# Patient Record
Sex: Female | Born: 2000 | Race: White | Hispanic: No | Marital: Single | Smoking: Former smoker
Health system: Southern US, Community
[De-identification: ages and names within clinical notes are randomized; demographics above are authoritative.]

## PROBLEM LIST (undated history)

## (undated) ENCOUNTER — Inpatient Hospital Stay (HOSPITAL_COMMUNITY): Payer: Self-pay

## (undated) DIAGNOSIS — B9562 Methicillin resistant Staphylococcus aureus infection as the cause of diseases classified elsewhere: Secondary | ICD-10-CM

## (undated) DIAGNOSIS — I10 Essential (primary) hypertension: Secondary | ICD-10-CM

## (undated) DIAGNOSIS — L0390 Cellulitis, unspecified: Secondary | ICD-10-CM

## (undated) DIAGNOSIS — F32A Depression, unspecified: Secondary | ICD-10-CM

## (undated) DIAGNOSIS — K219 Gastro-esophageal reflux disease without esophagitis: Secondary | ICD-10-CM

## (undated) DIAGNOSIS — T8859XA Other complications of anesthesia, initial encounter: Secondary | ICD-10-CM

## (undated) DIAGNOSIS — E209 Hypoparathyroidism, unspecified: Secondary | ICD-10-CM

## (undated) DIAGNOSIS — T4145XA Adverse effect of unspecified anesthetic, initial encounter: Secondary | ICD-10-CM

## (undated) DIAGNOSIS — E876 Hypokalemia: Secondary | ICD-10-CM

## (undated) DIAGNOSIS — E119 Type 2 diabetes mellitus without complications: Secondary | ICD-10-CM

## (undated) DIAGNOSIS — F329 Major depressive disorder, single episode, unspecified: Secondary | ICD-10-CM

## (undated) DIAGNOSIS — Z8489 Family history of other specified conditions: Secondary | ICD-10-CM

## (undated) DIAGNOSIS — G629 Polyneuropathy, unspecified: Secondary | ICD-10-CM

## (undated) DIAGNOSIS — I499 Cardiac arrhythmia, unspecified: Secondary | ICD-10-CM

## (undated) DIAGNOSIS — F419 Anxiety disorder, unspecified: Secondary | ICD-10-CM

## (undated) HISTORY — PX: OTHER SURGICAL HISTORY: SHX169

## (undated) HISTORY — PX: TYMPANOSTOMY TUBE PLACEMENT: SHX32

## (undated) HISTORY — PX: WISDOM TOOTH EXTRACTION: SHX21

## (undated) HISTORY — PX: CHOLECYSTECTOMY: SHX55

## (undated) HISTORY — DX: Hypokalemia: E87.6

## (undated) HISTORY — PX: ADENOIDECTOMY: SUR15

## (undated) HISTORY — PX: ESOPHAGOGASTRODUODENOSCOPY: SHX1529

## (undated) HISTORY — DX: Hypomagnesemia: E83.42

## (undated) HISTORY — DX: Cellulitis, unspecified: L03.90

## (undated) HISTORY — DX: Methicillin resistant Staphylococcus aureus infection as the cause of diseases classified elsewhere: B95.62

## (undated) HISTORY — DX: Polyneuropathy, unspecified: G62.9

## (undated) HISTORY — DX: Major depressive disorder, single episode, unspecified: F32.9

## (undated) HISTORY — PX: TOE SURGERY: SHX1073

## (undated) HISTORY — PX: COLONOSCOPY: SHX174

---
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
## 2020-01-09 ENCOUNTER — Other Ambulatory Visit: Payer: Self-pay | Admitting: Pediatrics

## 2020-01-09 MED ORDER — SERTRALINE HCL 50 MG PO TABS: 50.0000 mg | ORAL_TABLET | Freq: Every day | ORAL | 2 refills | Status: DC

## 2020-01-10 ENCOUNTER — Other Ambulatory Visit: Payer: Self-pay | Admitting: Pediatrics

## 2020-01-10 DIAGNOSIS — G479 Sleep disorder, unspecified: Secondary | ICD-10-CM

## 2020-01-15 ENCOUNTER — Ambulatory Visit: Payer: Medicaid Other | Admitting: Pediatrics

## 2020-01-16 ENCOUNTER — Ambulatory Visit (INDEPENDENT_AMBULATORY_CARE_PROVIDER_SITE_OTHER): Payer: Medicaid Other

## 2020-01-16 ENCOUNTER — Other Ambulatory Visit (HOSPITAL_COMMUNITY)
Admission: RE | Admit: 2020-01-16 | Discharge: 2020-01-16 | Disposition: A | Discharge status: Home / Self Care | Payer: Medicaid Other | Source: Ambulatory Visit | Attending: Pediatrics | Admitting: Pediatrics

## 2020-01-16 ENCOUNTER — Other Ambulatory Visit: Payer: Self-pay

## 2020-01-16 DIAGNOSIS — Z113 Encounter for screening for infections with a predominantly sexual mode of transmission: Secondary | ICD-10-CM | POA: Insufficient documentation

## 2020-01-16 DIAGNOSIS — Z3049 Encounter for surveillance of other contraceptives: Secondary | ICD-10-CM | POA: Diagnosis not present

## 2020-01-16 DIAGNOSIS — Z3042 Encounter for surveillance of injectable contraceptive: Secondary | ICD-10-CM

## 2020-01-16 MED ORDER — MEDROXYPROGESTERONE ACETATE 150 MG/ML IM SUSP
150.0000 mg | Freq: Once | INTRAMUSCULAR | Status: AC
  Administered 2020-01-16: 150 mg via INTRAMUSCULAR

## 2020-01-16 NOTE — Progress Notes (Signed)
Pt presents for depo injection. Pt within depo window, no urine hcg needed. Injection given, tolerated well. F/u depo injection visit scheduled. Also collected urine for routine GC/Chl testing.

## 2020-01-17 LAB — URINE CYTOLOGY ANCILLARY ONLY
Chlamydia: NEGATIVE
Comment: NEGATIVE
Comment: NORMAL
Neisseria Gonorrhea: NEGATIVE

## 2020-01-18 ENCOUNTER — Encounter (INDEPENDENT_AMBULATORY_CARE_PROVIDER_SITE_OTHER): Payer: Self-pay

## 2020-01-25 ENCOUNTER — Ambulatory Visit: Payer: Medicaid Other | Admitting: Neurology

## 2020-01-29 ENCOUNTER — Encounter (INDEPENDENT_AMBULATORY_CARE_PROVIDER_SITE_OTHER): Payer: Self-pay

## 2020-01-29 ENCOUNTER — Other Ambulatory Visit: Payer: Self-pay | Admitting: Family

## 2020-01-29 MED ORDER — SERTRALINE HCL 50 MG PO TABS: 50.0000 mg | ORAL_TABLET | Freq: Every day | ORAL | 0 refills | Status: DC

## 2020-02-06 ENCOUNTER — Ambulatory Visit: Payer: Medicaid Other | Admitting: Pediatrics

## 2020-02-15 ENCOUNTER — Other Ambulatory Visit: Payer: Self-pay

## 2020-02-15 ENCOUNTER — Encounter (INDEPENDENT_AMBULATORY_CARE_PROVIDER_SITE_OTHER): Payer: Self-pay | Admitting: Pediatric Endocrinology

## 2020-02-15 ENCOUNTER — Other Ambulatory Visit: Payer: Self-pay | Admitting: Pediatrics

## 2020-02-15 ENCOUNTER — Ambulatory Visit (INDEPENDENT_AMBULATORY_CARE_PROVIDER_SITE_OTHER): Payer: Medicaid Other | Admitting: Pediatric Endocrinology

## 2020-02-15 VITALS — BP 124/76 | Wt 160.6 lb

## 2020-02-15 DIAGNOSIS — E890 Postprocedural hypothyroidism: Secondary | ICD-10-CM | POA: Diagnosis not present

## 2020-02-15 DIAGNOSIS — E892 Postprocedural hypoparathyroidism: Secondary | ICD-10-CM | POA: Diagnosis not present

## 2020-02-15 DIAGNOSIS — E1065 Type 1 diabetes mellitus with hyperglycemia: Secondary | ICD-10-CM | POA: Diagnosis not present

## 2020-02-15 LAB — POCT GLYCOSYLATED HEMOGLOBIN (HGB A1C): HbA1c, POC (controlled diabetic range): 7.8 % — AB (ref 0.0–7.0)

## 2020-02-15 LAB — POCT GLUCOSE (DEVICE FOR HOME USE): POC Glucose: 118 mg/dl — AB (ref 70–99)

## 2020-02-15 MED ORDER — DULOXETINE HCL 20 MG PO CPEP: 20.0000 mg | ORAL_CAPSULE | Freq: Every day | ORAL | 2 refills | Status: DC

## 2020-02-15 NOTE — Progress Notes (Signed)
Subjective:  Subjective  Patient Name: Cassandra [name] Date of Birth: 09-Mar-2000  MRN: [id number]  Cassandra [name]  presents to the office today for follow up evaluation and management of her type 1 diabetes and hypoglycemia, hypothyroidism, hypoparathyroidism  HISTORY OF PRESENT ILLNESS:   Cassandra [name] is a 20 y.o. Caucasian female   Cassandra [name] came to her appointment alone.   1. Cassandra [name] was seen in the hospital at [facility] pediatrics on October 12-15. She was admitted with hypoglycemia. She had previously been followed for her diabetes care at [facility] was diagnosed with type 1 diabetes at age 60. She was having leg cramps and she was having polyuria/polydipsia. Her BG at the PCP office was 564 mg/dL. She was sent to [facility]. She was admitted there for 3 days for initial diabetes education. She transitioned care to [facility] in 2018. She started on a T-Slim insulin pump 12/09/17.   2. Cassandra [name] was last seen in pediatric endocrine clinic on 11/14/19  Post surgical hypoparathyroidism   1) Calcium 2 tabs/2 tabs/2 tabs/ 3 tabs daily to keep total calcium at 4.5 grams per day (OsCal 500/200). Need to take with food (9 tablets per day) - she has them in a pill sorter   2) Calcitriol 4 tabs with breakfast and 3 with dinner   3) HCTZ 50 mg twice a day  4) She has been getting 1000 iu of vit D   5) She is taking Synthroid 175 mcg once a day.   Appetite has improved. She has been working with a new GI doctor and is not having as much diarrhea. They are calling it IBD.   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 She says that they have continued to monitor her regularly.   Thyroid/ post surgical hypothyroidism   Currently on 175 mcg LT4 daily.    She feels that her energy level is better than it used to be. She has recently started a job after 1 year at home and is regaining her endurance.   Type 1 diabetes, with hyperglycemia/hypoglycemia,  uncontrolled   She has a new Tandem pump and is in Control IQ. She really likes the new features and that her pump actually works. She is having some recent hypoglycemia at work now that she has resumed going to work. She is working a Restaurant manager, fast food position and is not doing any lifting- but she does run around and that drops her sugar.   She is getting 35+ units a day of basal compared with 30.1 units in her programmed settings.   Lows appear to be associated with carb insulin doses.    3. Pertinent Review of Systems:  Constitutional: The patient feels "good/sleepy". The patient seems healthy and active.  Eyes: Vision seems to be good. There are no recognized eye problems. Wears glasses- got them fixed- they are at home. Saw Ophthalmology April 2021 Neck: The patient has no complaints of anterior neck swelling, soreness, tenderness, pressure, discomfort, or difficulty swallowing.   Heart: Heart rate increases with exercise or other physical activity. The patient has no complaints of palpitations, irregular heart beats, chest pain, or chest pressure.   Lungs: no asthma or wheezing.  Gastrointestinal: IBD followed by adult GI Legs: Muscle mass and strength seem normal. There are no complaints of numbness, tingling, burning, or pain. No edema is noted.  Feet: There are no obvious foot problems. There are no complaints of numbness, tingling, burning, or pain. No edema is noted. Neurologic: There  are no recognized problems with muscle movement and strength, sensation, or coordination. Neuropathy in both feet. Sensation loss in right foot.  Intermittent pain in hands and feet. Followed by adult Neurology. Scheduled in February.  GYN/GU:  Has had menorrhagia on Nexplanon. Now on Depo Provera Diabetes alert: Tattoo right wrist.   Annual labs: [name] Last done Jan 2021 - Due today    CGM Download:_     Tandem:          PAST MEDICAL, FAMILY, AND SOCIAL HISTORY  Past Medical History:  Diagnosis  Date  . Anxiety   . Complication of anesthesia    woke up during Colonoscopy and EGD  . Depression   . Diabetes mellitus without complication ([place])   . Dysrhythmia    Tachy  . Family history of adverse reaction to anesthesia    Mom woke up during surgery.  [name] GERD (gastroesophageal reflux disease)   . Hypertension   . Neuropathy    Feet, legs, hands    Family History  Problem Relation Age of Onset  . Hypertension Maternal Grandmother   . Anxiety disorder Maternal Grandmother   . ADD / ADHD Maternal Grandmother   . Hyperlipidemia Maternal Grandfather   . Hyperlipidemia Mother   . Anxiety disorder Mother   . Hyperlipidemia Brother   . Migraines Neg Hx   . Seizures Neg Hx   . Depression Neg Hx   . Autism Neg Hx   . Bipolar disorder Neg Hx   . Schizophrenia Neg Hx      Current Outpatient Medications:  .  amitriptyline (ELAVIL) 75 MG tablet, TAKE 1 TABLET BY MOUTH AT BEDTIME., Disp: 90 tablet, Rfl: 2 .  Blood Glucose Monitoring Suppl (ACCU-CHEK GUIDE) w/Device KIT, 1 kit by Does not apply route daily as needed., Disp: 2 kit, Rfl: 5 .  buPROPion (WELLBUTRIN XL) 300 MG 24 hr tablet, Take 1 tablet (300 mg total) by mouth every morning., Disp: 30 tablet, Rfl: 2 .  calcitRIOL (ROCALTROL) 0.25 MCG capsule, TAKE 5 TABLETS IN THE AM AND 4 TABLETS IN THE PM, Disp: 270 capsule, Rfl: 5 .  calcium-vitamin D (OSCAL WITH D) 500-200 MG-UNIT tablet, Take 4 tablets by mouth 3 (three) times daily., Disp: 360 tablet, Rfl: 0 .  Continuous Blood Gluc Receiver (DEXCOM G6 RECEIVER) DEVI, 1 Device by Does not apply route daily as needed., Disp: 1 each, Rfl: 0 .  Continuous Blood Gluc Sensor (DEXCOM G6 SENSOR) MISC, 3 kits by Does not apply route daily as needed (Change sensor every 10 days)., Disp: 3 each, Rfl: 5 .  Continuous Blood Gluc Transmit (DEXCOM G6 TRANSMITTER) MISC, 1 kit by Does not apply route daily as needed., Disp: 1 each, Rfl: 1 .  fluticasone (FLONASE) 50 MCG/ACT nasal spray, Place 1  spray into both nostrils 2 (two) times daily as needed for allergies or rhinitis., Disp: , Rfl:  .  glucose blood (ACCU-CHEK GUIDE) test strip, Check glucose 6x daily, Disp: 600 each, Rfl: 1 .  glucose blood (ACCU-CHEK GUIDE) test strip, Use to check sugars 6X daily, Disp: 600 each, Rfl: 1 .  hydrochlorothiazide (HYDRODIURIL) 50 MG tablet, Take 1 tablet (50 mg total) by mouth 2 (two) times daily., Disp: 60 tablet, Rfl: 3 .  insulin lispro (HUMALOG) 100 UNIT/ML injection, UP TO 300 UNITS IN INSULIN PUMP EVERY 48 HOURS, PER DKA AND HYPERGLYCEMIA PROTOCOLS, Disp: 40 mL, Rfl: 5 .  Insulin Syringe-Needle U-100 (INSULIN SYRINGE .3CC/29GX1/2") 29G X 1/2" 0.3 ML MISC, 1 each  by Does not apply route 6 (six) times daily., Disp: 200 each, Rfl: 6 .  levocetirizine (XYZAL) 5 MG tablet, Take 5 mg by mouth every evening., Disp: , Rfl:  .  levothyroxine (SYNTHROID) 175 MCG tablet, Take 1 tablet (175 mcg total) by mouth daily at 6 (six) AM, Disp: 30 tablet, Rfl: 5 .  loratadine (CLARITIN) 10 MG tablet, Take 10 mg by mouth daily., Disp: , Rfl:  .  pantoprazole (PROTONIX) 40 MG tablet, Take 40 mg by mouth at bedtime., Disp: , Rfl:  .  sertraline (ZOLOFT) 50 MG tablet, Take 1 tablet (50 mg total) by mouth daily., Disp: 90 tablet, Rfl: 0 .  DULoxetine (CYMBALTA) 20 MG capsule, Take 1 capsule (20 mg total) by mouth daily., Disp: 30 capsule, Rfl: 2 .  Glucagon (BAQSIMI TWO PACK) 3 MG/DOSE POWD, Place 3 mg into the nose once as needed for up to 1 dose (for severe hypoglycemia when patient is unconcious). (Patient not taking: Reported on 02/15/2020), Disp: 2 each, Rfl: 3 .  metoprolol tartrate (LOPRESSOR) 25 MG tablet, Take 25 mg by mouth 2 (two) times daily.  (Patient not taking: Reported on 02/15/2020), Disp: , Rfl:  .  Potassium Gluconate 2.5 MEQ TABS, Take by mouth. (Patient not taking: Reported on 02/15/2020), Disp: , Rfl:   Current Facility-Administered Medications:  .  medroxyPROGESTERone (DEPO-PROVERA) injection 150  mg, 150 mg, Intramuscular, Once, Cassandra Ames, NP  Allergies as of 02/15/2020 - Review Complete 02/15/2020  Allergen Reaction Noted  . Amoxicillin-pot clavulanate Diarrhea and Nausea And Vomiting 11/14/2016  . Fish allergy Diarrhea 11/14/2016  . Lactose intolerance (gi) Diarrhea 11/14/2016  . Shellfish-derived products Diarrhea and Nausea And Vomiting 11/14/2016  . Tape Itching 11/14/2016  . Lactase Nausea And Vomiting 03/07/2013  . Latex Rash 11/14/2016     reports that she quit smoking about 12 months ago. She has a 0.65 pack-year smoking history. She has never used smokeless tobacco. She reports that she does not drink alcohol and does not use drugs. Pediatric History  Patient Parents  . Roman,Rebecca (Mother)   Other Topics Concern  . Not on file  Social History Narrative   Pt lives with mother. Mother is a Quarry manager. When not at home, someone is home with patient at all times. They have two dogs and three cats. Patient is graduate from [employer]; doing to cosmetology school. Pt enjoys softball, sleep, and be with family.     1. School and Family: Graduated HS.. Currently with mom.   2. Activities: Will not age out of Medicaid at 60  Working at [employer] 3. Primary Care Provider: Aquilla Hacker, MD  ROS: There are no other significant problems involving Cassandra [name]'s other body systems.    Objective:  Objective  Vital Signs:  BP 124/76   Wt 160 lb 9.6 oz (72.8 kg)   BMI 31.12 kg/m   Blood pressure percentiles are not available for patients who are 18 years or older.  Ht Readings from Last 3 Encounters:  12/18/19 5' 0.24" (1.53 m) (6 %, Z= -1.59)*  04/13/19 5' 0.63" (1.54 m) (8 %, Z= -1.43)*  03/09/19 5' 0.43" (1.535 m) (7 %, Z= -1.50)*   * Growth percentiles are based on CDC (Girls, 2-20 Years) data.   Wt Readings from Last 3 Encounters:  02/15/20 160 lb 9.6 oz (72.8 kg) (87 %, Z= 1.15)*  12/18/19 155 lb 9.6 oz (70.6 kg) (85 %, Z= 1.02)*  11/14/19  153 lb 12.8 oz (69.8 kg) (  84 %, Z= 0.97)*   * Growth percentiles are based on CDC (Girls, 2-20 Years) data.   HC Readings from Last 3 Encounters:  No data found for [facility]   Body surface area is 1.76 meters squared. No height on file for this encounter. 87 %ile (Z= 1.15) based on CDC (Girls, 2-20 Years) weight-for-age data using vitals from 02/15/2020.  PHYSICAL EXAM:    Constitutional: The patient appears healthy and well nourished. The patient's height and weight are overweight for age.  Weight is +7 pounds from last visit Head: The head is normocephalic.  Face: The face appears normal. There are no obvious dysmorphic features. Eyes: The eyes appear to be normally formed and spaced. Gaze is conjugate. There is no obvious arcus or proptosis. Moisture appears normal.  Ears: The ears are normally placed and appear externally normal. Mouth: The oropharynx and tongue appear normal. Dentition appears to be normal for age. Oral moisture is normal. Neck: The neck appears to be visibly normal. Well healed surgical scar.  Lungs: Normal work of breathing Heart: good pulses and peripheral perfusion.  Abdomen: The abdomen appears to be normal in size for the patient's age. Bowel sounds are normal. There is no obvious hepatomegaly, splenomegaly, or other mass effect.  Arms: Muscle size and bulk are normal for age. Hands: There is no obvious tremor. Phalangeal and metacarpophalangeal joints are normal. Palmar muscles are normal for age. Palmar skin is normal. Palmar moisture is also normal. Legs: Muscles appear normal for age. No edema is present.  Feet: Feet are normally formed. Dorsalis pedal pulses are normal. Neurologic: Strength is normal for age in both the upper and lower extremities. Muscle tone is normal. No twitch or spasms.  GYN/GU: normal female  LAB DATA:    Lab Results  Component Value Date   HGBA1C 7.8 (A) 02/15/2020   HGBA1C 9.0 (A) 11/14/2019   HGBA1C 10.2 (A) 06/19/2019   HGBA1C  8.9 (A) 03/13/2019   HGBA1C 8.8 (A) 11/28/2018   HGBA1C 8.2 (A) 08/24/2018   HGBA1C 8.7 (A) 05/09/2018   HGBA1C 9.4 (A) 02/01/2018   HGBA1C 8.0 (H) 01/10/2018   HGBA1C 7.9 (H) 11/01/2017   HGBA1C 7.8 (H) 10/14/2017   HGBA1C 7.5 (A) 09/15/2017   HGBA1C 8.6 05/18/2017   HGBA1C 9.1 02/24/2017   HGBA1C 8.2 11/19/2016   Lab Results  Component Value Date   CALCIUM 8.7 (L) 12/25/2019   CALCIUM 8.3 (L) 12/08/2019   CALCIUM 8.7 (L) 11/29/2019   CALCIUM 7.4 (L) 11/20/2019   CALCIUM 7.7 (L) 10/31/2019   CALCIUM 7.9 (L) 10/13/2019   CALCIUM 8.3 (L) 09/07/2019   CALCIUM 8.1 (L) 08/16/2019   CALCIUM 7.3 (L) 07/21/2019   CALCIUM 7.4 (L) 06/28/2019   CALCIUM 7.2 (L) 06/26/2019   CALCIUM 7.8 (L) 06/26/2019   CALCIUM 7.3 (L) 06/19/2019   CALCIUM 7.7 (L) 06/07/2019   CALCIUM 7.0 (L) 05/29/2019   CALCIUM 7.2 (L) 05/16/2019   CALCIUM 7.2 (L) 05/16/2019   CALCIUM 7.4 (L) 05/04/2019   CALCIUM 6.9 (L) 04/24/2019   CALCIUM 6.6 (L) 04/17/2019    Results for orders placed or performed in visit on 02/15/20  POCT Glucose (Device for Home Use)  Result Value Ref Range   Glucose Fasting, POC     POC Glucose 118 (A) 70 - 99 mg/dl  POCT glycosylated hemoglobin (Hb A1C)  Result Value Ref Range   Hemoglobin A1C     HbA1c POC (<> result, manual entry)     HbA1c, POC (prediabetic  range)     HbA1c, POC (controlled diabetic range) 7.8 (A) 0.0 - 7.0 %       Assessment and Plan:  Assessment  ASSESSMENT: Cassandra [name] is a 20 y.o. Caucasian female with type 1 diabetes, post surgical hypothyroidism, and post surgical hypoparathyroidism complicated by hypocalcemia   Post surgical hypothyroidism  - On LT4 175 mcg daily - Clinically euthyroid - Repeat thyroid levels today  Post surgical hypocalcemia/hypoparathyroidism - Calcium has steadily improved - She is not symptomatic  - Continues on Rocaltrol and Vit D3 replacement -Rocaltrol  3 tabs AM and 2 tabs PM (0.25 mcg)  -D3 1000 IU/day - HCTZ 50 mg  BID - Repeat levels today  Diabetes, uncontrolled, with hyper and hypoglycemia- did not address diabetes today - on T-Slim pump and Dexcom - T slim pump was replaced in October - She is now on Control IQ and doing well - No recent severe hypoglycemia - She wants to wait until after her visit at [place] to adjust pump settings. She is hoping to get her pump at least back on Basal IQ.   Pump settings:  Time Basal Rate Correction factor Carb ratio Target BG   12a 1.35 30 mg/dL 10 180 mg/dL  4a 1.2 30 mg/dL 10 180 mg/dL  6a 1.25 30 mg/dL 10 120 mg/dL  8a 1.2 30 mg/dL 8 -> 10 120 mg/dL  3pm 1.25 41m/dL 8 -> 10 1271mdL  9p 1.3 30 mg/dL 10 180 mg/dL   Total Basal   30.1      Duration of insulin   3 hours     Maximum Bolus 12 Units     Carb (for calculation) On      Low Insulin Alert On- 30 Units      Auto Off Off     Quick Bolus Off     Basal IQ On       Memory loss/neuropathy  - she has seen neurology for her neuropathy and is now meant to be on Neurontin- but she could not swallow it.  - she is unsure about her memory loss. Referred for neurocognitive testing. Has not scheduled since turning 18. Says that she still is struggling with this.  - [facility] Has scheduled visit with Dr. JaTomi Likensbut it had to be rescheduled so it is not until next month.   Hypertension:  -No longer taking Lisinopril -BP normal today - On HCTZ to decrease calcium reabsorption in the kidneys   Follow-up: Return in about 3 months (around 05/15/2020).      JeLelon HuhMD  >40 minutes spent today reviewing the medical chart, counseling the patient/family, and documenting today's encounter. When a patient is on insulin, intensive monitoring of blood glucose levels is necessary to avoid hyperglycemia and hypoglycemia. Severe hyperglycemia/hypoglycemia can lead to hospital admissions and be life threatening.    Patient referred by  VaAquilla HackerMD for type 1 diabetes.   Copy of this note sent to VaAquilla HackerMD

## 2020-02-15 NOTE — Patient Instructions (Signed)
No changes to meds pending labs.   Change your carb ratio during the day from 1:8 to 1:10

## 2020-02-20 LAB — MICROALBUMIN / CREATININE URINE RATIO
Creatinine, Urine: 45 mg/dL (ref 20–275)
Microalb Creat Ratio: 4 mcg/mg creat (ref <30)
Microalb, Ur: 0.2 mg/dL

## 2020-02-20 LAB — COMPREHENSIVE METABOLIC PANEL
AG Ratio: 1.5 (calc) (ref 1.0–2.5)
ALT: 14 U/L (ref 5–32)
AST: 17 U/L (ref 12–32)
Albumin: 4.7 g/dL (ref 3.6–5.1)
Alkaline phosphatase (APISO): 92 U/L (ref 36–128)
BUN: 8 mg/dL (ref 7–20)
CO2: 25 mmol/L (ref 20–32)
Calcium: 9.1 mg/dL (ref 8.9–10.4)
Chloride: 102 mmol/L (ref 98–110)
Creat: 0.83 mg/dL (ref 0.50–1.00)
Globulin: 3.2 g/dL (calc) (ref 2.0–3.8)
Glucose, Bld: 86 mg/dL (ref 65–139)
Potassium: 3.9 mmol/L (ref 3.8–5.1)
Sodium: 140 mmol/L (ref 135–146)
Total Bilirubin: 0.5 mg/dL (ref 0.2–1.1)
Total Protein: 7.9 g/dL (ref 6.3–8.2)

## 2020-02-20 LAB — PTH, INTACT AND CALCIUM
Calcium: 9.1 mg/dL (ref 8.9–10.4)
PTH: 9 pg/mL — ABNORMAL LOW (ref 14–64)

## 2020-02-20 LAB — T4, FREE: Free T4: 1.4 ng/dL (ref 0.8–1.4)

## 2020-02-20 LAB — TSH: TSH: 4.89 mIU/L — ABNORMAL HIGH

## 2020-02-20 LAB — MAGNESIUM: Magnesium: 2 mg/dL (ref 1.5–2.5)

## 2020-02-20 LAB — VITAMIN D 1,25 DIHYDROXY
Vitamin D 1, 25 (OH)2 Total: 18 pg/mL (ref 18–72)
Vitamin D2 1, 25 (OH)2: <8 pg/mL
Vitamin D3 1, 25 (OH)2: 18 pg/mL

## 2020-02-20 LAB — PHOSPHORUS: Phosphorus: 6.3 mg/dL — ABNORMAL HIGH (ref 2.7–5.0)

## 2020-02-20 LAB — VITAMIN D 25 HYDROXY (VIT D DEFICIENCY, FRACTURES): Vit D, 25-Hydroxy: 28 ng/mL — ABNORMAL LOW (ref 30–100)

## 2020-03-06 ENCOUNTER — Other Ambulatory Visit: Payer: Self-pay | Admitting: Family

## 2020-03-06 DIAGNOSIS — F4322 Adjustment disorder with anxiety: Secondary | ICD-10-CM

## 2020-03-11 ENCOUNTER — Encounter (INDEPENDENT_AMBULATORY_CARE_PROVIDER_SITE_OTHER): Payer: Self-pay

## 2020-03-11 NOTE — Telephone Encounter (Signed)
CALLED AND NO ANSWER 

## 2020-03-14 ENCOUNTER — Other Ambulatory Visit: Payer: Self-pay | Admitting: Pediatrics

## 2020-03-18 ENCOUNTER — Encounter (INDEPENDENT_AMBULATORY_CARE_PROVIDER_SITE_OTHER): Payer: Self-pay

## 2020-03-20 NOTE — Progress Notes (Deleted)
NEUROLOGY CONSULTATION NOTE  Cassandra [name] MRN: [id number] DOB: 09-21-2000  Referring provider: Lelon Huh, MD Primary care provider: Aquilla Hacker, MD  Reason for consult:  Migraine, neuropathy   Subjective:  Cassandra [name] is a 20 year old ***-handed female with type 1 diabetes with neuropathy, acquired hypthyroidism and depression who presents for migraines and neuropathy.  History supplemented by referring provider's notes.  MIGRAINES: Onset:  *** Location:  *** Quality:  *** Intensity:  ***.  *** denies new headache, thunderclap headache or severe headache that wakes *** from sleep. Aura:  *** Prodrome:  *** Postdrome:  *** Associated symptoms:  ***.  *** denies associated unilateral numbness or weakness. Duration:  *** Frequency:  *** Frequency of abortive medication: *** Triggers:  *** Relieving factors:  *** Activity:  ***   NEUROPATHY: She has uncontrolled type 1 diabetes.  ***  Current NSAIDS/analgesics:  *** Current triptans:  none Current ergotamine:  none Current anti-emetic:  none Current muscle relaxants:  none Current Antihypertensive medications:  HCTZ,, Lopressor Current Antidepressant medications:  Amitriptyline 45m at bedtime, Cymbalta 216mdaily, sertraline 5080mWellbutrin Current Anticonvulsant medications:  none Current anti-CGRP:  none Current Vitamins/Herbal/Supplements:  K Current Antihistamines/Decongestants:  Xyzal, Claritin Other therapy:  *** Other medications:  Synthroid  Past NSAIDS/analgesics:  Excedrin Migraine, naproxen Past abortive triptans:  Sumatriptan 47m74mst abortive ergotamine:  none Past muscle relaxants:  tizanidine Past anti-emetic:  promethazine Past antihypertensive medications:  lisinopril Past antidepressant medications:  *** Past anticonvulsant medications:  gabapentin Past anti-CGRP:  none Past vitamins/Herbal/Supplements:  Magnesium oxide Past antihistamines/decongestants:  *** Other  past therapies:  ***  Caffeine:  *** Alcohol:  *** Smoker:  *** Diet:  *** Exercise:  *** Depression:  ***; Anxiety:  *** Other pain:  *** Sleep hygiene:  *** Family history of headache:  ***     PAST MEDICAL HISTORY: Past Medical History:  Diagnosis Date  . Anxiety   . Complication of anesthesia    woke up during Colonoscopy and EGD  . Depression   . Diabetes mellitus without complication (HCC)[place]. Dysrhythmia    Tachy  . Family history of adverse reaction to anesthesia    Mom woke up during surgery.  . GEMarland KitchenD (gastroesophageal reflux disease)   . Hypertension   . Neuropathy    Feet, legs, hands    PAST SURGICAL HISTORY: Past Surgical History:  Procedure Laterality Date  . ADENOIDECTOMY    . COLONOSCOPY    . ESOPHAGOGASTRODUODENOSCOPY    . THYROIDECTOMY N/A 01/25/2019   Procedure: COMPLETE THYROIDECTOMY;  Surgeon: TeohLeta Baptist;  [facility] [place] [place];  Laterality: N/A;  . TOE SURGERY Bilateral    for ingrown toenails  . TYMPANOSTOMY TUBE PLACEMENT    . WISDOM TOOTH EXTRACTION      MEDICATIONS: Current Outpatient Medications on File Prior to Visit  Medication Sig Dispense Refill  . amitriptyline (ELAVIL) 75 MG tablet TAKE 1 TABLET BY MOUTH AT BEDTIME. 90 tablet 2  . Blood Glucose Monitoring Suppl (ACCU-CHEK GUIDE) w/Device KIT 1 kit by Does not apply route daily as needed. 2 kit 5  . buPROPion (WELLBUTRIN XL) 150 MG 24 hr tablet TAKE 1 TABLET BY MOUTH EVERY DAY 90 tablet 0  . buPROPion (WELLBUTRIN XL) 300 MG 24 hr tablet Take 1 tablet (300 mg total) by mouth every morning. 30 tablet 2  . calcitRIOL (ROCALTROL) 0.25 MCG capsule TAKE 5 TABLETS IN THE AM AND 4 TABLETS IN THE PM 270  capsule 5  . calcium-vitamin D (OSCAL WITH D) 500-200 MG-UNIT tablet Take 4 tablets by mouth 3 (three) times daily. 360 tablet 0  . Continuous Blood Gluc Receiver (DEXCOM G6 RECEIVER) DEVI 1 Device by Does not apply route daily as needed. 1 each 0  . Continuous Blood Gluc Sensor  (DEXCOM G6 SENSOR) MISC 3 kits by Does not apply route daily as needed (Change sensor every 10 days). 3 each 5  . Continuous Blood Gluc Transmit (DEXCOM G6 TRANSMITTER) MISC 1 kit by Does not apply route daily as needed. 1 each 1  . DULoxetine (CYMBALTA) 20 MG capsule TAKE 1 CAPSULE BY MOUTH EVERY DAY 90 capsule 1  . fluticasone (FLONASE) 50 MCG/ACT nasal spray Place 1 spray into both nostrils 2 (two) times daily as needed for allergies or rhinitis.    . Glucagon (BAQSIMI TWO PACK) 3 MG/DOSE POWD Place 3 mg into the nose once as needed for up to 1 dose (for severe hypoglycemia when patient is unconcious). (Patient not taking: Reported on 02/15/2020) 2 each 3  . glucose blood (ACCU-CHEK GUIDE) test strip Check glucose 6x daily 600 each 1  . glucose blood (ACCU-CHEK GUIDE) test strip Use to check sugars 6X daily 600 each 1  . hydrochlorothiazide (HYDRODIURIL) 50 MG tablet Take 1 tablet (50 mg total) by mouth 2 (two) times daily. 60 tablet 3  . insulin lispro (HUMALOG) 100 UNIT/ML injection UP TO 300 UNITS IN INSULIN PUMP EVERY 48 HOURS, PER DKA AND HYPERGLYCEMIA PROTOCOLS 40 mL 5  . Insulin Syringe-Needle U-100 (INSULIN SYRINGE .3CC/29GX1/2") 29G X 1/2" 0.3 ML MISC 1 each by Does not apply route 6 (six) times daily. 200 each 6  . levocetirizine (XYZAL) 5 MG tablet Take 5 mg by mouth every evening.    [name] levothyroxine (SYNTHROID) 175 MCG tablet Take 1 tablet (175 mcg total) by mouth daily at 6 (six) AM 30 tablet 5  . loratadine (CLARITIN) 10 MG tablet Take 10 mg by mouth daily.    . metoprolol tartrate (LOPRESSOR) 25 MG tablet Take 25 mg by mouth 2 (two) times daily.  (Patient not taking: Reported on 02/15/2020)    . pantoprazole (PROTONIX) 40 MG tablet Take 40 mg by mouth at bedtime.    . Potassium Gluconate 2.5 MEQ TABS Take by mouth. (Patient not taking: Reported on 02/15/2020)    . sertraline (ZOLOFT) 50 MG tablet Take 1 tablet (50 mg total) by mouth daily. 90 tablet 0   Current Facility-Administered  Medications on File Prior to Visit  Medication Dose Route Frequency Provider Last Rate Last Admin  . medroxyPROGESTERone (DEPO-PROVERA) injection 150 mg  150 mg Intramuscular Once Parthenia Ames, NP        ALLERGIES: Allergies  Allergen Reactions  . Amoxicillin-Pot Clavulanate Diarrhea and Nausea And Vomiting    Did it involve swelling of the face/tongue/throat, SOB, or low BP? No Did it involve sudden or severe rash/hives, skin peeling, or any reaction on the inside of your mouth or nose? No Did you need to seek medical attention at a hospital or doctor's office? No When did it last happen?Few years ago If all above answers are "NO", may proceed with cephalosporin use.  . Fish Allergy Diarrhea  . Lactose Intolerance (Gi) Diarrhea  . Shellfish-Derived Products Diarrhea and Nausea And Vomiting  . Tape Itching    Medical tape causes itching  . Lactase Nausea And Vomiting  . Latex Rash    FAMILY HISTORY: Family History  Problem Relation Age  of Onset  . Hypertension Maternal Grandmother   . Anxiety disorder Maternal Grandmother   . ADD / ADHD Maternal Grandmother   . Hyperlipidemia Maternal Grandfather   . Hyperlipidemia Mother   . Anxiety disorder Mother   . Hyperlipidemia Brother   . Migraines Neg Hx   . Seizures Neg Hx   . Depression Neg Hx   . Autism Neg Hx   . Bipolar disorder Neg Hx   . Schizophrenia Neg Hx    ***.  SOCIAL HISTORY: Social History   Socioeconomic History  . Marital status: Single    Spouse name: Not on file  . Number of children: Not on file  . Years of education: Not on file  . Highest education level: Not on file  Occupational History  . Not on file  Tobacco Use  . Smoking status: Former Smoker    Packs/day: 0.13    Years: 5.00    Pack years: 0.65    Quit date: 01/23/2019    Years since quitting: 1.1  . Smokeless tobacco: Never Used  Vaping Use  . Vaping Use: Never used  Substance and Sexual Activity  . Alcohol use: No  . Drug  use: No  . Sexual activity: Not Currently    Birth control/protection: Implant    Comment: Nexplanon  Other Topics Concern  . Not on file  Social History Narrative   Pt lives with mother. Mother is a Quar[employer] manager. When not at home, someone is home with patient at all times. They have two dogs and three cats. Patient is graduate from [employer]; doing to cosmetology school. Pt enjoys softball, sleep, and be with family.    Social Determinants of Health   Financial Resource Strain: Not on file  Food Insecurity: Not on file  Transportation Needs: Not on file  Physical Activity: Not on file  Stress: Not on file  Social Connections: Not on file  Intimate Partner Violence: Not on file    Objective:  *** General: No acute distress.  Patient appears well-groomed.   Head:  Normocephalic/atraumatic Eyes:  fundi examined but not visualized Neck: supple, no paraspinal tenderness, full range of motion Back: No paraspinal tenderness Heart: regular rate and rhythm Lungs: Clear to auscultation bilaterally. Vascular: No carotid bruits. Neurological Exam: Mental status: alert and oriented to person, place, and time, recent and remote memory intact, fund of knowledge intact, attention and concentration intact, speech fluent and not dysarthric, language intact. Cranial nerves: CN I: not tested CN II: pupils equal, round and reactive to light, visual fields intact CN III, IV, VI:  full range of motion, no nystagmus, no ptosis CN V: facial sensation intact. CN VII: upper and lower face symmetric CN VIII: hearing intact CN IX, X: gag intact, uvula midline CN XI: sternocleidomastoid and trapezius muscles intact CN XII: tongue midline Bulk & Tone: normal, no fasciculations. Motor:  muscle strength 5/5 throughout Sensation:  Pinprick, temperature and vibratory sensation intact. Deep Tendon Reflexes:  2+ throughout,  toes downgoing.   Finger to nose testing:  Without dysmetria.   Heel  to shin:  Without dysmetria.   Gait:  Normal station and stride.  Romberg negative.  Assessment/Plan:   ***    Thank you for allowing me to take part in the care of this patient.  Metta Clines, DO  CC: ***

## 2020-03-21 ENCOUNTER — Ambulatory Visit: Payer: Medicaid Other | Admitting: Neurology

## 2020-04-02 ENCOUNTER — Ambulatory Visit: Payer: Medicaid Other

## 2020-04-08 ENCOUNTER — Encounter (INDEPENDENT_AMBULATORY_CARE_PROVIDER_SITE_OTHER): Payer: Self-pay

## 2020-04-08 DIAGNOSIS — E892 Postprocedural hypoparathyroidism: Secondary | ICD-10-CM

## 2020-04-08 DIAGNOSIS — E890 Postprocedural hypothyroidism: Secondary | ICD-10-CM

## 2020-04-10 ENCOUNTER — Encounter (INDEPENDENT_AMBULATORY_CARE_PROVIDER_SITE_OTHER): Payer: Self-pay

## 2020-04-11 ENCOUNTER — Other Ambulatory Visit (INDEPENDENT_AMBULATORY_CARE_PROVIDER_SITE_OTHER): Payer: Self-pay | Admitting: Pediatric Endocrinology

## 2020-04-11 DIAGNOSIS — E890 Postprocedural hypothyroidism: Secondary | ICD-10-CM

## 2020-04-11 MED ORDER — LEVOTHYROXINE SODIUM 150 MCG PO TABS
150.0000 ug | ORAL_TABLET | Freq: Every day | ORAL | 11 refills | Status: DC
Start: 2020-04-11 — End: 2021-02-10

## 2020-04-12 ENCOUNTER — Encounter (INDEPENDENT_AMBULATORY_CARE_PROVIDER_SITE_OTHER): Payer: Self-pay

## 2020-04-13 LAB — VITAMIN D 25 HYDROXY (VIT D DEFICIENCY, FRACTURES): Vit D, 25-Hydroxy: 28 ng/mL — ABNORMAL LOW (ref 30–100)

## 2020-04-13 LAB — COMPREHENSIVE METABOLIC PANEL
AG Ratio: 1.6 (calc) (ref 1.0–2.5)
ALT: 12 U/L (ref 6–29)
AST: 15 U/L (ref 10–30)
Albumin: 4.4 g/dL (ref 3.6–5.1)
Alkaline phosphatase (APISO): 76 U/L (ref 31–125)
BUN: 8 mg/dL (ref 7–25)
CO2: 21 mmol/L (ref 20–32)
Calcium: 7 mg/dL — ABNORMAL LOW (ref 8.6–10.2)
Chloride: 107 mmol/L (ref 98–110)
Creat: 0.74 mg/dL (ref 0.50–1.10)
Globulin: 2.7 g/dL (calc) (ref 1.9–3.7)
Glucose, Bld: 138 mg/dL (ref 65–139)
Potassium: 4 mmol/L (ref 3.5–5.3)
Sodium: 140 mmol/L (ref 135–146)
Total Bilirubin: 0.5 mg/dL (ref 0.2–1.2)
Total Protein: 7.1 g/dL (ref 6.1–8.1)

## 2020-04-13 LAB — VITAMIN D 1,25 DIHYDROXY
Vitamin D 1, 25 (OH)2 Total: 36 pg/mL (ref 18–72)
Vitamin D2 1, 25 (OH)2: <8 pg/mL
Vitamin D3 1, 25 (OH)2: 36 pg/mL

## 2020-04-13 LAB — PHOSPHORUS: Phosphorus: 5 mg/dL (ref 2.7–5.0)

## 2020-04-13 LAB — MAGNESIUM: Magnesium: 1.8 mg/dL (ref 1.5–2.5)

## 2020-04-13 LAB — T4, FREE: Free T4: 1.9 ng/dL — ABNORMAL HIGH (ref 0.8–1.4)

## 2020-04-13 LAB — TSH: TSH: 0.3 mIU/L — ABNORMAL LOW

## 2020-04-15 ENCOUNTER — Other Ambulatory Visit (INDEPENDENT_AMBULATORY_CARE_PROVIDER_SITE_OTHER): Payer: Self-pay | Admitting: Pediatric Endocrinology

## 2020-04-15 ENCOUNTER — Encounter (INDEPENDENT_AMBULATORY_CARE_PROVIDER_SITE_OTHER): Payer: Self-pay

## 2020-04-15 DIAGNOSIS — E892 Postprocedural hypoparathyroidism: Secondary | ICD-10-CM

## 2020-04-16 ENCOUNTER — Encounter (INDEPENDENT_AMBULATORY_CARE_PROVIDER_SITE_OTHER): Payer: Self-pay

## 2020-04-16 LAB — CALCIUM: Calcium: 8.6 mg/dL (ref 8.6–10.2)

## 2020-04-16 LAB — MAGNESIUM: Magnesium: 1.9 mg/dL (ref 1.5–2.5)

## 2020-04-16 LAB — PHOSPHORUS: Phosphorus: 6 mg/dL — ABNORMAL HIGH (ref 2.7–5.0)

## 2020-04-17 ENCOUNTER — Other Ambulatory Visit (INDEPENDENT_AMBULATORY_CARE_PROVIDER_SITE_OTHER): Payer: Self-pay

## 2020-04-17 ENCOUNTER — Encounter (INDEPENDENT_AMBULATORY_CARE_PROVIDER_SITE_OTHER): Payer: Self-pay

## 2020-04-17 DIAGNOSIS — E892 Postprocedural hypoparathyroidism: Secondary | ICD-10-CM

## 2020-04-17 DIAGNOSIS — E049 Nontoxic goiter, unspecified: Secondary | ICD-10-CM

## 2020-04-17 LAB — MAGNESIUM: Magnesium: 1.8 mg/dL (ref 1.5–2.5)

## 2020-04-17 LAB — CALCIUM: Calcium: 8.2 mg/dL — ABNORMAL LOW (ref 8.6–10.2)

## 2020-04-17 LAB — PHOSPHORUS: Phosphorus: 5.8 mg/dL — ABNORMAL HIGH (ref 2.7–5.0)

## 2020-04-18 ENCOUNTER — Encounter (INDEPENDENT_AMBULATORY_CARE_PROVIDER_SITE_OTHER): Payer: Self-pay

## 2020-04-19 ENCOUNTER — Other Ambulatory Visit: Payer: Self-pay

## 2020-04-19 ENCOUNTER — Observation Stay (HOSPITAL_COMMUNITY)
Admission: AD | Admit: 2020-04-19 | Discharge: 2020-04-21 | Disposition: A | Discharge status: Home / Self Care | Payer: Medicaid Other | Source: Other Acute Inpatient Hospital | Attending: Internal Medicine | Admitting: Internal Medicine

## 2020-04-19 ENCOUNTER — Encounter (HOSPITAL_COMMUNITY): Payer: Self-pay | Admitting: Pediatrics

## 2020-04-19 DIAGNOSIS — E892 Postprocedural hypoparathyroidism: Secondary | ICD-10-CM | POA: Diagnosis present

## 2020-04-19 DIAGNOSIS — E039 Hypothyroidism, unspecified: Secondary | ICD-10-CM | POA: Diagnosis not present

## 2020-04-19 DIAGNOSIS — Z6831 Body mass index (BMI) 31.0-31.9, adult: Secondary | ICD-10-CM

## 2020-04-19 DIAGNOSIS — F419 Anxiety disorder, unspecified: Secondary | ICD-10-CM | POA: Diagnosis present

## 2020-04-19 DIAGNOSIS — Z9641 Presence of insulin pump (external) (internal): Secondary | ICD-10-CM | POA: Diagnosis present

## 2020-04-19 DIAGNOSIS — E669 Obesity, unspecified: Secondary | ICD-10-CM | POA: Diagnosis present

## 2020-04-19 DIAGNOSIS — Z91040 Latex allergy status: Secondary | ICD-10-CM | POA: Diagnosis not present

## 2020-04-19 DIAGNOSIS — Z880 Allergy status to penicillin: Secondary | ICD-10-CM

## 2020-04-19 DIAGNOSIS — E109 Type 1 diabetes mellitus without complications: Secondary | ICD-10-CM | POA: Diagnosis not present

## 2020-04-19 DIAGNOSIS — Z87891 Personal history of nicotine dependence: Secondary | ICD-10-CM

## 2020-04-19 DIAGNOSIS — Z800 Family history of malignant neoplasm of digestive organs: Secondary | ICD-10-CM

## 2020-04-19 DIAGNOSIS — Z79899 Other long term (current) drug therapy: Secondary | ICD-10-CM | POA: Insufficient documentation

## 2020-04-19 DIAGNOSIS — Z20822 Contact with and (suspected) exposure to covid-19: Secondary | ICD-10-CM | POA: Insufficient documentation

## 2020-04-19 DIAGNOSIS — R000 Tachycardia, unspecified: Secondary | ICD-10-CM | POA: Diagnosis present

## 2020-04-19 DIAGNOSIS — F32A Depression, unspecified: Secondary | ICD-10-CM | POA: Diagnosis present

## 2020-04-19 DIAGNOSIS — Z801 Family history of malignant neoplasm of trachea, bronchus and lung: Secondary | ICD-10-CM

## 2020-04-19 DIAGNOSIS — I2510 Atherosclerotic heart disease of native coronary artery without angina pectoris: Secondary | ICD-10-CM | POA: Diagnosis present

## 2020-04-19 DIAGNOSIS — E890 Postprocedural hypothyroidism: Secondary | ICD-10-CM | POA: Diagnosis present

## 2020-04-19 DIAGNOSIS — Z833 Family history of diabetes mellitus: Secondary | ICD-10-CM

## 2020-04-19 DIAGNOSIS — Z8249 Family history of ischemic heart disease and other diseases of the circulatory system: Secondary | ICD-10-CM

## 2020-04-19 DIAGNOSIS — Z825 Family history of asthma and other chronic lower respiratory diseases: Secondary | ICD-10-CM

## 2020-04-19 DIAGNOSIS — Z794 Long term (current) use of insulin: Secondary | ICD-10-CM

## 2020-04-19 DIAGNOSIS — E876 Hypokalemia: Secondary | ICD-10-CM | POA: Diagnosis present

## 2020-04-19 DIAGNOSIS — Z9114 Patient's other noncompliance with medication regimen: Secondary | ICD-10-CM

## 2020-04-19 DIAGNOSIS — R413 Other amnesia: Secondary | ICD-10-CM | POA: Diagnosis present

## 2020-04-19 DIAGNOSIS — Z888 Allergy status to other drugs, medicaments and biological substances status: Secondary | ICD-10-CM

## 2020-04-19 DIAGNOSIS — I10 Essential (primary) hypertension: Secondary | ICD-10-CM | POA: Diagnosis present

## 2020-04-19 HISTORY — DX: Hypoparathyroidism, unspecified: E20.9

## 2020-04-19 LAB — GLUCOSE, CAPILLARY: Glucose-Capillary: 167 mg/dL — ABNORMAL HIGH (ref 70–99)

## 2020-04-19 MED ORDER — GLUCAGON 3 MG/DOSE NA POWD: 3.0000 mg | Freq: Once | NASAL | Status: DC | PRN

## 2020-04-19 MED ORDER — DULOXETINE HCL 20 MG PO CPEP
20.0000 mg | ORAL_CAPSULE | Freq: Every day | ORAL | Status: DC
Start: 2020-04-20 — End: 2020-04-21
  Filled 2020-04-19 (×3): qty 1

## 2020-04-19 MED ORDER — LIDOCAINE-SODIUM BICARBONATE 1-8.4 % IJ SOSY: 0.2500 mL | PREFILLED_SYRINGE | INTRAMUSCULAR | Status: DC | PRN

## 2020-04-19 MED ORDER — AMITRIPTYLINE HCL 25 MG PO TABS
75.0000 mg | ORAL_TABLET | Freq: Every day | ORAL | Status: DC
  Administered 2020-04-19 – 2020-04-20 (×2): 75 mg via ORAL
  Filled 2020-04-19: qty 3
  Filled 2020-04-19 (×2): qty 1

## 2020-04-19 MED ORDER — LORATADINE 10 MG PO TABS
10.0000 mg | ORAL_TABLET | Freq: Every day | ORAL | Status: DC
  Administered 2020-04-20: 10 mg via ORAL
  Filled 2020-04-19: qty 1

## 2020-04-19 MED ORDER — PANTOPRAZOLE SODIUM 20 MG PO TBEC
40.0000 mg | DELAYED_RELEASE_TABLET | Freq: Every day | ORAL | Status: DC
  Filled 2020-04-19: qty 2

## 2020-04-19 MED ORDER — LEVOTHYROXINE SODIUM 150 MCG PO TABS
150.0000 ug | ORAL_TABLET | Freq: Every day | ORAL | Status: DC
  Filled 2020-04-19: qty 1

## 2020-04-19 MED ORDER — METOPROLOL TARTRATE 25 MG PO TABS
25.0000 mg | ORAL_TABLET | Freq: Two times a day (BID) | ORAL | Status: DC
  Administered 2020-04-19 – 2020-04-20 (×3): 25 mg via ORAL
  Filled 2020-04-19 (×6): qty 1

## 2020-04-19 MED ORDER — INSULIN PUMP
SUBCUTANEOUS | Status: DC
  Filled 2020-04-19: qty 1

## 2020-04-19 MED ORDER — BUPROPION HCL ER (XL) 300 MG PO TB24
300.0000 mg | ORAL_TABLET | Freq: Every day | ORAL | Status: DC
  Administered 2020-04-19 – 2020-04-20 (×2): 300 mg via ORAL
  Filled 2020-04-19 (×3): qty 1

## 2020-04-19 MED ORDER — CETIRIZINE HCL 10 MG PO TABS
10.0000 mg | ORAL_TABLET | Freq: Every day | ORAL | Status: DC
  Filled 2020-04-19 (×3): qty 1

## 2020-04-19 MED ORDER — HYDROCHLOROTHIAZIDE 50 MG PO TABS
50.0000 mg | ORAL_TABLET | Freq: Two times a day (BID) | ORAL | Status: DC
  Administered 2020-04-19 – 2020-04-20 (×3): 50 mg via ORAL
  Filled 2020-04-19 (×6): qty 1

## 2020-04-19 MED ORDER — LIDOCAINE 4 % EX CREA: 1.0000 "application " | TOPICAL_CREAM | CUTANEOUS | Status: DC | PRN

## 2020-04-19 MED ORDER — GLUCAGON HCL RDNA (DIAGNOSTIC) 1 MG IJ SOLR: 1.0000 mg | Freq: Once | INTRAMUSCULAR | Status: DC

## 2020-04-19 MED ORDER — PENTAFLUOROPROP-TETRAFLUOROETH EX AERO: INHALATION_SPRAY | CUTANEOUS | Status: DC | PRN

## 2020-04-19 MED ORDER — FLUTICASONE PROPIONATE 50 MCG/ACT NA SUSP
1.0000 | Freq: Two times a day (BID) | NASAL | Status: DC | PRN
  Filled 2020-04-19: qty 16

## 2020-04-19 MED ORDER — LEVOCETIRIZINE DIHYDROCHLORIDE 5 MG PO TABS: 5.0000 mg | ORAL_TABLET | Freq: Every evening | ORAL | Status: DC

## 2020-04-19 NOTE — H&P (Addendum)
Pediatric Teaching Program H&P [address]  [place], [place] [address] Phone: [phone] Fax: [phone]   Patient Details  Name: Latajah Thuman MRN: [id number] DOB: [birth year] Age: 20 y.o.          Gender: female  Chief Complaint  hypocalcemia  History of the Present Illness  Tniya Adrieana Fennelly is a 20 y.o. female with a PMH of Hashimoto's, IBD, T1DM, hypoglycemia, hypothyroidism, inapporptiate tachycardia and hypoparathyroidism who presents with symptomatic hypocalcemia. Ajanay had a total thyroidectomy for enlarged thyroid gland in the context of Hashimoto's thyroiditis in December of 2020. Since then, she has required frequent changes to her calcium supplementation due to persistent hypocalcemia. Prior to admission, she was taking nine 500mg  calcium-vitamin D pills a day. Her most recent lab evaluation on 03/16 revealed a calcium of 8.2 (decreased from 8.6 two days prior). She developed symptoms including body spasms and cramping, eye twitching, loss of appetite and diarrhea for approximately 1 month. She also endorses orthostasis for 2 weeks. She denies abdominal pain, fever, chest pain or shortness of breath. She has not had any changes in meds or diet during this time. She has been having wide ranged glucoses from 50-400 with most being ~200, but she does not think this triggered her current symptoms. Pt was admitted directly to the floor for treatment.   Review of Systems  All others negative except as stated in HPI (understanding for more complex patients, 10 systems should be reviewed)  Past Birth, Medical & Surgical History  Frequent hospitalizations for DKA, hypcalcemia   Developmental History  None   Diet History  None   Family History  MGM lung cancer MGF: bone cancer, hypothyroid   Social History  Mom. 2 dogs 5 cats, 3 hamsters  Not in school   Primary Care Provider  Dr. 4/16 at [facility]  [facility] Dr. Durwin Nora Endocrinologist  Home Medications  Medication     Dose Calcium vit  500 mg 9 pills a day  calcitriol .Vanessa Fisher daily   wellbutrin  300 mg daily   amitryptyline 75 mg daily  Cymbalta 20 mg daily  flonase 14mcg/act  hydochlorthiazide 50 mg BID  Humalog  Xyzal 5 mg daily   Synthroid 150 mcg daily  claratin 10 mg daily  Lopressor 25mg  BID   Prontonix 40 mg  prn  Potassium gluconate 2.5 meq tabs prn  Allergies   Allergies  Allergen Reactions  . Amoxicillin-Pot Clavulanate Diarrhea and Nausea And Vomiting    Did it involve swelling of the face/tongue/throat, SOB, or low BP? No Did it involve sudden or severe rash/hives, skin peeling, or any reaction on the inside of your mouth or nose? No Did you need to seek medical attention at a hospital or doctor's office? No When did it last happen?Few years ago If all above answers are "NO", may proceed with cephalosporin use.  . Fish Allergy Diarrhea  . Lactose Intolerance (Gi) Diarrhea  . Shellfish-Derived Products Diarrhea and Nausea And Vomiting  . Tape Itching    Medical tape causes itching  . Lactase Nausea And Vomiting  . Latex Rash    Immunizations  UTD+ flu  Exam  There were no vitals taken for this visit.  Weight:     Facility age limit for growth percentiles is 20 years.  General: Well appearing, well hydrated young female in no acute distress  HEENT: Head atraumatic. MMM. Sclera anicteric  CV: Nl S1 and S2. RRR, No murmurs, rubs or gallops.  2+ pedal pulses  Pulm: Clear to ascultation bilaterally. Nl work of breathing Abd: Soft, non tender, non distended. No masses  Skin: Skin warm and well perfused  MSK: Nl tone. Nl strength exam   Selected Labs & Studies  CMP pending  Ionized calcium pending  Mg pending  Phosphorous pending  Assessment  Active Problems:   * No active hospital problems. *  Narcisa Georgi Tuel is a 20 y.o. female with complex pmh including Hashimoto's and  hypoparathyroidism presenting for muscle spasms, cramps, eye twitching, and diarrhea concerning for hypocalcemia. Pt has been hospitalized several times in the past for this condition despite PO calcium supplementation. Will get labs to evaluate calcium level and consider giving IV calcium if calcium level is low. Will continue to communicate with Pediatric Endocrinology for further management.   Plan   Hypocalcemia:  - Continue home meds  - Peds Endocrinology consulted, appreciate recs - EKG, CRM - CMP/Ca/Mag/Phos              - Consider IV calcium replacement  Type 1 Diabetes:  - Dexacom. Pt managing her own insulin  - POCT CBG Q6 - hypoglycemic protocol   FENGI: - Regular Diet  - Protonix 40 mg prn    Access: - None   Interpreter present: no  Jerral Bonito, Medical Student 04/19/2020, 8:18 PM

## 2020-04-19 NOTE — H&P (Shared)
Pediatric Teaching Program H&P [address]  [place], [place] [address] Phone: [phone] Fax: [phone]   Patient Details  Name: Cassandra [name] MRN: [id number] DOB: 10-25-2000 Age: 20 y.o.          Gender: female  Chief Complaint  Hypocalcemia   History of the Present Illness  Cassandra [name] is a 20 y.o. female, with a PMH of anxiety, IBD, T1DM, hypoglycemia, hypothyroidism, and hypoparathyroidism, who presents with symptomatic hypocalcemia.   Cassandra [name] had a thyroidectomy completed in December of 2020. Since then, she has required frequent changes to her calcium supplementation due to persistent hypocalcemia. Prior to admission, she was taking approximately 4.5 grams of calcium supplementation per day. Her most recent lab evaluation on 03/16 revealed a calcium of 8.2 (decreased from 8.6 two days prior). She developed symptoms including *** approximately *** days ago.    Review of Systems  {CHL IP PEDS ROS:21316::"General: ***","Neuro: ***","HEENT: ***","CV: ***","Respiratory: ***","GU: ***","Endo: ***","MSK: ***","Skin: ***","Psych/behavior: ***","Other: ***"}  Past Birth, Medical & Surgical History   Past Medical History:  Diagnosis Date  . Anxiety   . Complication of anesthesia    woke up during Colonoscopy and EGD  . Depression   . Diabetes mellitus without complication (HCC)   . Dysrhythmia    Tachy  . Family history of adverse reaction to anesthesia    Mom woke up during surgery.  [name] GERD (gastroesophageal reflux disease)   . Hypertension   . Neuropathy    Feet, legs, hands   Past Surgical History:  Procedure Laterality Date  . ADENOIDECTOMY    . COLONOSCOPY    . ESOPHAGOGASTRODUODENOSCOPY    . THYROIDECTOMY N/A 01/25/2019   Procedure: COMPLETE THYROIDECTOMY;  Surgeon: Newman Pies, MD;  Location: [facility];  Service: ENT;  Laterality: N/A;  . TOE SURGERY Bilateral    for ingrown toenails  . TYMPANOSTOMY TUBE PLACEMENT     . WISDOM TOOTH EXTRACTION     Developmental History  ***  Diet History  ***  Family History  ***  Social History  ***  Primary Care Provider  ***  Home Medications  Medication     Dose           Allergies   Allergies  Allergen Reactions  . Amoxicillin-Pot Clavulanate Diarrhea and Nausea And Vomiting    Did it involve swelling of the face/tongue/throat, SOB, or low BP? No Did it involve sudden or severe rash/hives, skin peeling, or any reaction on the inside of your mouth or nose? No Did you need to seek medical attention at a hospital or doctor's office? No When did it last happen?Few years ago If all above answers are "NO", may proceed with cephalosporin use.  . Fish Allergy Diarrhea  . Lactose Intolerance (Gi) Diarrhea  . Shellfish-Derived Products Diarrhea and Nausea And Vomiting  . Tape Itching    Medical tape causes itching  . Lactase Nausea And Vomiting  . Latex Rash    Immunizations  ***  Exam  There were no vitals taken for this visit.  Weight:     Facility age limit for growth percentiles is 20 years.  General: *** HEENT: *** Neck: *** Lymph nodes: *** Chest: *** Heart: *** Abdomen: *** Genitalia: *** Extremities: *** Musculoskeletal: *** Neurological: *** Skin: ***  Selected Labs & Studies  ***  Assessment  Active Problems:   * No active hospital problems. *   Cassandra [name] is a 20 y.o. female admitted for ***   Plan  Hypocalcemia:  - Peds Endocrinology consulted, appreciate recs - EKG, CRM - CMP/Ca/Mag/Phos   - Consider IV calcium replacement  FENGI:***  Access:***   {Interpreter present:21282}  Christophe Louis, MD 04/19/2020, 1:33 PM

## 2020-04-19 NOTE — H&P (Incomplete Revision)
Pediatric Teaching Program H&P [address]  [place], [place] [address] Phone: [phone] Fax: [phone]   Patient Details  Name: Cassandra [name] MRN: [id number] DOB: [birth year] Age: 20 y.o.          Gender: female  Chief Complaint  hypocalcemia  History of the Present Illness  Cassandra [name] is a 20 y.o. female with a PMH of Hashimoto's, IBD, T1DM, hypoglycemia, hypothyroidism, inapporptiate tachycardia and hypoparathyroidism who presents with symptomatic hypocalcemia. Cassandra [name] had a total thyroidectomy for enlarged thyroid gland in the context of Hashimoto's thyroiditis in December of 2020. Since then, she has required frequent changes to her calcium supplementation due to persistent hypocalcemia. Prior to admission, she was taking nine 500mg  calcium-vitamin D pills a day. Her most recent lab evaluation on 03/16 revealed a calcium of 8.2 (decreased from 8.6 two days prior). She developed symptoms including body spasms and cramping, eye twitching, loss of appetite and diarrhea for approximately 1 month. She also endorses orthostasis for 2 weeks. She denies abdominal pain, fever, chest pain or shortness of breath. She has not had any changes in meds or diet during this time. She has been having wide ranged glucoses from 50-400 with most being ~200, but she does not think this triggered her current symptoms. Pt was admitted directly to the floor for treatment.   Review of Systems  All others negative except as stated in HPI (understanding for more complex patients, 10 systems should be reviewed)  Past Birth, Medical & Surgical History  Frequent hospitalizations for DKA, hypcalcemia   Developmental History  None   Diet History  None   Family History  MGM lung cancer MGF: bone cancer, hypothyroid   Social History  Mom. 2 dogs 5 cats, 3 hamsters  Not in school   Primary Care Provider  Dr. 4/16 at [facility]  [facility] Dr. Durwin Nora Endocrinologist  Home Medications  Medication     Dose Calcium vit  500 mg 9 pills a day  calcitriol .Vanessa Fisher daily   wellbutrin  300 mg daily   amitryptyline 75 mg daily  Cymbalta 20 mg daily  flonase 14mcg/act  hydochlorthiazide 50 mg BID  Humalog  Xyzal 5 mg daily   Synthroid 150 mcg daily  claratin 10 mg daily  Lopressor 25mg  BID   Prontonix 40 mg  prn  Potassium gluconate 2.5 meq tabs prn  Allergies   Allergies  Allergen Reactions  . Amoxicillin-Pot Clavulanate Diarrhea and Nausea And Vomiting    Did it involve swelling of the face/tongue/throat, SOB, or low BP? No Did it involve sudden or severe rash/hives, skin peeling, or any reaction on the inside of your mouth or nose? No Did you need to seek medical attention at a hospital or doctor's office? No When did it last happen?Few years ago If all above answers are "NO", may proceed with cephalosporin use.  . Fish Allergy Diarrhea  . Lactose Intolerance (Gi) Diarrhea  . Shellfish-Derived Products Diarrhea and Nausea And Vomiting  . Tape Itching    Medical tape causes itching  . Lactase Nausea And Vomiting  . Latex Rash    Immunizations  UTD+ flu  Exam  There were no vitals taken for this visit.  Weight:     Facility age limit for growth percentiles is 20 years.  General: Well appearing, well hydrated young female in no acute distress  HEENT: Head atraumatic. MMM. Sclera anicteric  CV: Nl S1 and S2. RRR, No murmurs, rubs or gallops.  2+ pedal pulses  Pulm: Clear to ascultation bilaterally. Nl work of breathing Abd: Soft, non tender, non distended. No masses  Skin: Skin warm and well perfused  MSK: Nl tone. Nl strength exam   Selected Labs & Studies  CMP pending  Ionized calcium pending  Mg pending  Phosphorous pending  Assessment  Active Problems:   * No active hospital problems. *  Cassandra [name] is a 21 y.o. female with complex pmh including Hashimoto's and  hypoparathyroidism presenting for muscle spasms, cramps, eye twitching, and diarrhea concerning for hypocalcemia. Pt has been hospitalized several times in the past for this condition despite PO calcium supplementation. Will get labs to evaluate calcium level and consider giving IV calcium if calcium level is low. Will continue to communicate with Pediatric Endocrinology for further management.   Plan   Hypocalcemia:  - Continue home meds  - Peds Endocrinology consulted, appreciate recs - EKG, CRM - CMP/Ca/Mag/Phos              - Consider IV calcium replacement  Type 1 Diabetes:  - Dexacom. Pt managing her own insulin  - POCT CBG Q6 - hypoglycemic protocol   FENGI: - Regular Diet  - Protonix 40 mg prn    Access: - None   Chronic Meds:  Interpreter present: no  Jerral Bonito, Medical Student 04/19/2020, 8:18 PM

## 2020-04-20 ENCOUNTER — Encounter (INDEPENDENT_AMBULATORY_CARE_PROVIDER_SITE_OTHER): Payer: Self-pay

## 2020-04-20 DIAGNOSIS — E892 Postprocedural hypoparathyroidism: Secondary | ICD-10-CM | POA: Diagnosis not present

## 2020-04-20 DIAGNOSIS — F32A Depression, unspecified: Secondary | ICD-10-CM

## 2020-04-20 DIAGNOSIS — F419 Anxiety disorder, unspecified: Secondary | ICD-10-CM

## 2020-04-20 DIAGNOSIS — E10649 Type 1 diabetes mellitus with hypoglycemia without coma: Secondary | ICD-10-CM | POA: Diagnosis not present

## 2020-04-20 DIAGNOSIS — E890 Postprocedural hypothyroidism: Secondary | ICD-10-CM

## 2020-04-20 DIAGNOSIS — E1065 Type 1 diabetes mellitus with hyperglycemia: Secondary | ICD-10-CM | POA: Diagnosis not present

## 2020-04-20 LAB — COMPREHENSIVE METABOLIC PANEL
ALT: 17 U/L (ref 0–44)
AST: 20 U/L (ref 15–41)
Albumin: 4.1 g/dL (ref 3.5–5.0)
Alkaline Phosphatase: 68 U/L (ref 38–126)
Anion gap: 8 (ref 5–15)
BUN: 9 mg/dL (ref 6–20)
CO2: 24 mmol/L (ref 22–32)
Calcium: 8.2 mg/dL — ABNORMAL LOW (ref 8.9–10.3)
Chloride: 104 mmol/L (ref 98–111)
Creatinine, Ser: 0.77 mg/dL (ref 0.44–1.00)
GFR, Estimated: >60 mL/min (ref >60)
Glucose, Bld: 144 mg/dL — ABNORMAL HIGH (ref 70–99)
Potassium: 3.3 mmol/L — ABNORMAL LOW (ref 3.5–5.1)
Sodium: 136 mmol/L (ref 135–145)
Total Bilirubin: 0.7 mg/dL (ref 0.3–1.2)
Total Protein: 7.3 g/dL (ref 6.5–8.1)

## 2020-04-20 LAB — BASIC METABOLIC PANEL
Anion gap: 11 (ref 5–15)
Anion gap: 7 (ref 5–15)
BUN: 8 mg/dL (ref 6–20)
BUN: 13 mg/dL (ref 6–20)
CO2: 24 mmol/L (ref 22–32)
CO2: 26 mmol/L (ref 22–32)
Calcium: 8.7 mg/dL — ABNORMAL LOW (ref 8.9–10.3)
Calcium: 8.6 mg/dL — ABNORMAL LOW (ref 8.9–10.3)
Chloride: 102 mmol/L (ref 98–111)
Chloride: 100 mmol/L (ref 98–111)
Creatinine, Ser: 0.79 mg/dL (ref 0.44–1.00)
Creatinine, Ser: 0.88 mg/dL (ref 0.44–1.00)
GFR, Estimated: >60 mL/min (ref >60)
GFR, Estimated: >60 mL/min (ref >60)
Glucose, Bld: 136 mg/dL — ABNORMAL HIGH (ref 70–99)
Glucose, Bld: 226 mg/dL — ABNORMAL HIGH (ref 70–99)
Potassium: 3.7 mmol/L (ref 3.5–5.1)
Potassium: 3.6 mmol/L (ref 3.5–5.1)
Sodium: 137 mmol/L (ref 135–145)
Sodium: 133 mmol/L — ABNORMAL LOW (ref 135–145)

## 2020-04-20 LAB — POCT I-STAT EG7
Acid-Base Excess: 0 mmol/L (ref 0.0–2.0)
Bicarbonate: 24.8 mmol/L (ref 20.0–28.0)
Calcium, Ion: 1.06 mmol/L — ABNORMAL LOW (ref 1.15–1.40)
HCT: 38 % (ref 36.0–46.0)
Hemoglobin: 12.9 g/dL (ref 12.0–15.0)
O2 Saturation: 92 %
Potassium: 3.3 mmol/L — ABNORMAL LOW (ref 3.5–5.1)
Sodium: 143 mmol/L (ref 135–145)
TCO2: 26 mmol/L (ref 22–32)
pCO2, Ven: 39.9 mmHg — ABNORMAL LOW (ref 44.0–60.0)
pH, Ven: 7.401 (ref 7.250–7.430)
pO2, Ven: 63 mmHg — ABNORMAL HIGH (ref 32.0–45.0)

## 2020-04-20 LAB — MAGNESIUM: Magnesium: 1.8 mg/dL (ref 1.7–2.4)

## 2020-04-20 LAB — GLUCOSE, CAPILLARY
Glucose-Capillary: 154 mg/dL — ABNORMAL HIGH (ref 70–99)
Glucose-Capillary: 168 mg/dL — ABNORMAL HIGH (ref 70–99)
Glucose-Capillary: 177 mg/dL — ABNORMAL HIGH (ref 70–99)
Glucose-Capillary: 177 mg/dL — ABNORMAL HIGH (ref 70–99)
Glucose-Capillary: 285 mg/dL — ABNORMAL HIGH (ref 70–99)
Glucose-Capillary: 346 mg/dL — ABNORMAL HIGH (ref 70–99)

## 2020-04-20 LAB — TSH: TSH: 5.634 u[IU]/mL — ABNORMAL HIGH (ref 0.350–4.500)

## 2020-04-20 LAB — T4, FREE: Free T4: 0.93 ng/dL (ref 0.61–1.12)

## 2020-04-20 LAB — HIV ANTIBODY (ROUTINE TESTING W REFLEX): HIV Screen 4th Generation wRfx: NONREACTIVE

## 2020-04-20 LAB — PHOSPHORUS: Phosphorus: 4.6 mg/dL (ref 2.5–4.6)

## 2020-04-20 LAB — SARS CORONAVIRUS 2 (TAT 6-24 HRS): SARS Coronavirus 2: NEGATIVE

## 2020-04-20 MED ORDER — CALCIUM GLUCONATE-NACL 2-0.675 GM/100ML-% IV SOLN
2.0000 g | Freq: Once | INTRAVENOUS | Status: DC
  Filled 2020-04-20: qty 100

## 2020-04-20 MED ORDER — CALCIUM CARBONATE-VITAMIN D 500-200 MG-UNIT PO TABS
4.0000 | ORAL_TABLET | Freq: Three times a day (TID) | ORAL | Status: DC
  Administered 2020-04-20 (×4): 4 via ORAL
  Filled 2020-04-20 (×9): qty 4

## 2020-04-20 MED ORDER — CALCITRIOL 0.5 MCG PO CAPS
1.0000 ug | ORAL_CAPSULE | Freq: Every evening | ORAL | Status: DC
  Administered 2020-04-20: 1 ug via ORAL
  Filled 2020-04-20 (×2): qty 2

## 2020-04-20 MED ORDER — VITAMIN D3 25 MCG PO TABS
1000.0000 [IU] | ORAL_TABLET | Freq: Every day | ORAL | Status: DC
  Administered 2020-04-20: 1000 [IU] via ORAL
  Filled 2020-04-20 (×3): qty 1

## 2020-04-20 MED ORDER — CALCITRIOL 0.5 MCG PO CAPS
1.2500 ug | ORAL_CAPSULE | Freq: Every day | ORAL | Status: DC
  Administered 2020-04-20: 1.25 ug via ORAL
  Filled 2020-04-20 (×3): qty 1

## 2020-04-20 MED ORDER — LEVOTHYROXINE SODIUM 150 MCG PO TABS
150.0000 ug | ORAL_TABLET | Freq: Every day | ORAL | Status: DC
  Administered 2020-04-20 – 2020-04-21 (×2): 150 ug via ORAL
  Filled 2020-04-20 (×3): qty 1

## 2020-04-20 NOTE — Progress Notes (Addendum)
[facility] Progress Note  Patient name: Cassandra [name] Medical record number: [id number] Date of birth: 02/01/2001 Age: 20 y.o. Gender: female    LOS: 1 day   Primary Care Provider: Ardelle Balls, MD  Overnight Events: NAEON. EKG was performed and showed no prolonged QT. Patient states that she feels fine this morning and is not having any muscle spasms or facial twitching.   Objective: Vital signs in last 24 hours: Temp:  [97.88 F (36.6 C)-98.6 F (37 C)] 97.88 F (36.6 C) (03/19 1110) Pulse Rate:  [69-118] 89 (03/19 1110) Resp:  [16-20] 16 (03/19 1110) BP: (104-143)/(73-93) 104/76 (03/19 0722) SpO2:  [95 %-100 %] 100 % (03/19 1110) Weight:  [72.7 kg] 72.7 kg (03/18 2039)  Wt Readings from Last 3 Encounters:  04/19/20 72.7 kg  02/15/20 72.8 kg (87 %, Z= 1.15)*  12/18/19 70.6 kg (85 %, Z= 1.02)*   * Growth percentiles are based on CDC (Girls, 2-20 Years) data.      Intake/Output Summary (Last 24 hours) at 04/20/2020 1528 Last data filed at 04/20/2020 1344 Gross per 24 hour  Intake 591 ml  Output 1400 ml  Net -809 ml    PE: GEN: Well-appearing female, awake, alert HEENT: Conjunctiva clear, no facial spasms noted CV: Regular rate and rhythm; no murmurs, rubs, or gallops RESP: Clear to auscultation bilaterally ABD: Soft, non-distended SKIN: Warm and dry, cap refill < 2 seconds NEURO: Normal mood, answers questions appropriately  Labs/Studies: TSH: 5.634 Free T4: 0.93 Ca2+: 8.7 Glucose: 346  Assessment/Plan: Breiona is a 20 y.o. female with a hx of hypoparathyroidism who was admitted for symptomatic hypocalcemia. Patient is currently asymptomatic and states that she's doing well. Calcium increased from 8.2 to 8.7 this morning after being on her prescribed home dose of calcium supplementation. Her home levothyroxine was initially held while thyroid studies were collected and pending to determine if she has hypothyroidism currently.  Her TSH was elevated at 5.634, so her home levothyroxine was started. Patient was noted by Pediatric endocrinology to have some memory problems, which causes issues in her remembering to take her medications properly and as scheduled. Will continue patient on her home medications at the prescribed dose and frequency, trend calcium levels, and monitor for symptoms of hypocalcemia. Pediatric endocrinology is following.  Post surgical hypocalcemia/hypoparathyroidismPost surgical hypocalcemia/hypoparathyroidism - Continue home calcium supplements, calcitriol, and HCTZ - Continue calcium-vitamin D3 supplementation - Start Vitamin D3 supplementation  - BMP on 3/20 in AM - Peds Endocrinology following, appreciate recs  Hypothyroidism  - Start home levothyroxine  Type 1 Diabetes:  - Dexacom  -  Patient managing her own insulin with home insulin pump - POCT CBG Q6H - Hypoglycemic protocol   Inappropriate tachycardia:  - Continue home metoprolol tartrate  Anxiety: - Continue home amitriptyline, Cymbalta, and Wellbutrin  Environmental allergies: - Continue home Xyzal, Claritin, and Flonase  FENGI: - Regular diet    Adria Devon 04/20/2020 3:28 PM   I saw and evaluated the patient, performing the key elements of the service. I developed the management plan that is described in the resident's note, and I agree with the content.    Henrietta Hoover, MD                  04/20/2020, 9:57 PM

## 2020-04-20 NOTE — Consult Note (Addendum)
Name: Cassandra [name], Dowding MRN: [id number] DOB: July 24, 2000 Age: 20 y.o.   Chief Complaint/ Reason for Consult: achieve control of hypocalcemia and post-operative hypoparathyroidism in the setting of coexisting T1DM, post-surgical hypothyroidism, and history of anxiety and depression and both noncompliance with diabetes treatment and intentional overdosing of insulin.   Attending: Soufleris, Lelon Frohlich, MD  Problem List:  Patient Active Problem List   Diagnosis Date Noted  . 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 Hypocalcemia 02/07/2019  . S/P thyroidectomy 01/27/2019  . 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/25/2019  . Hypomagnesemia   . Hypokalemia   . History of depression   . History of hypothyroidism   . MRSA cellulitis   . History of gastroesophageal reflux (GERD)   . 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 Migraine syndrome 03/17/2017  . Symptomatic mammary hypertrophy 03/17/2017  . Sleeping difficulty 02/17/2017  . Moderate headache 12/14/2016  . Anxiety state 12/14/2016  . Neuropathy 12/10/2016  . Type 1 diabetes, uncontrolled, with neuropathy ([place]) 12/10/2016  . Hypertension 11/15/2016  . Hypoglycemia 11/14/2016  . 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 Dyslipidemia 04/08/2014  . 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 Caucasian young woman who was interviewed and examined in her room.   A. Mckinzey was admitted to the Children's Unit yesterday evening by Dr. Baldo Ash for evaluation and management of persistent hypocalcemia and poorly controlled T1DM, in the setting of post-surgical hypoparathyroidism and post operative hypothyroidism.    1. Cassandra [name] was admitted to [facility] on 04/19/2011 at age 74 with new-onset T1DM. She was followed there for her T1DM until 11/13/2016, when she was transferred from [facility]  to the [place] [facility] at [facility] for evaluation and treatment of hypoglycemia. The transfer had been requested by Cassandra [name]'s mother, who had become disenchanted with Cassandra [name]'s pediatric endocrinologist, Dr. Barry Dienes, MD. Dr. Tobe Sos was the consulting endocrinologist at [facility] and accepted Cassandra [name] in transfer. Dr. Dirk Dress gave Dr. Tobe Sos a history that included multiple intentional overdoses of insulin injections while on an MDI regimen and overdoses of insulin boluses while on pump therapy. Dr. Dirk Dress had discontinued insulin pump therapy for Cassandra [name] and re-started her on an MDI plan. Cassandra [name] was re-admitted to [facility] on 11/11/2016 for another episode of hypoglycemia. When she had another severe episode of hypoglycemia while in the hospital, the nurses inspected Temperance's room and found a Lantus pen inside her make up kit that she had taken into the bathroom with her. Although her insulins had been intentionally discontinued after breakfast on 11/12/16, she had insulin levels of 40 on 11/12/16 and 60 on 11/13/16, with a negative C-peptide. When Dr. Dirk Dress suggested a psychiatry consultation, the mother demanded a transfer. Upon admission to [facility], the mother stated that [place] had not been taking any insulin for several days before the admission on 11/11/2016. Mother also stated she was certain that [place] had not taken any excess insulin during that admission because she was watching Emmalise "like a hawk".     2). The following Past Medical History was obtained at that 11/13/2016 admission.     a). Medical: Hypertension, some eye problem, goiter and possibly hypothyroidism, peptic ulcer disease requiring admission for EGD and treatment about one year ago, GERD,  chronic back problems, migraines  b). Surgical: PE tubes x 2, adenoidectomy                           c) Allergies: Augmentin, pollen allergies                          d). Medications: Lantus,Humalog, lisinopril, Prevacid, a  muscle relaxer PRN, Xyzal PRN, Elavil PRN, and Imitrex, PRN                          e). Mental health: Counseling for depression in 2013                          f). GYN: She had menarche at age 79. Periods occurred every 1-3 moths and were very heavy and painful, so she began treatment with Nexplanon implants.    3). The following family history was obtained:     a). DM: Paternal grandmother and maternal great grandmother had Type 2 DM.                          b). Thyroid disease: Maternal grandfather developed hypothyroidism without having had thyroid surgery or irradiation or having gone on a prolonged low iodine diet.                           c). ASCVD: Maternal great grandfather had strokes.                           d). Cancers: Maternal grandfather had bone cancer of his hip. Maternal great grandfather had lung Ca. Mother , maternal uncle, and maternal grandparents have all had malignant colon polyps. Malanie was scheduled to have a colonoscopy and EGD next week at [facility].                           e). Other family diseases: Mother maternal uncle, and maternal grandmother all have gastric hyperacidity, peptic ulcer disease, and reflux. Mom has asthma and is being evaluated for glaucoma. Maternal grandmother and maternal great grandmother had hypertension. One of Yazhini's second cousins has kidney disease and leaks protein into the urine.    4. During that admission we re-started her Lantus dose and converted her to a Humalog 150/50/15 plan. She did not have any further hypoglycemia during that admission. She was euthyroid at that time. She was discharged on 11/16/2016.    5. Since 01/07/2017 Cassandra [name] has been followed by DrMarland Kitchen Ludwig Lean, MD, in our [facility]. In the interim:    A). Dr. Baldo Ash reinstituted insulin pump therapy for Cassandra [name].      B). Cassandra [name] had a thyroidectomy on 01/25/2019 for treatment of an enlarged goiter that was causing severe compressive  symptoms. Cassandra [name] subsequently developed both hypothyroidism and hypoparathyroidism that resulted in hypocalcemia. Cassandra [name] was treated with Rocaltrol, calcium carbonate.     C). Cassandra [name] has continued to have problems with uncontrolled and variable glucose control and hypocalcemia   6. Cassandra [name]'s last visit with Dr. Baldo Ash occurred on 02/15/2020. At that time Cassandra [name] was supposed to be taking [place] calcium carbonate/vitamin D 500/200, 9 tablets per day (2, 2, 2, 3); calcitriol, 0.25 mg,  4 tablets with breakfast and three at  dinner;  HCTZ, 50 mg, twice daily; vitamin D3, 1000 IU/day; Synthroid, 175 mcg/day; and other medications. She was also using a new Tandem T-Slim insulin pump with Control IQ technology and a Dexcom G6 continuous glucose monitor (CGM). Her HbA1c at that visit was 7.8%. After seeing her lab results from that visit, Dr. Baldo Ash reduced the Synthroid dose to 175 mcg/day. Dr. Baldo Ash was also concerned that Cassandra [name] was having some recent memory loss and referred Cassandra [name] to Dr. Tomi Likens in [facility]. Unfortunately, [place] cancelled her visit to Dr. Tomi Likens on 03/22/19 because she wasn't feeling good that day.    7. In the past several weeks Cassandra [name] has been complaining of muscle pains and spasms and muscle twitching that she correlated with low calcium levels. She has also been having more swings in glucose from 50 to >400 than Dr. Baldo Ash thought she should have with her new T-slim pump and its Control IQ technology. Dr. Wonda Olds then decided to admit Cassandra [name] for further evaluation and management of both her uncontrolled T1DM and her hypocalcemia.    8. Westyn feels good today. She no longer has any muscle symptoms. She states that she sets up her medicine box every week and takes all of her medications every day.      Review of Symptoms:  A comprehensive review of symptoms was negative except as detailed in HPI.   Past Medical History:   has a past medical history of Anxiety, Complication of  anesthesia, Depression, Diabetes mellitus without complication ([place]), Dysrhythmia, Family history of adverse reaction to anesthesia, GERD (gastroesophageal reflux disease), Hashimoto's disease (01/2019), Hypertension, Hypoparathyroidism ([place]), and Neuropathy.  Perinatal History: No birth history on file.  Past Surgical History:  Past Surgical History:  Procedure Laterality Date  . ADENOIDECTOMY    . COLONOSCOPY    . ESOPHAGOGASTRODUODENOSCOPY    . THYROIDECTOMY N/A 01/25/2019   Procedure: COMPLETE THYROIDECTOMY;  Surgeon: Leta Baptist, MD;  Location: [place];  Service: ENT;  Laterality: N/A;  . TOE SURGERY Bilateral    for ingrown toenails  . TYMPANOSTOMY TUBE PLACEMENT    . WISDOM TOOTH EXTRACTION       Medications prior to Admission:  Prior to Admission medications   Medication Sig Start Date End Date Taking? Authorizing Provider  amitriptyline (ELAVIL) 75 MG tablet TAKE 1 TABLET BY MOUTH AT BEDTIME. Patient taking differently: Take 75 mg by mouth at bedtime. 01/11/20  Yes Jonathon Resides T, FNP  buPROPion (WELLBUTRIN XL) 150 MG 24 hr tablet TAKE 1 TABLET BY MOUTH EVERY DAY Patient taking differently: Take 300 mg by mouth daily. 03/08/20  Yes Parthenia Ames, NP  calcitRIOL (ROCALTROL) 0.25 MCG capsule TAKE 5 TABLETS IN THE AM AND 4 TABLETS IN THE PM Patient taking differently: Take 1-1.25 mcg by mouth See admin instructions. Take 5 tablets in the AM and 4 tablets in the PM 09/01/19  Yes Lelon Huh, MD  calcium-vitamin D (OSCAL WITH D) 500-200 MG-UNIT tablet Take 4 tablets by mouth 3 (three) times daily. Patient taking differently: Take 3 tablets by mouth 3 (three) times daily. 02/27/19 12/18/19 Yes Ashby Dawes, MD  DULoxetine (CYMBALTA) 20 MG capsule TAKE 1 CAPSULE BY MOUTH EVERY DAY Patient taking differently: Take 20 mg by mouth daily. 03/14/20  Yes Trude Mcburney, FNP  fluticasone (FLONASE) 50 MCG/ACT nasal spray Place 1 spray into both nostrils 2 (two) times daily as  needed for allergies or rhinitis.   Yes [provider]  Glucagon (BAQSIMI TWO PACK) 3 MG/DOSE POWD  Place 3 mg into the nose once as needed for up to 1 dose (for severe hypoglycemia when patient is unconcious). 11/23/17  Yes Lelon Huh, MD  hydrochlorothiazide (HYDRODIURIL) 50 MG tablet Take 1 tablet (50 mg total) by mouth 2 (two) times daily. 05/16/19  Yes Lelon Huh, MD  insulin lispro (HUMALOG) 100 UNIT/ML injection UP TO 300 UNITS IN INSULIN PUMP EVERY 48 HOURS, PER DKA AND HYPERGLYCEMIA PROTOCOLS Patient taking differently: Inject 125-130 Units into the skin See admin instructions. UP TO 300 UNITS IN INSULIN PUMP EVERY 48 HOURS, PER DKA AND HYPERGLYCEMIA PROTOCOLS 09/01/19  Yes Lelon Huh, MD  levocetirizine (XYZAL) 5 MG tablet Take 5 mg by mouth every evening.   Yes [provider]  levothyroxine (SYNTHROID) 150 MCG tablet Take 1 tablet (150 mcg total) by mouth daily. 04/11/20  Yes Lelon Huh, MD  loratadine (CLARITIN) 10 MG tablet Take 10 mg by mouth daily.   Yes [provider]  medroxyPROGESTERone (DEPO-PROVERA) 150 MG/ML injection Inject 150 mg into the muscle every 3 (three) months.   Yes [provider]  metoprolol tartrate (LOPRESSOR) 25 MG tablet Take 25 mg by mouth 2 (two) times daily. 06/02/19  Yes [provider]  pantoprazole (PROTONIX) 40 MG tablet Take 40 mg by mouth daily as needed (heartburn).   Yes [provider]  Blood Glucose Monitoring Suppl (ACCU-CHEK GUIDE) w/Device KIT 1 kit by Does not apply route daily as needed. 05/02/18   Lelon Huh, MD  buPROPion (WELLBUTRIN XL) 300 MG 24 hr tablet Take 1 tablet (300 mg total) by mouth every morning. Patient not taking: Reported on 04/20/2020 12/28/18 12/28/19  Trude Mcburney, FNP  Continuous Blood Gluc Receiver (DEXCOM G6 RECEIVER) DEVI 1 Device by Does not apply route daily as needed. 12/16/18   Lelon Huh, MD  Continuous Blood Gluc Sensor (DEXCOM G6  SENSOR) MISC 3 kits by Does not apply route daily as needed (Change sensor every 10 days). 11/14/19   Lelon Huh, MD  Continuous Blood Gluc Transmit (DEXCOM G6 TRANSMITTER) MISC 1 kit by Does not apply route daily as needed. 08/02/19   Lelon Huh, MD  glucose blood (ACCU-CHEK GUIDE) test strip Check glucose 6x daily 05/02/18   Lelon Huh, MD  glucose blood (ACCU-CHEK GUIDE) test strip Use to check sugars 6X daily 09/05/19   Lelon Huh, MD  Insulin Syringe-Needle U-100 (INSULIN SYRINGE .3CC/29GX1/2") 29G X 1/2" 0.3 ML MISC 1 each by Does not apply route 6 (six) times daily. 11/24/17   Lelon Huh, MD  sertraline (ZOLOFT) 50 MG tablet Take 1 tablet (50 mg total) by mouth daily. Patient not taking: No sig reported 01/29/20 04/28/20  Parthenia Ames, NP     Medication Allergies: Amoxicillin-pot clavulanate, Fish allergy, Lactose intolerance (gi), Shellfish-derived products, Tape, Lactase, and Latex  Social History:   reports that she quit smoking about 14 months ago. She has a 0.65 pack-year smoking history. She has never used smokeless tobacco. She reports that she does not drink alcohol and does not use drugs. Pediatric History  Patient Parents  . Roman,Rebecca (Mother)   Other Topics Concern  . Not on file  Social History Narrative   Pt lives with mother. Mother is a [employer]ar[employer] manager. When not at home, someone is home with patient at all times. They have two dogs and three cats. Patient is graduate from [employer]; doing to cosmetology school. Pt enjoys softball, sleep, and be with family.      Family History:  family history includes ADD / ADHD in her maternal grandmother; Anxiety disorder in her maternal grandmother and mother; Cancer in her maternal grandfather; Diabetes in her maternal grandmother; Hyperlipidemia in her maternal grandfather, mother, and another family member; Hypertension in her maternal grandmother; Hypothyroidism in her maternal  grandfather.  Objective:  Physical Exam:  BP 104/76 (BP Location: Right Arm)   Pulse 72   Temp 98.4 F (36.9 C) (Oral)   Resp 19   Ht 5' (1.524 m)   Wt 72.7 kg   SpO2 97%   BMI 31.30 kg/m   Gen:  Alert, bright, chatty, normal affect and insight Head:  Normal Eyes:  Normally formed, no arcus or proptosis, normal moisture Mouth:  Normal oropharynx and tongue, normal dentition for age, normal moisture Neck: No visible abnormalities, no bruits, absent thyroid gland Lungs: Clear, moves air well Heart: Normal S1 and S2, I do not appreciate any pathologic heart sounds or murmurs Abdomen: Soft, non-tender, no hepatosplenomegaly, no masses Hands: Normal metacarpal-phalangeal joints, normal interphalangeal joints, normal palms, normal moisture, no tremor Legs: Normally formed, no edema Feet: Normally formed, 1+ DP pulses Neuro: 5+ strength in UEs and LEs, sensation to touch intact in legs and left foot, but significantly decreased in the right heel Skin: No significant lesions  Labs:  Results for orders placed or performed during the hospital encounter of 04/19/20 (from the past 24 hour(s))  SARS CORONAVIRUS 2 (TAT 6-24 HRS) Nasopharyngeal Nasopharyngeal Swab     Status: None   Collection Time: 04/19/20  9:43 PM   Specimen: Nasopharyngeal Swab  Result Value Ref Range   SARS Coronavirus 2 NEGATIVE NEGATIVE  HIV Antibody (routine testing w rflx)     Status: None   Collection Time: 04/19/20 11:07 PM  Result Value Ref Range   HIV Screen 4th Generation wRfx Non Reactive Non Reactive  Comprehensive metabolic panel     Status: Abnormal   Collection Time: 04/19/20 11:07 PM  Result Value Ref Range   Sodium 136 135 - 145 mmol/L   Potassium 3.3 (L) 3.5 - 5.1 mmol/L   Chloride 104 98 - 111 mmol/L   CO2 24 22 - 32 mmol/L   Glucose, Bld 144 (H) 70 - 99 mg/dL   BUN 9 6 - 20 mg/dL   Creatinine, Ser 0.77 0.44 - 1.00 mg/dL   Calcium 8.2 (L) 8.9 - 10.3 mg/dL   Total Protein 7.3 6.5 - 8.1  g/dL   Albumin 4.1 3.5 - 5.0 g/dL   AST 20 15 - 41 U/L   ALT 17 0 - 44 U/L   Alkaline Phosphatase 68 38 - 126 U/L   Total Bilirubin 0.7 0.3 - 1.2 mg/dL   GFR, Estimated >60 >60 mL/min   Anion gap 8 5 - 15  Magnesium     Status: None   Collection Time: 04/19/20 11:07 PM  Result Value Ref Range   Magnesium 1.8 1.7 - 2.4 mg/dL  Phosphorus     Status: None   Collection Time: 04/19/20 11:07 PM  Result Value Ref Range   Phosphorus 4.6 2.5 - 4.6 mg/dL  Glucose, capillary     Status: Abnormal   Collection Time: 04/19/20 11:21 PM  Result Value Ref Range   Glucose-Capillary 167 (H) 70 - 99 mg/dL  POCT I-Stat EG7     Status: Abnormal   Collection Time: 04/20/20  1:35 AM  Result Value Ref Range   pH, Ven 7.401 7.250 - 7.430   pCO2, Ven 39.9 (L) 44.0 -  60.0 mmHg   pO2, Ven 63.0 (H) 32.0 - 45.0 mmHg   Bicarbonate 24.8 20.0 - 28.0 mmol/L   TCO2 26 22 - 32 mmol/L   O2 Saturation 92.0 %   Acid-Base Excess 0.0 0.0 - 2.0 mmol/L   Sodium 143 135 - 145 mmol/L   Potassium 3.3 (L) 3.5 - 5.1 mmol/L   Calcium, Ion 1.06 (L) 1.15 - 1.40 mmol/L   HCT 38.0 36.0 - 46.0 %   Hemoglobin 12.9 12.0 - 15.0 g/dL   Sample type VENOUS   Glucose, capillary     Status: Abnormal   Collection Time: 04/20/20  5:02 AM  Result Value Ref Range   Glucose-Capillary 177 (H) 70 - 99 mg/dL  T4, free     Status: None   Collection Time: 04/20/20  8:15 AM  Result Value Ref Range   Free T4 0.93 0.61 - 1.12 ng/dL  TSH Once     Status: Abnormal   Collection Time: 04/20/20  8:15 AM  Result Value Ref Range   TSH 5.634 (H) 0.350 - 4.500 uIU/mL  Basic metabolic panel     Status: Abnormal   Collection Time: 04/20/20  8:15 AM  Result Value Ref Range   Sodium 137 135 - 145 mmol/L   Potassium 3.7 3.5 - 5.1 mmol/L   Chloride 102 98 - 111 mmol/L   CO2 24 22 - 32 mmol/L   Glucose, Bld 136 (H) 70 - 99 mg/dL   BUN 8 6 - 20 mg/dL   Creatinine, Ser 0.79 0.44 - 1.00 mg/dL   Calcium 8.7 (L) 8.9 - 10.3 mg/dL   GFR, Estimated >60 >60  mL/min   Anion gap 11 5 - 15  Glucose, capillary     Status: Abnormal   Collection Time: 04/20/20 10:45 AM  Result Value Ref Range   Glucose-Capillary 177 (H) 70 - 99 mg/dL   Comment 1 Notify RN    Comment 2 Document in Chart   Glucose, capillary     Status: Abnormal   Collection Time: 04/20/20  2:47 PM  Result Value Ref Range   Glucose-Capillary 346 (H) 70 - 99 mg/dL   Blood glucose profile"  3/18: 11 PM: 167 3/19:   5 AM: 177, 8 AM: 136, 10 AM: 147, 3 PM: 347, 7 PM: 285, 8 PM: 154  Key lab results: 12/08/19: Calcium 8.3 12/25/19: Calcium 8.7 02/15/20: Calcium 9.1 04/09/20: Calcium 7.0 04/15/20: Calcium 8.6 04/17/20: Calcium 8.2 04/19/20 11:07 EU:MPNTIR 136, [potassium 3.3, chloride 104, CO2 24, calcium 8.2 (ref 8.9-10.3) 04/20/20:   1:35 AM: ionized calcium 1.06 (ref 1.15-1.40)  8:25 WE:RXVQMG 137, potassium 3.7, chloride 102, CO2 24, calcium 8.7; TS 5.634, free T4 0.93, free T3 pending   10:05 PM: BMP pending  Assessment: 1. Uncontrolled T1DM: Cassandra [name]'s HbA1c in January was pretty good for her, but her BG range of 50 to >400 seemed too extreme. Dr. Baldo Ash wanted to see what her BG profile is like in the hospital. 2. Hypocalcemia secondary to post-surgical hypoparathyroidism:   A. As shown above, Cassandra [name]'s calcium levels have been very variable, much more variable than we would expect if Cassandra [name] had been taking all of her medications every day as she states she is doing.   B. We will check her BMPs twice daily in the hospital 3. Hypokalemia: her initial potassium was a bit low, but corrected today. She is taking HCTZ twice daily, which can certainly cause hypokalemia. She is not taking KCl supplementation now, but may need it.  4. Post-surgical hypothyroidism: Cassandra [name] was hyperthyroid in January, her Synthroid dose was reduced then, and she is now hypothyroid on her lower dose of Synthroid. She will probably need a dose adjustment, but I'd like to see her free T3 value before making  any changes.  5.Anxiety/depression. She is under treatment. She looks good today  6. Memory difficulties: It will be good for her to see Dr. Tomi Likens after discharge.  Plan: 1. Diagnostic: Obtain BMPs twice daily. Check SGS prior to meals and at 10-11 PM. And 2-3 AM. 2. Therapeutic: Continue current insulin regimen with her pump and continue all other medications.  3. Patient education: We discussed all of the above today.  4. Follow up: I will round on Gabriela via EPIC and phone calls tomorrow, but will round in person if needed. I will round on her again on Monday. 5. Discharge planning: to be determined  Level of Service: This visit lasted in excess of 120 minutes, to include time researching her case, rounding on her, coordinating care with the house staff and nursing staff, and documenting this consultation.  More than 50% of the visit was devoted to counseling.  Tillman Sers, MD Pediatric and Adult Endocrinology 04/20/2020 4:08 PM

## 2020-04-20 NOTE — H&P (Addendum)
Pediatric Teaching Program H&P [address]  [place], [place] [address] Phone: [phone] Fax: [phone]   Patient Details  Name: Cassandra [name] MRN: [id number] DOB: 09-14-2000 Age: 20 y.o.          Gender: female  Chief Complaint  hypocalcemia  History of the Present Illness  Cassandra [name] is a 20 y.o. female with a PMH of Hashimoto's, IBS, T1DM, hypoglycemia, hypothyroidism, tachycardia and hypoparathyroidism who presents with symptomatic hypocalcemia. Cassandra [name] had a total thyroidectomy for enlarged thyroid gland in the context of Hashimoto's thyroiditis in December of 2020. Since then, she has required frequent changes to her calcium supplementation due to persistent hypocalcemia. Prior to admission, she was taking nine 500mg  calcium-vitamin D pills a day. Her most recent lab evaluation on 03/16 revealed a calcium of 8.2 (decreased from 8.6 two days prior). She developed symptoms including body spasms and cramping, eye twitching, loss of appetite and diarrhea for approximately 1 month. She also endorses orthostasis for 2 weeks. She denies abdominal pain, fever, chest pain or shortness of breath. She has not had any changes in meds or diet during this time. She has been having wide ranged glucoses from 50-400 with most being ~200, but she does not think this triggered her current symptoms. Pt was admitted directly to the floor for treatment.   Review of Systems  All others negative except as stated in HPI (understanding for more complex patients, 10 systems should be reviewed)  Past Birth, Medical & Surgical History  Type 1 diabetes mellitus Frequent hospitalizations for DKA Hashimoto's thyroiditis s/p total thyroidectomy Post-surgical hypothyroidism, hypoparathyroidism Hypocalcemia  Irritable bowel syndrome Anxiety  Neuropathy   Developmental History  None   Diet History  None   Family History  MGM lung cancer MGF: bone  cancer, hypothyroid   Social History  Mom. 2 dogs 5 cats, 3 hamsters  Not in school   Primary Care Provider  Dr. 4/16 at [facility] family practice [facility] Dr. Durwin Nora Endocrinologist  Home Medications  Medication     Dose Calcium vitamin Initially reported 500 mg- 9 pills a day in am; then reported 3 pills TID. Unclear what patient is taking  calcitriol 1.25 mcg every morning; 1 mcg every night  hydochlorthiazide  50 mg BID   Lopressor  25mg  BID     amitryptyline  75 mg nightly  Cymbalta  20 mg nightly   Synthroid  150 mcg daily   wellbutrin   300 mg nightly  Xyzal  5 mg daily    claratin 10 mg daily   flonase 87mcg/act   Potassium gluconate  2.5 meq tabs prn       Allergies   Allergies  Allergen Reactions   Amoxicillin-Pot Clavulanate Diarrhea and Nausea And Vomiting    Did it involve swelling of the face/tongue/throat, SOB, or low BP? No Did it involve sudden or severe rash/hives, skin peeling, or any reaction on the inside of your mouth or nose? No Did you need to seek medical attention at a hospital or doctor's office? No When did it last happen? Few years ago If all above answers are "NO", may proceed with cephalosporin use.   Fish Allergy Diarrhea   Lactose Intolerance (Gi) Diarrhea   Shellfish-Derived Products Diarrhea and Nausea And Vomiting   Tape Itching    Medical tape causes itching   Lactase Nausea And Vomiting   Latex Rash    Immunizations  UTD+ flu  Exam  BP 120/73 (BP Location: Right  Arm)   Pulse 91   Temp 98.2 F (36.8 C) (Oral)   Resp 17   Ht 5' (1.524 m)   Wt 72.7 kg   SpO2 99%   BMI 31.30 kg/m   Weight: 72.7 kg   Facility age limit for growth percentiles is 20 years.  General: Well appearing, well hydrated young female in no acute distress  HEENT: Head atraumatic. MMM. Sclera anicteric  Neck: Supple, no masses or lymphadenopathy CV: Nl S1 and S2. RRR, No murmurs, rubs or gallops. 2+ pedal pulses  Pulm: Clear to  ascultation bilaterally. Nl work of breathing Abd: Soft, non tender, non distended. No masses  Skin: Skin warm and well perfused  MSK: Nl tone. Nl strength exam  Extremities: Warm and well-perfused. No edema.  Neurologic: Alert. Answers questions appropriately. Normal tone. Moves all extremities well. No twitching appreciated.   Selected Labs & Studies  CMP pending  Ionized calcium pending  Mg pending  Phosphorous pending  Assessment  Active Problems:   Hypocalcemia  Cassandra [name] is a 20 y.o. female with complex pmh including type 1 diabetes mellitus, Hashimoto's with post-surgical hypothyroidism and hypoparathyroidism presenting for muscle spasms, cramps, eye twitching, and diarrhea concerning for symptomatic hypocalcemia. Pt has been hospitalized several times in the past for this condition despite PO calcium supplementation. Will get labs to evaluate calcium level and consider giving IV calcium if calcium level is low. Will continue to communicate with Pediatric Endocrinology for further management.   Plan   Post surgical hypocalcemia/hypoparathyroidismPost surgical hypocalcemia/hypoparathyroidism - Continue home meds  Patient unclear of what she is taking, will resume home meds for now. - Peds Endocrinology consulted, appreciate recs - EKG, CRM - CMP/Ca/Mag/Phos              - Consider IV calcium replacement  Post surgical hypothyroidism  Thyroid abnormalities might also be contributing to patient's symptoms - fT4, fT3 - TSH - Holding home levothyroxine  Type 1 Diabetes:  - Dexacom. Pt managing her own insulin with home insulin pump.  - POCT CBG Q6 - hypoglycemic protocol   FENGI: - Regular Diet  - Protonix 40 mg prn    Access: - PIV  Chronic Conditions: Inappropriate tachycardia: Lopressor 25 BID Anxiety: Amitryptyline, Cymbalta, Wellbutrin  Allergies: xyzal and claritin    Interpreter present: no  Jerral Bonito, Medical Student 04/19/2020,  8:18 PM    I reviewed the medical student's findings on physical examination. I discussed with the medical students the patient's diagnosis and agree with the treatment plan as documented in the note.  Fredderick Phenix, MD 04/20/2020 3:30 AM

## 2020-04-20 NOTE — Hospital Course (Addendum)
Cassandra [name] is a 20 y.o. female with a PMH of Hashimoto's thyroiditis, IBD, T1DM, hypoglycemia, post-operative hypothyroidism, inappropriate tachycardia and post-surgical hypoparathyroidism who presents with symptomatic hypocalcemia in the setting of medication non-adherence due to memory issues. Hospital course is noted below:   Hypocalcemia On admission, patient had serum calcium of 8.2 and ionized calcium 1.06 mmol/L.  EKG obtained without QT prolongation.  Decision was made to give patient oral calcium for rapid absorption. It was unclear exactly how much patient was taking at home, so she was started on calcium-vitamin D 500mg  4 tablets TID and calcitriol 1.25 mcg in the AM and 1 mcg in the evening. She was continued on her home HCTZ. She did not require any IV calcium repletion. At the time of discharge, symptoms had resolved and had normal calcium levels x2. Of note, she did not take her calcium supplements or HCTZ on day of discharge because she thought she would be discharged in the morning. Ca 8.9 and K 3.5 prior to discharge.  T1DM She was continued on her home insulin pump during admission. Glucose levels ranged from 133-346 with the majority of readings in the 130-180 range.  Hypothyroidism Mildly elevated TSH 5.63 and normal T3 and T4 levels during admission. She was continued on her home levothyroxine.  Other problems chronic and stable. Home medications were continued during admission.

## 2020-04-21 ENCOUNTER — Telehealth: Payer: Self-pay | Admitting: "Endocrinology

## 2020-04-21 LAB — BASIC METABOLIC PANEL
Anion gap: 8 (ref 5–15)
Anion gap: 9 (ref 5–15)
BUN: 11 mg/dL (ref 6–20)
BUN: 11 mg/dL (ref 6–20)
CO2: 24 mmol/L (ref 22–32)
CO2: 25 mmol/L (ref 22–32)
Calcium: 9.4 mg/dL (ref 8.9–10.3)
Calcium: 8.9 mg/dL (ref 8.9–10.3)
Chloride: 103 mmol/L (ref 98–111)
Chloride: 102 mmol/L (ref 98–111)
Creatinine, Ser: 0.83 mg/dL (ref 0.44–1.00)
Creatinine, Ser: 0.83 mg/dL (ref 0.44–1.00)
GFR, Estimated: >60 mL/min (ref >60)
GFR, Estimated: >60 mL/min (ref >60)
Glucose, Bld: 145 mg/dL — ABNORMAL HIGH (ref 70–99)
Glucose, Bld: 243 mg/dL — ABNORMAL HIGH (ref 70–99)
Potassium: 3.7 mmol/L (ref 3.5–5.1)
Potassium: 3.5 mmol/L (ref 3.5–5.1)
Sodium: 135 mmol/L (ref 135–145)
Sodium: 136 mmol/L (ref 135–145)

## 2020-04-21 LAB — CALCIUM, IONIZED: Calcium, Ionized, Serum: 4.3 mg/dL — ABNORMAL LOW (ref 4.5–5.6)

## 2020-04-21 LAB — T3, FREE: T3, Free: 2.6 pg/mL (ref 2.0–4.4)

## 2020-04-21 LAB — GLUCOSE, CAPILLARY: Glucose-Capillary: 133 mg/dL — ABNORMAL HIGH (ref 70–99)

## 2020-04-21 MED ORDER — VITAMIN D3 25 MCG PO TABS: 1000.0000 [IU] | ORAL_TABLET | Freq: Every day | ORAL | 0 refills | Status: DC

## 2020-04-21 MED ORDER — POTASSIUM CHLORIDE 20 MEQ PO PACK: 20.0000 meq | PACK | Freq: Every day | ORAL | 0 refills | Status: DC

## 2020-04-21 NOTE — Progress Notes (Signed)
At 0800, this RN entered room to find pt sleeping. Pt was woken up to take morning medications. At this time, she stated that she did not want to take medications and that she would take them later. This RN asked her if I could come back at 1000 and if she would take them then, to which she replied that she would. This RN returned at that time and pt took all of her scheduled medications, including Vitamin D supplement and Calcium. She did not have any problems swallowing the smaller pills and asked for the larger pills to be crushed and mixed in her drink. This was done and she took all of her medications without issue.

## 2020-04-21 NOTE — Progress Notes (Signed)
Was informed by RN Consuella Lose that patient had refused her medications today and had stated that she would take them when she got home as she had thought that she would be discharged soon.

## 2020-04-21 NOTE — Discharge Instructions (Signed)
You were admitted to the hospital for symptoms of low calcium.  You were treated with calcium supplementation.  Please remember to continue taking your calcium supplements at home as instructed.  Your potassium was on the lower end of normal, so Dr. Vanessa Cut Off wants you to try taking a potassium supplement once a day for the next 7 days.

## 2020-04-21 NOTE — Progress Notes (Signed)
Pt took 2nd dose of Calcium for the day at this time, crushed and mixed in her drink. No issues noted.

## 2020-04-21 NOTE — Discharge Summary (Signed)
Pediatric Teaching Program Discharge Summary [address]  [place], [place] [address] Phone: [phone] Fax: [phone]   Patient Details  Name: Cassandra [name] MRN: [id number] DOB: 14-Sep-2000 Age: 20 y.o.          Gender: female  Admission/Discharge Information   Admit Date:  04/19/2020  Discharge Date: 04/21/2020  Length of Stay: 2   Reason(s) for Hospitalization  Symptomatic hypocalcemia  Problem List   Active Problems:   Post-surgical hypoparathyroidism ([place])   Hypocalcemia   Final Diagnoses  Symptomatic hypocalcemia  Brief Hospital Course (including significant findings and pertinent lab/radiology studies)  Cassandra [name] is a 20 y.o. female with a PMH of Hashimoto's thyroiditis, IBD, T1DM, hypoglycemia, post-operative hypothyroidism, inappropriate tachycardia and post-surgical hypoparathyroidism who presents with symptomatic hypocalcemia in the setting of medication non-adherence due to memory issues. Hospital course is noted below:   Hypocalcemia On admission, patient had serum calcium of 8.2 and ionized calcium 1.06 mmol/L.  EKG obtained without QT prolongation.  Decision was made to give patient oral calcium for rapid absorption. It was unclear exactly how much patient was taking at home, so she was started on calcium-vitamin D 548m 4 tablets TID and calcitriol 1.25 mcg in the AM and 1 mcg in the evening. She was continued on her home HCTZ. She did not require any IV calcium repletion. At the time of discharge, symptoms had resolved and had normal calcium levels x2. Of note, she did not take her calcium supplements or HCTZ on day of discharge because she thought she would be discharged in the morning. Ca 8.9 and K 3.5 prior to discharge.  T1DM She was continued on her home insulin pump during admission. Glucose levels ranged from 133-346 with the majority of readings in the 130-180 range.  Hypothyroidism Mildly elevated  TSH 5.63 and normal T3 and T4 levels during admission. She was continued on her home levothyroxine.  Other problems chronic and stable. Home medications were continued during admission.     Procedures/Operations  None  Consultants  Pediatric endocrinology  Focused Discharge Exam  Temp:  [97.9 F (36.6 C)-98.7 F (37.1 C)] 97.9 F (36.6 C) (03/20 1600) Pulse Rate:  [72-97] 89 (03/20 1600) Resp:  [18-20] 19 (03/20 1600) BP: (109-119)/(74-82) 119/82 (03/20 1152) SpO2:  [96 %-100 %] 100 % (03/20 1600) General: Well-appearing obese young female resting in bed comfortably, NAD CV: RRR, no murmurs  Pulm: CTAB, no respiratory distress Abd: soft, non-tender, +BS Neuro: alert, conversant, negative [place] sign  Interpreter present: no  Discharge Instructions   Discharge Weight: 72.7 kg   Discharge Condition: Improved  Discharge Diet: Resume diet  Discharge Activity: Ad lib   Discharge Medication List   Allergies as of 04/21/2020      Reactions   Amoxicillin-pot Clavulanate Diarrhea, Nausea And Vomiting   Did it involve swelling of the face/tongue/throat, SOB, or low BP? No Did it involve sudden or severe rash/hives, skin peeling, or any reaction on the inside of your mouth or nose? No Did you need to seek medical attention at a hospital or doctor's office? No When did it last happen?Few years ago If all above answers are "NO", may proceed with cephalosporin use.   Fish Allergy Diarrhea   Lactose Intolerance (gi) Diarrhea   Shellfish-derived Products Diarrhea, Nausea And Vomiting   Tape Itching   Medical tape causes itching   Lactase Nausea And Vomiting   Latex Rash      Medication List    STOP taking  these medications   sertraline 50 MG tablet Commonly known as: Zoloft     TAKE these medications   Accu-Chek Guide test strip Generic drug: glucose blood Use to check sugars 6X daily What changed: Another medication with the same name was removed. Continue taking  this medication, and follow the directions you see here.   Accu-Chek Guide w/Device Kit 1 kit by Does not apply route daily as needed.   amitriptyline 75 MG tablet Commonly known as: ELAVIL TAKE 1 TABLET BY MOUTH AT BEDTIME.   buPROPion 300 MG 24 hr tablet Commonly known as: Wellbutrin XL Take 1 tablet (300 mg total) by mouth every morning. What changed: Another medication with the same name was removed. Continue taking this medication, and follow the directions you see here.   calcitRIOL 0.25 MCG capsule Commonly known as: ROCALTROL TAKE 5 TABLETS IN THE AM AND 4 TABLETS IN THE PM What changed: See the new instructions.   calcium-vitamin D 500-200 MG-UNIT tablet Commonly known as: OSCAL WITH D Take 4 tablets by mouth 3 (three) times daily. What changed: how much to take   Dexcom G6 Receiver Devi 1 Device by Does not apply route daily as needed.   Dexcom G6 Sensor Misc 3 kits by Does not apply route daily as needed (Change sensor every 10 days).   Dexcom G6 Transmitter Misc 1 kit by Does not apply route daily as needed.   DULoxetine 20 MG capsule Commonly known as: CYMBALTA TAKE 1 CAPSULE BY MOUTH EVERY DAY What changed: how much to take   fluticasone 50 MCG/ACT nasal spray Commonly known as: FLONASE Place 1 spray into both nostrils 2 (two) times daily as needed for allergies or rhinitis.   Glucagon 3 MG/DOSE Powd Commonly known as: [place] Two Pack Place 3 mg into the nose once as needed for up to 1 dose (for severe hypoglycemia when patient is unconcious).   hydrochlorothiazide 50 MG tablet Commonly known as: HYDRODIURIL Take 1 tablet (50 mg total) by mouth 2 (two) times daily.   insulin lispro 100 UNIT/ML injection Commonly known as: HumaLOG UP TO 300 UNITS IN INSULIN PUMP EVERY 48 HOURS, PER DKA AND HYPERGLYCEMIA PROTOCOLS What changed:   how much to take  how to take this  when to take this   INSULIN SYRINGE .3CC/29GX1/2" 29G X 1/2" 0.3 ML Misc 1  each by Does not apply route 6 (six) times daily.   levocetirizine 5 MG tablet Commonly known as: XYZAL Take 5 mg by mouth every evening.   levothyroxine 150 MCG tablet Commonly known as: SYNTHROID Take 1 tablet (150 mcg total) by mouth daily.   loratadine 10 MG tablet Commonly known as: CLARITIN Take 10 mg by mouth daily.   medroxyPROGESTERone 150 MG/ML injection Commonly known as: DEPO-PROVERA Inject 150 mg into the muscle every 3 (three) months.   metoprolol tartrate 25 MG tablet Commonly known as: LOPRESSOR Take 25 mg by mouth 2 (two) times daily.   pantoprazole 40 MG tablet Commonly known as: PROTONIX Take 40 mg by mouth daily as needed (heartburn).   potassium chloride 20 MEQ packet Commonly known as: KLOR-CON Take 20 mEq by mouth daily for 7 days.   Vitamin D3 25 MCG tablet Commonly known as: Vitamin D Take 1 tablet (1,000 Units total) by mouth daily. Start taking on: April 22, 2020       Immunizations Given (date): none  Follow-up Issues and Recommendations  Ensure medication adherence  Pending Results   Unresulted Labs (From  admission, onward)          Start     Ordered   04/19/20 2107  Calcium, ionized  Once,   R       Question:  Specimen collection method  Answer:  Lab=Lab collect   04/19/20 2112          Future Appointments    Follow-up Information    Lelon Huh, MD. Go on 05/15/2020.   Specialty: Pediatrics Why: 3:30 PM Contact information: [address] [place] [address] [phone]                Zola Button, MD 04/21/2020, 5:32 PM

## 2020-04-21 NOTE — Telephone Encounter (Addendum)
This telephone note is a summary note that includes several telephone calls that I had today with Dr. Zola Button, MD, the family medicine resident on duty on the Children's Unit, the nurses on the Unit, and Dr. Lelon Huh, MD, the patient's pediatric endocrinologist. 1. Late this morning I reviewed Cassandra [name]'s EPIC records. Her BGs on 04/20/20 had varied from 154-346, with the 346 occurring after lunch. The BGs then decreased to 168 by 11 PM and remained in the 133-145 during the night. She did not have any of the hypoglycemia and severe hyperglycemia that she had had prior to admission. I also reviewed her lab tests from this morning at 0634. Her sodium was 135, potassium 3.7, chloride 103, CO2 24, and calcium 9.4. I arranged with Dr. Nancy Fetter to repeat her BMP about 4 PM. I stated that if her calcium was good she would be able to be discharged in time for her mother to pick her up before 6 PM tonight.  2. About 5 PM this evening Dr. Pandora Leiter called me. He said that her calcium was lower. We reviewed her lab results from 3:46 PM today together. Sodium was 136, potassium 3.5, chloride 103, CO2 25, and calcium 8.9. When I then asked him what he thought may have caused the calcium to decline, he stated that the nurses had informed him that Cassandra [name] had refused all of her medications today.  I also reviewed her BG log and saw that her BG at 3:45 PM, after she had eaten was 243.  3. I then spoke with the charge nurse, Colletta Maryland, and Cassandra [name], Cassandra Dredge, RN.   A. Cassandra [name] informed me that she was Cassandra [name]'s nurse both yesterday and today. When Cassandra [name] was finishing her shift yesterday and saying goodbye to Cassandra [name] informed her that she was going home tomorrow. When Cassandra [name] remarked that Cassandra [name] might not be ready to go home tomorrow, Cassandra [name] re-stated that she would be going home tomorrow.   Cassandra [name] also stated that when she went in to give Cassandra [name] all of her morning medications today, Cassandra [name] refused  them, stating that she would take them when she want home later today. Cassandra [name] assumed that the doctors might have informed Cassandra [name] that she could go home that morning, so went on to care for other patients. When Cassandra [name] later recognized that the patient was still present and had not gone home by 2 PM, Cassandra [name] offered all of Cassandra [name] medications to her again, but Cassandra [name] again refused.  4. After learning all of the above, I contacted Dr. Baldo Ash and discussed the case with her.   A. I told Dr. Baldo Ash that it appeared to me that Cassandra [name] BGs could be fairly well controlled if she checked her BGs and took her insulin boluses. I suspected that her previous very high BGs and very low BGS were probably mostly due to her noncompliance with diabetes treatment.    Cassandra. I stated that it was also clear that if Cassandra [name] took her medications as prescribed, her calcium values could be normal. I suspected that the major cause of Cassandra [name]'s hypocalcemia was also noncompliance.  C. I stated that since we had accomplished the objectives of the admission by discovering the reasons for Cassandra [name]'s unstable BGs and her hypocalcemia, and since she was not cooperating with our efforts to take care of her, I recommended that we discharge Cassandra [name] immediately. Dr. Baldo Ash agreed.  5. When I called Dr. Nancy Fetter again, I gave him directions to immediately discharge Cassandra [name]. I also asked  him to prescribe 20 mEq of potassium for Cassandra [name], one dose per day for the next 7 days to treat her low potassium on an inpatient basis. He graciously agreed to do so.  6. When I met with Cassandra [name] yesterday, I carefully explained the reasons for this admission. She told me that Dr. Baldo Ash had relayed all that information to her already. I told her that if her blood sugars and calcium values were good tomorrow afternoon (04/21/20) I would be glad to discharge her so that she could go to work on Monday. I also told her, however, that if her sugars and calcium values did not  look good, we might have to keep her as an inpatient for several more days. She said that she understood and would stay if we thought it was necessary. Up through that point she had been checking her BGs, taking her insulin boluses, and taking her oral medications.  Today, however, she was not willing to take her medications for [place]. She can be her own worst enemy.   Cassandra Hock, MD, CDE Pediatric and Adult Endocrinology Board-certified in internal medicine, pediatrics, pediatric endocrinology, and endocrinology and metabolism (for adults)

## 2020-04-21 NOTE — Progress Notes (Signed)
At this time, pt refused all medications, stating "I will just take them when I get home". This RN voiced understanding and left patient's room.

## 2020-04-23 NOTE — Progress Notes (Signed)
Pt discharged to home at this time. At the time of discharge, patient stated that she would take medications missed today when she got home. She voiced understanding of importance of taking all of her medications as prescribed.

## 2020-05-15 ENCOUNTER — Telehealth (INDEPENDENT_AMBULATORY_CARE_PROVIDER_SITE_OTHER): Payer: Self-pay

## 2020-05-15 ENCOUNTER — Ambulatory Visit (INDEPENDENT_AMBULATORY_CARE_PROVIDER_SITE_OTHER): Payer: Medicaid Other | Admitting: Pediatric Endocrinology

## 2020-05-15 ENCOUNTER — Other Ambulatory Visit: Payer: Self-pay

## 2020-05-15 ENCOUNTER — Encounter (INDEPENDENT_AMBULATORY_CARE_PROVIDER_SITE_OTHER): Payer: Self-pay | Admitting: Pediatric Endocrinology

## 2020-05-15 VITALS — BP 116/67 | Wt 159.0 lb

## 2020-05-15 DIAGNOSIS — E1065 Type 1 diabetes mellitus with hyperglycemia: Secondary | ICD-10-CM

## 2020-05-15 DIAGNOSIS — E892 Postprocedural hypoparathyroidism: Secondary | ICD-10-CM | POA: Diagnosis not present

## 2020-05-15 DIAGNOSIS — E890 Postprocedural hypothyroidism: Secondary | ICD-10-CM | POA: Diagnosis not present

## 2020-05-15 LAB — POCT GLYCOSYLATED HEMOGLOBIN (HGB A1C): Hemoglobin A1C: 7.2 % — AB (ref 4.0–5.6)

## 2020-05-15 LAB — POCT GLUCOSE (DEVICE FOR HOME USE): POC Glucose: 210 mg/dl — AB (ref 70–99)

## 2020-05-15 MED ORDER — CALCIUM CARBONATE-VITAMIN D 500-200 MG-UNIT PO TABS: 4.0000 | ORAL_TABLET | Freq: Three times a day (TID) | ORAL | 11 refills | Status: DC

## 2020-05-15 MED ORDER — CALCITRIOL 0.25 MCG PO CAPS: 0.7500 ug | ORAL_CAPSULE | Freq: Two times a day (BID) | ORAL | 5 refills | Status: DC

## 2020-05-15 NOTE — Progress Notes (Signed)
Subjective:  Subjective  Patient Name: Cassandra [name] Date of Birth: 2000/05/16  MRN: [id number]  Laquida Cotrell  presents to the office today for follow up evaluation and management of her type 1 diabetes and hypoglycemia, hypothyroidism, hypoparathyroidism  HISTORY OF PRESENT ILLNESS:   Denora is a 20 y.o. Caucasian female   [place] came to her appointment alone.   1. Corinda was seen in the hospital at [facility] pediatrics on October 12-15. She was admitted with hypoglycemia. She had previously been followed for her diabetes care at [place] was diagnosed with type 1 diabetes at age 63. She was having leg cramps and she was having polyuria/polydipsia. Her BG at the PCP office was 564 mg/dL. She was sent to [facility] She was admitted there for 3 days for initial diabetes education. She transitioned care to [facility] in 2018. She started on a T-Slim insulin pump 12/09/17.   2. Malissie was last seen in pediatric endocrine clinic on 02/15/20. She was admitted to [facility] in March due to symptomatic hypocalcemia.    Post surgical hypoparathyroidism   She was admitted last month due to symptomatic hypocalcemia. During her admission, her doses of calcium and Rocaltrol were increased. She reports increased diarrhea and GI loss of calcium intake with the increased doses.   She is complaining of mild eye, feet, and leg twitching today.   1) Calcium 4 tabs 3 x daily  Which increased her calcium intake from 4.5 g to 6 grams per day.  calcium at 4.5 grams per day (OsCal 500/200). Need to take with food (12 tablets per day) - she has them in a pill sorter. Her mom and BF are helping to keep the pill sorter filled and remind her to take her doses.    2) Calcitriol 4 tabs with breakfast and 4 tabs with dinner   3) HCTZ 50 mg twice a day  4) She has been getting 1000 iu of vit D   5) She is taking Synthroid 150 mcg per day.   Appetite is "decent". She feels that she eats frequent and small  meals. She has continued with her adult GI specialist. She has a diagnosis of IBD.  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 She says that they have continued to monitor her regularly.   Thyroid/ post surgical hypothyroidism   Currently on 150 mcg LT4 daily.    Decreased dose from 175 after last labs due to TSH suppression.   Reports that she feels good on her current dose.  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  She thinks that she is less cold when her calcium is in target.  She is having irregular periods (on Depo Provera).   She is still working on building up stamina and endurance.     Type 1 diabetes, with hyperglycemia/hypoglycemia, uncontrolled   She has continued to have some hypoglycemia (mild) at work. Otherwise she feels that her pump works really well. She likes that it is bolusing her throughout the day. She has not been as low as she used to get and overall has better control of her sugar.   Programmed basal and basal delivery are within 1 unit of each other.    3. Pertinent Review of Systems:  Constitutional: The patient feels "good". The patient seems healthy and active.  Eyes: Vision seems to be good. There are no recognized eye problems. Wears glasses- got them fixed- they  are at home. Saw Ophthalmology April 2021- Due now Neck: The patient has no complaints of anterior neck swelling, soreness, tenderness, pressure, discomfort, or difficulty swallowing.   Heart: Heart rate increases with exercise or other physical activity. The patient has no complaints of palpitations, irregular heart beats, chest pain, or chest pressure.   Lungs: no asthma or wheezing.  Gastrointestinal: IBD followed by adult GI Legs: Muscle mass and strength seem normal. There are no complaints of numbness, tingling, burning, or pain. No edema is noted.  Feet: There are no obvious foot problems. There are no  complaints of numbness, tingling, burning, or pain. No edema is noted. Neurologic: There are no recognized problems with muscle movement and strength, sensation, or coordination. Neuropathy in both feet. Sensation loss in right foot.  Intermittent pain in hands and feet. Followed by adult Neurology. Rescheduled to later this month.  GYN/GU:  Has had menorrhagia on Nexplanon. Now on Depo Provera Diabetes alert: Tattoo right wrist.   Annual labs: [name] Last done Jan 2022- no new issues    CGM Download:_       Tandem:            PAST MEDICAL, FAMILY, AND SOCIAL HISTORY  Past Medical History:  Diagnosis Date  . Anxiety   . Complication of anesthesia    woke up during Colonoscopy and EGD  . Depression   . Diabetes mellitus without complication ([place])   . Dysrhythmia    Tachy  . Family history of adverse reaction to anesthesia    Mom woke up during surgery.  [name] GERD (gastroesophageal reflux disease)   . Hashimoto's disease 01/2019  . Hypertension   . Hypoparathyroidism ([place])   . Neuropathy    Feet, legs, hands    Family History  Problem Relation Age of Onset  . Hypertension Maternal Grandmother   . Anxiety disorder Maternal Grandmother   . ADD / ADHD Maternal Grandmother   . Diabetes Maternal Grandmother   . Hyperlipidemia Maternal Grandfather   . Hypothyroidism Maternal Grandfather   . Cancer Maternal Grandfather        blood cancer (unknown name)  . Hyperlipidemia Mother   . Anxiety disorder Mother   . Hyperlipidemia Other   . Migraines Neg Hx   . Seizures Neg Hx   . Depression Neg Hx   . Autism Neg Hx   . Bipolar disorder Neg Hx   . Schizophrenia Neg Hx      Current Outpatient Medications:  .  amitriptyline (ELAVIL) 75 MG tablet, TAKE 1 TABLET BY MOUTH AT BEDTIME. (Patient taking differently: Take 75 mg by mouth at bedtime.), Disp: 90 tablet, Rfl: 2 .  Blood Glucose Monitoring Suppl (ACCU-CHEK GUIDE) w/Device KIT, 1 kit by Does not apply route daily as  needed., Disp: 2 kit, Rfl: 5 .  buPROPion (WELLBUTRIN XL) 300 MG 24 hr tablet, Take 1 tablet (300 mg total) by mouth every morning. (Patient not taking: Reported on 04/20/2020), Disp: 30 tablet, Rfl: 2 .  calcitRIOL (ROCALTROL) 0.25 MCG capsule, TAKE 5 TABLETS IN THE AM AND 4 TABLETS IN THE PM (Patient taking differently: Take 1-1.25 mcg by mouth See admin instructions. Take 5 tablets in the AM and 4 tablets in the PM), Disp: 270 capsule, Rfl: 5 .  calcium-vitamin D (OSCAL WITH D) 500-200 MG-UNIT tablet, Take 4 tablets by mouth 3 (three) times daily. (Patient taking differently: Take 3 tablets by mouth 3 (three) times daily.), Disp: 360 tablet, Rfl: 0 .  Continuous Blood Gluc  Receiver (DEXCOM G6 RECEIVER) DEVI, 1 Device by Does not apply route daily as needed., Disp: 1 each, Rfl: 0 .  Continuous Blood Gluc Sensor (DEXCOM G6 SENSOR) MISC, 3 kits by Does not apply route daily as needed (Change sensor every 10 days)., Disp: 3 each, Rfl: 5 .  Continuous Blood Gluc Transmit (DEXCOM G6 TRANSMITTER) MISC, 1 kit by Does not apply route daily as needed., Disp: 1 each, Rfl: 1 .  DULoxetine (CYMBALTA) 20 MG capsule, TAKE 1 CAPSULE BY MOUTH EVERY DAY (Patient taking differently: Take 20 mg by mouth daily.), Disp: 90 capsule, Rfl: 1 .  fluticasone (FLONASE) 50 MCG/ACT nasal spray, Place 1 spray into both nostrils 2 (two) times daily as needed for allergies or rhinitis., Disp: , Rfl:  .  Glucagon (BAQSIMI TWO PACK) 3 MG/DOSE POWD, Place 3 mg into the nose once as needed for up to 1 dose (for severe hypoglycemia when patient is unconcious)., Disp: 2 each, Rfl: 3 .  glucose blood (ACCU-CHEK GUIDE) test strip, Use to check sugars 6X daily, Disp: 600 each, Rfl: 1 .  hydrochlorothiazide (HYDRODIURIL) 50 MG tablet, Take 1 tablet (50 mg total) by mouth 2 (two) times daily., Disp: 60 tablet, Rfl: 3 .  insulin lispro (HUMALOG) 100 UNIT/ML injection, UP TO 300 UNITS IN INSULIN PUMP EVERY 48 HOURS, PER DKA AND HYPERGLYCEMIA  PROTOCOLS (Patient taking differently: Inject 125-130 Units into the skin See admin instructions. UP TO 300 UNITS IN INSULIN PUMP EVERY 48 HOURS, PER DKA AND HYPERGLYCEMIA PROTOCOLS), Disp: 40 mL, Rfl: 5 .  Insulin Syringe-Needle U-100 (INSULIN SYRINGE .3CC/29GX1/2") 29G X 1/2" 0.3 ML MISC, 1 each by Does not apply route 6 (six) times daily., Disp: 200 each, Rfl: 6 .  levocetirizine (XYZAL) 5 MG tablet, Take 5 mg by mouth every evening., Disp: , Rfl:  .  levothyroxine (SYNTHROID) 150 MCG tablet, Take 1 tablet (150 mcg total) by mouth daily., Disp: 30 tablet, Rfl: 11 .  loratadine (CLARITIN) 10 MG tablet, Take 10 mg by mouth daily., Disp: , Rfl:  .  medroxyPROGESTERone (DEPO-PROVERA) 150 MG/ML injection, Inject 150 mg into the muscle every 3 (three) months., Disp: , Rfl:  .  metoprolol tartrate (LOPRESSOR) 25 MG tablet, Take 25 mg by mouth 2 (two) times daily., Disp: , Rfl:  .  pantoprazole (PROTONIX) 40 MG tablet, Take 40 mg by mouth daily as needed (heartburn)., Disp: , Rfl:  .  potassium chloride (KLOR-CON) 20 MEQ packet, Take 20 mEq by mouth daily for 7 days., Disp: 7 packet, Rfl: 0 .  Vitamin D3 (VITAMIN D) 25 MCG tablet, Take 1 tablet (1,000 Units total) by mouth daily., Disp: 60 tablet, Rfl: 0  Allergies as of 05/15/2020 - Review Complete 05/15/2020  Allergen Reaction Noted  . Amoxicillin-pot clavulanate Diarrhea and Nausea And Vomiting 11/14/2016  . Fish allergy Diarrhea 11/14/2016  . Lactose intolerance (gi) Diarrhea 11/14/2016  . Shellfish-derived products Diarrhea and Nausea And Vomiting 11/14/2016  . Tape Itching 11/14/2016  . Lactase Nausea And Vomiting 03/07/2013  . Latex Rash 11/14/2016     reports that she quit smoking about 15 months ago. She has a 0.65 pack-year smoking history. She has never used smokeless tobacco. She reports that she does not drink alcohol and does not use drugs. Pediatric History  Patient Parents  . Roman,Rebecca (Mother)   Other Topics Concern  . Not  on file  Social History Narrative   Pt lives with mother. Mother is a [employer] manager. When not at home, someone  is home with patient at all times. They have two dogs and three cats. Patient is graduate from [employer]; doing to cosmetology school. Pt enjoys softball, sleep, and be with family.     1. School and Family: Graduated HS.. Currently with mom.   2. Activities: Will not age out of Medicaid at 72  Working at [employer] 3. Primary Care Provider: Aquilla Hacker, MD  ROS: There are no other significant problems involving Lannah's other body systems.    Objective:  Objective  Vital Signs:  BP 116/67   Wt 159 lb (72.1 kg)   BMI 31.05 kg/m   Growth percentile SmartLinks can only be used for patients less than 39 years old.  Ht Readings from Last 3 Encounters:  04/19/20 5' (1.524 m)  12/18/19 5' 0.24" (1.53 m) (6 %, Z= -1.59)*  04/13/19 5' 0.63" (1.54 m) (8 %, Z= -1.43)*   * Growth percentiles are based on CDC (Girls, 2-20 Years) data.   Wt Readings from Last 3 Encounters:  05/15/20 159 lb (72.1 kg)  04/19/20 160 lb 4.4 oz (72.7 kg)  02/15/20 160 lb 9.6 oz (72.8 kg) (87 %, Z= 1.15)*   * Growth percentiles are based on CDC (Girls, 2-20 Years) data.   HC Readings from Last 3 Encounters:  No data found for [facility]   Body surface area is 1.75 meters squared. Facility age limit for growth percentiles is 20 years. Facility age limit for growth percentiles is 20 years.  PHYSICAL EXAM:    Constitutional: The patient appears healthy and well nourished. The patient's height and weight are overweight for age.  Weight is stable Head: The head is normocephalic.  Face: The face appears normal. There are no obvious dysmorphic features. BL facial nerve twitch/ Chvostek sign Eyes: The eyes appear to be normally formed and spaced. Gaze is conjugate. There is no obvious arcus or proptosis. Moisture appears normal.  Ears: The ears are normally placed and appear externally  normal. Mouth: The oropharynx and tongue appear normal. Dentition appears to be normal for age. Oral moisture is normal. Neck: The neck appears to be visibly normal. Well healed surgical scar.  Lungs: Normal work of breathing Heart: good pulses and peripheral perfusion.  Abdomen: The abdomen appears to be normal in size for the patient's age. Bowel sounds are normal. There is no obvious hepatomegaly, splenomegaly, or other mass effect.  Arms: Muscle size and bulk are normal for age. Hands: There is no obvious tremor. Phalangeal and metacarpophalangeal joints are normal. Palmar muscles are normal for age. Palmar skin is normal. Palmar moisture is also normal. Legs: Muscles appear normal for age. No edema is present.  Feet: Feet are normally formed. Dorsalis pedal pulses are normal. Neurologic: Strength is normal for age in both the upper and lower extremities. Muscle tone is normal.  GYN/GU: normal female  LAB DATA:    Lab Results  Component Value Date   HGBA1C 7.2 (A) 05/15/2020   HGBA1C 7.8 (A) 02/15/2020   HGBA1C 9.0 (A) 11/14/2019   HGBA1C 10.2 (A) 06/19/2019   HGBA1C 8.9 (A) 03/13/2019   HGBA1C 8.8 (A) 11/28/2018   HGBA1C 8.2 (A) 08/24/2018   HGBA1C 8.7 (A) 05/09/2018   HGBA1C 9.4 (A) 02/01/2018   HGBA1C 8.0 (H) 01/10/2018   HGBA1C 7.9 (H) 11/01/2017   HGBA1C 7.8 (H) 10/14/2017   HGBA1C 7.5 (A) 09/15/2017   HGBA1C 8.6 05/18/2017   HGBA1C 9.1 02/24/2017   HGBA1C 8.2 11/19/2016   Lab Results  Component  Value Date   CALCIUM 8.9 04/21/2020   CALCIUM 9.4 04/21/2020   CALCIUM 8.6 (L) 04/20/2020   CALCIUM 8.7 (L) 04/20/2020   CALCIUM 8.2 (L) 04/19/2020   CALCIUM 8.2 (L) 04/17/2020   CALCIUM 8.6 04/15/2020   CALCIUM 7.0 (L) 04/09/2020   CALCIUM 9.1 02/15/2020   CALCIUM 9.1 02/15/2020   CALCIUM 8.7 (L) 12/25/2019   CALCIUM 8.3 (L) 12/08/2019   CALCIUM 8.7 (L) 11/29/2019   CALCIUM 7.4 (L) 11/20/2019   CALCIUM 7.7 (L) 10/31/2019   CALCIUM 7.9 (L) 10/13/2019   CALCIUM 8.3  (L) 09/07/2019   CALCIUM 8.1 (L) 08/16/2019   CALCIUM 7.3 (L) 07/21/2019   CALCIUM 7.4 (L) 06/28/2019    Results for orders placed or performed in visit on 05/15/20  POCT glycosylated hemoglobin (Hb A1C)  Result Value Ref Range   Hemoglobin A1C 7.2 (A) 4.0 - 5.6 %   HbA1c POC (<> result, manual entry)     HbA1c, POC (prediabetic range)     HbA1c, POC (controlled diabetic range)    POCT Glucose (Device for Home Use)  Result Value Ref Range   Glucose Fasting, POC     POC Glucose 210 (A) 70 - 99 mg/dl       Assessment and Plan:  Assessment  ASSESSMENT: Kellyann is a 20 y.o. Caucasian female with type 1 diabetes, post surgical hypothyroidism, and post surgical hypoparathyroidism complicated by hypocalcemia   Post surgical hypothyroidism  - On LT4 150 mcg daily - Clinically euthyroid - Repeat thyroid levels today  Post surgical hypocalcemia/hypoparathyroidism - Calcium has recently been variable. Suspect issues with absorption.  - She is currently mildly symptomatic.  - Continues on Rocaltrol and Vit D3 replacement -Change Rocaltrol  3 tabs AM and 3 tabs PM (0.25 mcg)  -D3 1000 IU/day - HCTZ 50 mg BID - Calcium 12 x 500 mg of elemental calcium = 6g of calcium/day - Repeat levels today  Diabetes, uncontrolled, with hyper and hypoglycemia  - on T-Slim pump and Dexcom - T slim pump was replaced in October - She is now on Control IQ and doing very well - No recent severe hypoglycemia - A1C now nearly at goal of 7% - Reviewed pump and dexcom downloads with patient in detail - Adjustments made to insulin doses as below.    Pump settings:   Try using sleep mode or temp target of 150 for when you are at work.    Time Basal Rate Correction factor Carb ratio Target BG   12a 1.35 30 mg/dL 10 180 mg/dL  4a 1.2 30 mg/dL 10 180 mg/dL  6a 1.25 30 mg/dL 10 120 mg/dL  8a 1.2 30 mg/dL 10 120 mg/dL  3pm 1.25 36m/dL 10 -> 9 1225mdL  9p 1.3 30 mg/dL 10 -> 9 180 mg/dL   Total  Basal   30.1      Duration of insulin   3 hours     Maximum Bolus 12 Units     Carb (for calculation) On      Low Insulin Alert On- 30 Units      Auto Off Off     Quick Bolus Off     Basal IQ On         Memory loss/neuropathy  - she previously saw pediatric neurology for her neuropathy and was meant to be on Neurontin- but she could not swallow it.  - she is unsure about her memory loss. Referred for neurocognitive testing. Has not scheduled since turning 18.  Says that she still is struggling with this.  - Referral placed to [facility] last fall. Has scheduled visit with Dr. Tomi Likens- but it had to be rescheduled twice. She believes it is later this month.     Hypertension:  -No longer taking Lisinopril -BP normal today - On HCTZ to decrease calcium reabsorption in the kidneys     Lab Orders     TSH     T4, free     Comprehensive metabolic panel     Vitamin D 1,25 dihydroxy     VITAMIN D 25 Hydroxy (Vit-D Deficiency, Fractures)     Phosphorus     Magnesium     POCT glycosylated hemoglobin (Hb A1C)     POCT Glucose (Device for Home Use)   Follow-up: Return in about 3 months (around 08/14/2020).  Patient feels anxious about potential transition to adult endocrine care.      Lelon Huh, MD  >40 minutes spent today reviewing the medical chart, counseling the patient/family, and documenting today's encounter.   When a patient is on insulin, intensive monitoring of blood glucose levels is necessary to avoid hyperglycemia and hypoglycemia. Severe hyperglycemia/hypoglycemia can lead to hospital admissions and be life threatening.    Patient referred by Aquilla Hacker, MD for type 1 diabetes.   Copy of this note sent to Aquilla Hacker, MD

## 2020-05-15 NOTE — Patient Instructions (Addendum)
Try using sleep mode or temp target of 150 for when you are at work.    Time Basal Rate Correction factor Carb ratio Target BG   12a 1.35 30 mg/dL 10 208 mg/dL  4a 1.2 30 mg/dL 10 022 mg/dL  6a 3.36 30 mg/dL 10 122 mg/dL  8a 1.2 30 mg/dL 10 449 mg/dL  3pm 7.53 30mg /dL 10 -> 9 120mg /dL  9p 1.3 30 mg/dL 10 -> 9 mg/dL   Total Basal        Duration of insulin   3 hours     Maximum Bolus 12 Units     Carb (for calculation) On      Low Insulin Alert On- 30 Units      Auto Off Off     Quick Bolus Off     Basal IQ On         Decrease Rocaltrol to 3 tabs twice a day.

## 2020-05-16 ENCOUNTER — Other Ambulatory Visit: Payer: Self-pay | Admitting: Pediatrics

## 2020-05-16 NOTE — Telephone Encounter (Signed)
Received fax from [facility], they need questions answered & faxed back.   Completed questions and faxed.

## 2020-05-18 LAB — COMPREHENSIVE METABOLIC PANEL
AG Ratio: 1.5 (calc) (ref 1.0–2.5)
ALT: 10 U/L (ref 6–29)
AST: 16 U/L (ref 10–30)
Albumin: 4.5 g/dL (ref 3.6–5.1)
Alkaline phosphatase (APISO): 68 U/L (ref 31–125)
BUN: 11 mg/dL (ref 7–25)
CO2: 26 mmol/L (ref 20–32)
Calcium: 8.8 mg/dL (ref 8.6–10.2)
Chloride: 103 mmol/L (ref 98–110)
Creat: 0.86 mg/dL (ref 0.50–1.10)
Globulin: 3 g/dL (calc) (ref 1.9–3.7)
Glucose, Bld: 152 mg/dL — ABNORMAL HIGH (ref 65–139)
Potassium: 4 mmol/L (ref 3.5–5.3)
Sodium: 140 mmol/L (ref 135–146)
Total Bilirubin: 0.3 mg/dL (ref 0.2–1.2)
Total Protein: 7.5 g/dL (ref 6.1–8.1)

## 2020-05-18 LAB — MAGNESIUM: Magnesium: 2 mg/dL (ref 1.5–2.5)

## 2020-05-18 LAB — VITAMIN D 1,25 DIHYDROXY
Vitamin D 1, 25 (OH)2 Total: 9 pg/mL — ABNORMAL LOW (ref 18–72)
Vitamin D2 1, 25 (OH)2: <8 pg/mL
Vitamin D3 1, 25 (OH)2: 9 pg/mL

## 2020-05-18 LAB — PHOSPHORUS: Phosphorus: 6.1 mg/dL — ABNORMAL HIGH (ref 2.7–5.0)

## 2020-05-18 LAB — T4, FREE: Free T4: 1.2 ng/dL (ref 0.8–1.4)

## 2020-05-18 LAB — VITAMIN D 25 HYDROXY (VIT D DEFICIENCY, FRACTURES): Vit D, 25-Hydroxy: 23 ng/mL — ABNORMAL LOW (ref 30–100)

## 2020-05-18 LAB — TSH: TSH: 12.33 mIU/L — ABNORMAL HIGH

## 2020-05-19 ENCOUNTER — Encounter (INDEPENDENT_AMBULATORY_CARE_PROVIDER_SITE_OTHER): Payer: Self-pay

## 2020-05-19 DIAGNOSIS — E1065 Type 1 diabetes mellitus with hyperglycemia: Secondary | ICD-10-CM

## 2020-05-20 MED ORDER — BAQSIMI TWO PACK 3 MG/DOSE NA POWD: 3.0000 mg | Freq: Once | NASAL | 3 refills | Status: DC | PRN

## 2020-05-22 ENCOUNTER — Other Ambulatory Visit: Payer: Self-pay

## 2020-05-22 ENCOUNTER — Other Ambulatory Visit: Payer: Self-pay | Admitting: Pediatrics

## 2020-05-22 ENCOUNTER — Ambulatory Visit (INDEPENDENT_AMBULATORY_CARE_PROVIDER_SITE_OTHER): Payer: Medicaid Other | Admitting: Pediatrics

## 2020-05-22 ENCOUNTER — Encounter: Payer: Self-pay | Admitting: Pediatrics

## 2020-05-22 VITALS — BP 130/86 | HR 118 | Ht 60.43 in | Wt 156.6 lb

## 2020-05-22 DIAGNOSIS — E1065 Type 1 diabetes mellitus with hyperglycemia: Secondary | ICD-10-CM

## 2020-05-22 DIAGNOSIS — N946 Dysmenorrhea, unspecified: Secondary | ICD-10-CM | POA: Diagnosis not present

## 2020-05-22 DIAGNOSIS — N809 Endometriosis, unspecified: Secondary | ICD-10-CM

## 2020-05-22 DIAGNOSIS — N921 Excessive and frequent menstruation with irregular cycle: Secondary | ICD-10-CM | POA: Diagnosis not present

## 2020-05-22 DIAGNOSIS — E1040 Type 1 diabetes mellitus with diabetic neuropathy, unspecified: Secondary | ICD-10-CM | POA: Diagnosis not present

## 2020-05-22 DIAGNOSIS — N80129 Deep endometriosis of ovary, unspecified ovary: Secondary | ICD-10-CM

## 2020-05-22 DIAGNOSIS — E892 Postprocedural hypoparathyroidism: Secondary | ICD-10-CM

## 2020-05-22 DIAGNOSIS — IMO0002 Reserved for concepts with insufficient information to code with codable children: Secondary | ICD-10-CM

## 2020-05-22 DIAGNOSIS — F4323 Adjustment disorder with mixed anxiety and depressed mood: Secondary | ICD-10-CM

## 2020-05-22 MED ORDER — NYSTATIN 100000 UNIT/GM EX POWD: 1.0000 "application " | Freq: Three times a day (TID) | CUTANEOUS | 0 refills | Status: DC

## 2020-05-22 MED ORDER — GLUCAGON EMERGENCY 1 MG IJ KIT: PACK | INTRAMUSCULAR | 2 refills | Status: DC

## 2020-05-22 NOTE — Progress Notes (Signed)
History was provided by the patient and boyfriend.  Cassandra [name] is a 20 y.o. female who is here for menorrhagia with irregular cycle, dysmenorrhea, T1DM, hypoparathyroidism, hypothyroidism.  Cassandra Sameera Betton, MD   HPI:  Pt reports that has been bleeding for 4 weeks since depo. Is now spotting but was heavier during the first two weeks. Some cramping. No itching, burning, discharge. She doesn't necessarily want anything to stop the bleeding today, just wants to know if it is normal.    Prior to contraception she had severely heavy bleeding through clothes, sheets etc. As well as bad cramping. Denies cramping between periods. Menarche at 73. Mom and grandmother with endometriosis. She has not had any work up for bleeding disorders. She has not had an ultrasound.   Pt desires pregnancy in the next few years as she is concerned that her mother and grandmother had to have hysterectomy at 34 d/t endometriosis. She and her boyfriend are in the process of exploring what this would look like, particularly in light of her diabetes and post surgical thyroid concerns with difficult to manage calcium and phos levels. Her A1C has been in good control. She will transition to adult endo in the somewhat near future.    Her anxiety has been under fairly good control and she does not think she needs any changes to her medications today. 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   PHQ-SADS Last 3 Score only 06/01/2020 12/18/2019 04/28/2019  PHQ-15 Score '5 18 16  ' Total GAD-7 Score '11 12 17  ' PHQ-9 Total Score '7 10 15     ' No LMP recorded. Patient has had an injection.  Patient Active Problem List   Diagnosis Date Noted  . Post-surgical hypoparathyroidism ([place]) 02/07/2019  . Hypocalcemia 02/07/2019  . S/P thyroidectomy 01/27/2019  . 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/25/2019  . Hypomagnesemia   . Hypokalemia   . History of depression   . History of  hypothyroidism   . MRSA cellulitis   . History of gastroesophageal reflux (GERD)   . Migraine syndrome 03/17/2017  . Symptomatic mammary hypertrophy 03/17/2017  . Sleeping difficulty 02/17/2017  . Moderate headache 12/14/2016  . Anxiety state 12/14/2016  . Neuropathy 12/10/2016  . Type 1 diabetes, uncontrolled, with neuropathy ([place]) 12/10/2016  . Hypertension 11/15/2016  . Hypoglycemia 11/14/2016  . 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 Dyslipidemia 04/08/2014    Current Outpatient Medications on File Prior to Visit  Medication Sig Dispense Refill  . amitriptyline (ELAVIL) 75 MG tablet TAKE 1 TABLET BY MOUTH AT BEDTIME. (Patient taking differently: Take 75 mg by mouth at bedtime.) 90 tablet 2  . Blood Glucose Monitoring Suppl (ACCU-CHEK GUIDE) w/Device KIT 1 kit by Does not apply route daily as needed. 2 kit 5  . calcitRIOL (ROCALTROL) 0.25 MCG capsule Take 3 capsules (0.75 mcg total) by mouth in the morning and at bedtime. 270 capsule 5  . calcium-vitamin D (OSCAL WITH D) 500-200 MG-UNIT tablet Take 4 tablets by mouth 3 (three) times daily. 360 tablet 11  . Continuous Blood Gluc Receiver (DEXCOM G6 RECEIVER) DEVI 1 Device by Does not apply route daily as needed. 1 each 0  . Continuous Blood Gluc Sensor (DEXCOM G6 SENSOR) MISC 3 kits by Does not apply route daily as needed (Change sensor every 10 days). 3 each 5  . Continuous Blood Gluc Transmit (DEXCOM G6 TRANSMITTER) MISC 1  kit by Does not apply route daily as needed. 1 each 1  . DULoxetine (CYMBALTA) 20 MG capsule TAKE 1 CAPSULE BY MOUTH EVERY DAY (Patient taking differently: Take 20 mg by mouth daily.) 90 capsule 1  . fluticasone (FLONASE) 50 MCG/ACT nasal spray Place 1 spray into both nostrils 2 (two) times daily as needed for allergies or rhinitis.    . Glucagon (BAQSIMI TWO PACK) 3 MG/DOSE POWD Place 3 mg into the nose once as needed for up to 1 dose (for severe hypoglycemia when  patient is unconcious). 2 each 3  . glucose blood (ACCU-CHEK GUIDE) test strip Use to check sugars 6X daily 600 each 1  . hydrochlorothiazide (HYDRODIURIL) 50 MG tablet Take 1 tablet (50 mg total) by mouth 2 (two) times daily. 60 tablet 3  . insulin lispro (HUMALOG) 100 UNIT/ML injection UP TO 300 UNITS IN INSULIN PUMP EVERY 48 HOURS, PER DKA AND HYPERGLYCEMIA PROTOCOLS (Patient taking differently: Inject 125-130 Units into the skin See admin instructions. UP TO 300 UNITS IN INSULIN PUMP EVERY 48 HOURS, PER DKA AND HYPERGLYCEMIA PROTOCOLS) 40 mL 5  . Insulin Syringe-Needle U-100 (INSULIN SYRINGE .3CC/29GX1/2") 29G X 1/2" 0.3 ML MISC 1 each by Does not apply route 6 (six) times daily. 200 each 6  . levocetirizine (XYZAL) 5 MG tablet Take 5 mg by mouth every evening.    [name] levothyroxine (SYNTHROID) 150 MCG tablet Take 1 tablet (150 mcg total) by mouth daily. 30 tablet 11  . loratadine (CLARITIN) 10 MG tablet Take 10 mg by mouth daily.    . medroxyPROGESTERone (DEPO-PROVERA) 150 MG/ML injection Inject 150 mg into the muscle every 3 (three) months.    . metoprolol tartrate (LOPRESSOR) 25 MG tablet Take 25 mg by mouth 2 (two) times daily.    . pantoprazole (PROTONIX) 40 MG tablet Take 40 mg by mouth daily as needed (heartburn).    . Vitamin D3 (VITAMIN D) 25 MCG tablet Take 1 tablet (1,000 Units total) by mouth daily. 60 tablet 0   No current facility-administered medications on file prior to visit.    Allergies  Allergen Reactions  . Amoxicillin-Pot Clavulanate Diarrhea and Nausea And Vomiting    Did it involve swelling of the face/tongue/throat, SOB, or low BP? No Did it involve sudden or severe rash/hives, skin peeling, or any reaction on the inside of your mouth or nose? No Did you need to seek medical attention at a hospital or doctor's office? No When did it last happen?Few years ago If all above answers are "NO", may proceed with cephalosporin use.  . Fish Allergy Diarrhea  . Lactose  Intolerance (Gi) Diarrhea  . Shellfish-Derived Products Diarrhea and Nausea And Vomiting  . Tape Itching    Medical tape causes itching  . Lactase Nausea And Vomiting  . Latex Rash     Physical Exam:    Vitals:   05/22/20 1440  BP: 130/86  Pulse: (!) 118  Weight: 156 lb 9.6 oz (71 kg)  Height: 5' 0.43" (1.535 m)    Growth percentile SmartLinks can only be used for patients less than 61 years old.  Physical Exam Vitals and nursing note reviewed.  Constitutional:      General: She is not in acute distress.    Appearance: She is well-developed.  Neck:     Thyroid: No thyromegaly.  Cardiovascular:     Rate and Rhythm: Normal rate and regular rhythm.     Heart sounds: No murmur heard.   Pulmonary:  Breath sounds: Normal breath sounds.  Abdominal:     Palpations: Abdomen is soft. There is no mass.     Tenderness: There is no abdominal tenderness. There is no guarding.  Musculoskeletal:     Right lower leg: No edema.     Left lower leg: No edema.  Lymphadenopathy:     Cervical: No cervical adenopathy.  Skin:    General: Skin is warm.     Capillary Refill: Capillary refill takes less than 2 seconds.     Findings: No rash.  Neurological:     Mental Status: She is alert.     Comments: No tremor  Psychiatric:        Mood and Affect: Mood and affect normal.     Assessment/Plan: 1. Menorrhagia with irregular cycle Now having bleeding associated with d/c depo. It is not severe and she does not wish for any intervention today. We will get labs to assess hormones as well as eval for bleeding disorder given how heavy it was in the past.  - APTT - [place] COMPREHENSIVE PANEL - Ferritin - Prolactin - Protime-INR - US Pelvic Complete With Transvaginal; Future - LH - Follicle stimulating hormone - Testos,Total,Free and SHBG (Female) - DHEA-sulfate - Ambulatory referral to Obstetrics / Gynecology  2. Dysmenorrhea Discussed given family history there is a  possibility that her pain is also endometriosis. Will get pelvic ultrasound now and refer to OB/GYN for further work up.  - Ambulatory referral to Obstetrics / Gynecology  3. Endometrioma Ultrasound with area that may be endometrioma, further supporting concern for endometriosis.  - Ambulatory referral to Obstetrics / Gynecology  4. Type 1 diabetes, uncontrolled, with neuropathy (HCC) Good control with pump currently.   5. Post-surgical hypoparathyroidism ([place]) Will be a challenge related to a healthy pregnancy. Discussed will be important to discuss with endo and OB providers. She was in agreement.   6. Adjustment disorder with mixed anxiety and depressed mood PHQSADs improving. Continue cymbalta.   Return pending labs, ultrasound and GYN referral.   Jonathon Resides, FNP

## 2020-05-22 NOTE — Addendum Note (Signed)
Addended by: Vallery Sa on: 05/22/2020 04:41 PM   Modules accepted: Orders

## 2020-05-26 LAB — FOLLICLE STIMULATING HORMONE: FSH: 4.3 m[IU]/mL

## 2020-05-26 LAB — LUTEINIZING HORMONE: LH: 4.6 m[IU]/mL

## 2020-05-26 LAB — TESTOS,TOTAL,FREE AND SHBG (FEMALE)
Free Testosterone: 5.1 pg/mL (ref 0.1–6.4)
Sex Hormone Binding: 43 nmol/L (ref 17–124)
Testosterone, Total, LC-MS-MS: 39 ng/dL (ref 2–45)

## 2020-05-26 LAB — PROTIME-INR
INR: 1
Prothrombin Time: 9.8 s (ref 9.0–11.5)

## 2020-05-26 LAB — APTT: aPTT: 28 s (ref 23–32)

## 2020-05-26 LAB — PROLACTIN: Prolactin: 12.3 ng/mL

## 2020-05-26 LAB — DHEA-SULFATE: DHEA-SO4: 228 ug/dL (ref 44–286)

## 2020-05-26 LAB — EXTRA SPECIMEN

## 2020-05-26 LAB — FERRITIN: Ferritin: 26 ng/mL (ref 16–154)

## 2020-05-28 ENCOUNTER — Encounter (INDEPENDENT_AMBULATORY_CARE_PROVIDER_SITE_OTHER): Payer: Self-pay

## 2020-05-28 ENCOUNTER — Other Ambulatory Visit: Payer: Self-pay | Admitting: Pediatrics

## 2020-05-28 MED ORDER — FLUCONAZOLE 150 MG PO TABS: ORAL_TABLET | ORAL | 0 refills | Status: DC

## 2020-05-29 LAB — VON WILLEBRAND COMPREHENSIVE PANEL
Factor-VIII Activity: 119 % normal (ref 50–180)
Ristocetin Co-Factor: 134 % normal (ref 42–200)
Von Willebrand Antigen, Plasma: 137 % (ref 50–217)
aPTT: 30 s (ref 23–32)

## 2020-05-30 ENCOUNTER — Other Ambulatory Visit: Payer: Self-pay

## 2020-05-30 ENCOUNTER — Other Ambulatory Visit: Payer: Self-pay | Admitting: Family

## 2020-05-30 ENCOUNTER — Ambulatory Visit (HOSPITAL_BASED_OUTPATIENT_CLINIC_OR_DEPARTMENT_OTHER)
Admission: RE | Admit: 2020-05-30 | Discharge: 2020-05-30 | Disposition: A | Discharge status: Home / Self Care | Payer: Medicaid Other | Source: Ambulatory Visit | Attending: Pediatrics | Admitting: Pediatrics

## 2020-05-30 DIAGNOSIS — N921 Excessive and frequent menstruation with irregular cycle: Secondary | ICD-10-CM | POA: Diagnosis present

## 2020-06-06 ENCOUNTER — Encounter (INDEPENDENT_AMBULATORY_CARE_PROVIDER_SITE_OTHER): Payer: Self-pay

## 2020-06-11 ENCOUNTER — Encounter (INDEPENDENT_AMBULATORY_CARE_PROVIDER_SITE_OTHER): Payer: Self-pay

## 2020-06-11 ENCOUNTER — Other Ambulatory Visit (INDEPENDENT_AMBULATORY_CARE_PROVIDER_SITE_OTHER): Payer: Self-pay | Admitting: Pediatric Endocrinology

## 2020-06-11 DIAGNOSIS — E892 Postprocedural hypoparathyroidism: Secondary | ICD-10-CM

## 2020-06-12 NOTE — Progress Notes (Signed)
NEUROLOGY CONSULTATION NOTE  Cassandra [name] MRN: [id number] DOB: 07-10-2000  Referring provider: Aquilla Hacker, MD Primary care provider: Aquilla Hacker, MD  Reason for consult:  Migraines, neuropathy  Assessment/Plan:   1.  Migraine without aura, without status migrainosus, intractable 2.  Memory deficits 3.  Type 1 diabetes mellitus with neuropathy  1.  Migraine prevention:  Qulipta 59m daily.  Also taking amitriptyline 767mat bedtime and duloxetine 2021mID 2.  Migraine rescue:  Maxalt MLT 6m26mth Phenergan 12.5mg 68m Limit use of pain relievers to no more than 2 days out of week to prevent risk of rebound or medication-overuse headache. 4.  Keep headache diary 5.  Neuropsychological testing 6.  Follow up 6 months.   Subjective:  Cassandra [name] 20 ye41 old female with type 1 diabetes mellitus, Hashimoto's thyroiditis, hypoparathyroidism, HTN, IBS and depression/anxiety who presents for migraines and neuropathy.  History supplemented by referring provider's notes.  She has migraines since 13 to89 ye78s old.  The are typically right temporal sharp pain causing bilateral jaw pain.  Associated nausea, photophobia, blurred vision, but no vomiting, phonophobia, numbness or weakness.  They are usually moderate, sometimes severe.  They last 2-3 days.  They occur 2 to 3 times a month.  No known triggers.  Sleep is the only thing that helps.  Aleve doesn't help.  She does have a dull daily headache.  She has diabetic polyneuropathy.  Her type 1 diabetes has been difficult to control.  Most recent A1c from April was 7.2.  She has Hashimoto's thyroiditis with acquired hypothyroidism for which she takes levothyroxine.  TSH from April was 12.33 with free T4 of 1.2.  She reports short-term memory problems.  Forgets things from 10 minutes ago.  She is a [employer]. Cassandra [name] needs to do things for 4 weeks before she remembers how to do it.    Frequency of analgesics:   2-3 times a month Current NSAIDS/analgesics:  Aleve Current triptans:  none Current ergotamine:  none Current anti-emetic:  Phenergan Current muscle relaxants:  none Current Antihypertensive medications:  Metoprolol tartrate, HCTZ Current Antidepressant medications:  Amitriptyline 75mg 63m duloxetine 20mg Q14mrrent Anticonvulsant medications:  none Current anti-CGRP:  None Current antihistamines/decongestants:  Claritin Current birth control:  Depo-Provera   Past NSAIDS/analgesics:  Ibuprofen, acetaminophen Past abortive triptans:  Sumatriptan 50mg Pa23mbortive ergotamine:  none Past muscle relaxants:  tizanidine Past anti-emetic:  promethazine Past antihypertensive medications:  lisinopril Past antidepressant medications:  Sertraline, Wellbutrin, Lexapro Past anticonvulsant medications:  gabapentin Past anti-CGRP:  none Other past therapies:  none  Caffeine:  No coffee.  Only drinks 1/2 Mt Dew iDelawaresugar drops Diet:  8-9 bottles of water.  Variable appetite. Exercise:  no Depression:  Yes - stable; Anxiety:  Yes -stable Other pain:  no Sleep hygiene:  good Family history of headache:  Mother, grandfather, grandmother   PAST MEDICAL HISTORY: Past Medical History:  Diagnosis Date  . Anxiety   . Autoimmune thyroiditis 09/08/2011  . Complication of anesthesia    woke up during Colonoscopy and EGD  . Depression   . Diabetes mellitus without complication (HCC)   .Centralsrhythmia    Tachy  . Family history of adverse reaction to anesthesia    Mom woke up during surgery.  . GERD (Marland Kitchenastroesophageal reflux disease)   . Hashimoto's disease 01/2019  . Hypertension   . Hypoparathyroidism (HCC)   .Ruleuropathy    Feet, legs, hands  .  Thyroid goiter 05/09/2018    PAST SURGICAL HISTORY: Past Surgical History:  Procedure Laterality Date  . ADENOIDECTOMY    . COLONOSCOPY    . ESOPHAGOGASTRODUODENOSCOPY    . THYROIDECTOMY N/A 01/25/2019   Procedure: COMPLETE THYROIDECTOMY;   Surgeon: Leta Baptist, MD;  Location: [facility];  Service: ENT;  Laterality: N/A;  . TOE SURGERY Bilateral    for ingrown toenails  . TYMPANOSTOMY TUBE PLACEMENT    . WISDOM TOOTH EXTRACTION      MEDICATIONS: Current Outpatient Medications on File Prior to Visit  Medication Sig Dispense Refill  . fluconazole (DIFLUCAN) 150 MG tablet Take 1 tablet today and 1 tablet 3 days from now 2 tablet 0  . amitriptyline (ELAVIL) 75 MG tablet TAKE 1 TABLET BY MOUTH AT BEDTIME. (Patient taking differently: Take 75 mg by mouth at bedtime.) 90 tablet 2  . Blood Glucose Monitoring Suppl (ACCU-CHEK GUIDE) w/Device KIT 1 kit by Does not apply route daily as needed. 2 kit 5  . calcitRIOL (ROCALTROL) 0.25 MCG capsule Take 3 capsules (0.75 mcg total) by mouth in the morning and at bedtime. 270 capsule 5  . calcium-vitamin D (OSCAL WITH D) 500-200 MG-UNIT tablet Take 4 tablets by mouth 3 (three) times daily. 360 tablet 11  . Continuous Blood Gluc Receiver (DEXCOM G6 RECEIVER) DEVI 1 Device by Does not apply route daily as needed. 1 each 0  . Continuous Blood Gluc Sensor (DEXCOM G6 SENSOR) MISC 3 kits by Does not apply route daily as needed (Change sensor every 10 days). 3 each 5  . Continuous Blood Gluc Transmit (DEXCOM G6 TRANSMITTER) MISC 1 kit by Does not apply route daily as needed. 1 each 1  . DULoxetine (CYMBALTA) 20 MG capsule TAKE 1 CAPSULE BY MOUTH EVERY DAY (Patient taking differently: Take 20 mg by mouth daily.) 90 capsule 1  . fluticasone (FLONASE) 50 MCG/ACT nasal spray Place 1 spray into both nostrils 2 (two) times daily as needed for allergies or rhinitis.    . Glucagon (BAQSIMI TWO PACK) 3 MG/DOSE POWD Place 3 mg into the nose once as needed for up to 1 dose (for severe hypoglycemia when patient is unconcious). 2 each 3  . Glucagon, rDNA, (GLUCAGON EMERGENCY) 1 MG KIT Inject in case of sever lows, or unconsciousness. 2 kit 2  . glucose blood (ACCU-CHEK GUIDE) test strip Use to check sugars 6X daily 600 each 1   . hydrochlorothiazide (HYDRODIURIL) 50 MG tablet Take 1 tablet (50 mg total) by mouth 2 (two) times daily. 60 tablet 3  . insulin lispro (HUMALOG) 100 UNIT/ML injection UP TO 300 UNITS IN INSULIN PUMP EVERY 48 HOURS, PER DKA AND HYPERGLYCEMIA PROTOCOLS (Patient taking differently: Inject 125-130 Units into the skin See admin instructions. UP TO 300 UNITS IN INSULIN PUMP EVERY 48 HOURS, PER DKA AND HYPERGLYCEMIA PROTOCOLS) 40 mL 5  . Insulin Syringe-Needle U-100 (INSULIN SYRINGE .3CC/29GX1/2") 29G X 1/2" 0.3 ML MISC 1 each by Does not apply route 6 (six) times daily. 200 each 6  . levocetirizine (XYZAL) 5 MG tablet Take 5 mg by mouth every evening.    [name] levothyroxine (SYNTHROID) 150 MCG tablet Take 1 tablet (150 mcg total) by mouth daily. 30 tablet 11  . loratadine (CLARITIN) 10 MG tablet Take 10 mg by mouth daily.    . medroxyPROGESTERone (DEPO-PROVERA) 150 MG/ML injection Inject 150 mg into the muscle every 3 (three) months.    . metoprolol tartrate (LOPRESSOR) 25 MG tablet Take 25 mg by mouth 2 (two)  times daily.    [name] nystatin (MYCOSTATIN/NYSTOP) powder Apply 1 application topically 3 (three) times daily. 15 g 0  . pantoprazole (PROTONIX) 40 MG tablet Take 40 mg by mouth daily as needed (heartburn).    . Vitamin D3 (VITAMIN D) 25 MCG tablet Take 1 tablet (1,000 Units total) by mouth daily. 60 tablet 0   No current facility-administered medications on file prior to visit.    ALLERGIES: Allergies  Allergen Reactions  . Amoxicillin-Pot Clavulanate Diarrhea and Nausea And Vomiting    Did it involve swelling of the face/tongue/throat, SOB, or low BP? No Did it involve sudden or severe rash/hives, skin peeling, or any reaction on the inside of your mouth or nose? No Did you need to seek medical attention at a hospital or doctor's office? No When did it last happen?Few years ago If all above answers are "NO", may proceed with cephalosporin use.  . Fish Allergy Diarrhea  . Lactose Intolerance  (Gi) Diarrhea  . Shellfish-Derived Products Diarrhea and Nausea And Vomiting  . Tape Itching    Medical tape causes itching  . Lactase Nausea And Vomiting  . Latex Rash    FAMILY HISTORY: Family History  Problem Relation Age of Onset  . Hypertension Maternal Grandmother   . Anxiety disorder Maternal Grandmother   . ADD / ADHD Maternal Grandmother   . Diabetes Maternal Grandmother   . Hyperlipidemia Maternal Grandfather   . Hypothyroidism Maternal Grandfather   . Cancer Maternal Grandfather        blood cancer (unknown name)  . Hyperlipidemia Mother   . Anxiety disorder Mother   . Hyperlipidemia Other   . Migraines Neg Hx   . Seizures Neg Hx   . Depression Neg Hx   . Autism Neg Hx   . Bipolar disorder Neg Hx   . Schizophrenia Neg Hx     Objective:  Blood pressure 125/88, pulse 88, resp. rate 18, height 5' (1.524 m), weight 159 lb (72.1 kg), SpO2 100 %. General: No acute distress.  Patient appears well-groomed.   Head:  Normocephalic/atraumatic Eyes:  fundi examined but not visualized Neck: supple, no paraspinal tenderness, full range of motion Back: No paraspinal tenderness Heart: regular rate and rhythm Lungs: Clear to auscultation bilaterally. Vascular: No carotid bruits. Neurological Exam: Mental status: alert and oriented to person, place, and time, recent and remote memory intact, fund of knowledge intact, attention and concentration intact, speech fluent and not dysarthric, language intact. Cranial nerves: CN I: not tested CN II: pupils equal, round and reactive to light, visual fields intact CN III, IV, VI:  full range of motion, no nystagmus, no ptosis CN V: facial sensation intact. CN VII: upper and lower face symmetric CN VIII: hearing intact CN IX, X: gag intact, uvula midline CN XI: sternocleidomastoid and trapezius muscles intact CN XII: tongue midline Bulk & Tone: normal, no fasciculations. Motor:  muscle strength 5/5 throughout Sensation:  Pinprick,  temperature and vibratory sensation intact. Deep Tendon Reflexes:  2+ throughout,  toes downgoing.   Finger to nose testing:  Without dysmetria.   Heel to shin:  Without dysmetria.   Gait:  Normal station and stride.  Romberg negative.    Thank you for allowing me to take part in the care of this patient.  Metta Clines, DO  CC: Aquilla Hacker, MD

## 2020-06-13 ENCOUNTER — Other Ambulatory Visit: Payer: Self-pay

## 2020-06-13 ENCOUNTER — Encounter: Payer: Self-pay | Admitting: Neurology

## 2020-06-13 ENCOUNTER — Ambulatory Visit: Payer: Medicaid Other | Admitting: Neurology

## 2020-06-13 VITALS — BP 125/88 | HR 88 | Resp 18 | Ht 60.0 in | Wt 159.0 lb

## 2020-06-13 DIAGNOSIS — E1065 Type 1 diabetes mellitus with hyperglycemia: Secondary | ICD-10-CM | POA: Diagnosis not present

## 2020-06-13 DIAGNOSIS — IMO0002 Reserved for concepts with insufficient information to code with codable children: Secondary | ICD-10-CM

## 2020-06-13 DIAGNOSIS — G43019 Migraine without aura, intractable, without status migrainosus: Secondary | ICD-10-CM | POA: Diagnosis not present

## 2020-06-13 DIAGNOSIS — E1040 Type 1 diabetes mellitus with diabetic neuropathy, unspecified: Secondary | ICD-10-CM | POA: Diagnosis not present

## 2020-06-13 DIAGNOSIS — R413 Other amnesia: Secondary | ICD-10-CM | POA: Diagnosis not present

## 2020-06-13 MED ORDER — PROMETHAZINE HCL 12.5 MG PO TABS: 12.5000 mg | ORAL_TABLET | Freq: Four times a day (QID) | ORAL | 5 refills | Status: DC | PRN

## 2020-06-13 MED ORDER — RIZATRIPTAN BENZOATE 10 MG PO TBDP: 10.0000 mg | ORAL_TABLET | ORAL | 5 refills | Status: DC | PRN

## 2020-06-13 MED ORDER — QULIPTA 60 MG PO TABS: 60.0000 mg | ORAL_TABLET | Freq: Every day | ORAL | 5 refills | Status: DC

## 2020-06-13 NOTE — Patient Instructions (Signed)
  1. Will order neurocognitive testing 2. Start Qulipta 60mg  daily.  3. Take rizatriptan 10mg  at earliest onset of headache.  May repeat dose once in 2 hours if needed.  Maximum 2 tablets in 24 hours.  Take Phenergan for nausea. 4. Limit use of pain relievers to no more than 2 days out of the week.  These medications include acetaminophen, NSAIDs (ibuprofen/Advil/Motrin, naproxen/Aleve, triptans (Imitrex/sumatriptan), Excedrin, and narcotics.  This will help reduce risk of rebound headaches. 5. Be aware of common food triggers:  - Caffeine:  coffee, black tea, cola, Mt. Dew  - Chocolate  - Dairy:  aged cheeses (brie, blue, cheddar, gouda, [place], provolone, [place], Swiss, etc), chocolate milk, buttermilk, sour cream, limit eggs and yogurt  - Nuts, peanut butter  - Alcohol  - Cereals/grains:  FRESH breads (fresh bagels, sourdough, doughnuts), yeast productions  - Processed/canned/aged/cured meats (pre-packaged deli meats, hotdogs)  - MSG/glutamate:  soy sauce, flavor enhancer, pickled/preserved/marinated foods  - Sweeteners:  aspartame (Equal, Nutrasweet).  Sugar and Splenda are okay  - Vegetables:  legumes (lima beans, lentils, snow peas, fava beans, pinto peans, peas, garbanzo beans), sauerkraut, onions, olives, pickles  - Fruit:  avocados, bananas, citrus fruit (orange, lemon, grapefruit), mango  - Other:  Frozen meals, macaroni and cheese 6. Routine exercise 7. Stay adequately hydrated (aim for 64 oz water daily) 8. Keep headache diary 9. Maintain proper stress management 10. Maintain proper sleep hygiene 11. Do not skip meals 12. Consider supplements:  magnesium citrate 400mg  daily, riboflavin 400mg  daily, coenzyme Q10 100mg  three times daily.

## 2020-06-18 ENCOUNTER — Encounter (INDEPENDENT_AMBULATORY_CARE_PROVIDER_SITE_OTHER): Payer: Self-pay

## 2020-06-18 LAB — COMPREHENSIVE METABOLIC PANEL
AG Ratio: 1.6 (calc) (ref 1.0–2.5)
ALT: 12 U/L (ref 6–29)
AST: 13 U/L (ref 10–30)
Albumin: 4.4 g/dL (ref 3.6–5.1)
Alkaline phosphatase (APISO): 66 U/L (ref 31–125)
BUN: 12 mg/dL (ref 7–25)
CO2: 25 mmol/L (ref 20–32)
Calcium: 8.8 mg/dL (ref 8.6–10.2)
Chloride: 102 mmol/L (ref 98–110)
Creat: 0.84 mg/dL (ref 0.50–1.10)
Globulin: 2.8 g/dL (calc) (ref 1.9–3.7)
Glucose, Bld: 149 mg/dL — ABNORMAL HIGH (ref 65–139)
Potassium: 3.8 mmol/L (ref 3.5–5.3)
Sodium: 139 mmol/L (ref 135–146)
Total Bilirubin: 0.3 mg/dL (ref 0.2–1.2)
Total Protein: 7.2 g/dL (ref 6.1–8.1)

## 2020-06-18 LAB — VITAMIN D 25 HYDROXY (VIT D DEFICIENCY, FRACTURES): Vit D, 25-Hydroxy: 31 ng/mL (ref 30–100)

## 2020-06-18 LAB — PHOSPHORUS: Phosphorus: 5.9 mg/dL — ABNORMAL HIGH (ref 2.7–5.0)

## 2020-06-18 LAB — MAGNESIUM: Magnesium: 1.9 mg/dL (ref 1.5–2.5)

## 2020-06-18 LAB — TSH: TSH: 12.11 mIU/L — ABNORMAL HIGH

## 2020-06-18 LAB — T4, FREE: Free T4: 1.6 ng/dL — ABNORMAL HIGH (ref 0.8–1.4)

## 2020-06-24 ENCOUNTER — Telehealth (INDEPENDENT_AMBULATORY_CARE_PROVIDER_SITE_OTHER): Payer: Self-pay | Admitting: Pediatric Endocrinology

## 2020-06-24 ENCOUNTER — Other Ambulatory Visit: Payer: Self-pay | Admitting: Obstetrics & Gynecology

## 2020-06-24 ENCOUNTER — Encounter: Payer: Self-pay | Admitting: Obstetrics & Gynecology

## 2020-06-24 ENCOUNTER — Other Ambulatory Visit: Payer: Self-pay

## 2020-06-24 ENCOUNTER — Ambulatory Visit (INDEPENDENT_AMBULATORY_CARE_PROVIDER_SITE_OTHER): Payer: Medicaid Other | Admitting: Obstetrics & Gynecology

## 2020-06-24 VITALS — BP 131/82 | HR 104 | Ht 60.0 in | Wt 157.0 lb

## 2020-06-24 DIAGNOSIS — E892 Postprocedural hypoparathyroidism: Secondary | ICD-10-CM

## 2020-06-24 DIAGNOSIS — N921 Excessive and frequent menstruation with irregular cycle: Secondary | ICD-10-CM | POA: Diagnosis not present

## 2020-06-24 DIAGNOSIS — R102 Pelvic and perineal pain: Secondary | ICD-10-CM

## 2020-06-24 DIAGNOSIS — N83202 Unspecified ovarian cyst, left side: Secondary | ICD-10-CM

## 2020-06-24 DIAGNOSIS — E108 Type 1 diabetes mellitus with unspecified complications: Secondary | ICD-10-CM

## 2020-06-24 MED ORDER — PRENATAL VITAMINS 28-0.8 MG PO TABS: 1.0000 | ORAL_TABLET | Freq: Every day | ORAL | 12 refills | Status: DC

## 2020-06-24 NOTE — Progress Notes (Signed)
Subjective:    Patient ID: Cassandra [name], female    DOB: 30-Nov-2000, 20 y.o.   MRN: [id number]  HPI  20 year old female presents with her significant other wanting to become pregnant.  Patient has a past medical history significant for type 1 diabetes with complication as well as Hashimoto's thyroid [place] and postsurgical hypoparathyroidism.  Patient states her last hemoglobin A1c was around 7.  She is followed by peds endocrinology and will be transferred furring to an adult endocrinology practice soon.  Patient was recently admitted for hypocalcemia secondary to the hypoparathyroidism.  Patient is also being followed by her endocrinologist for this issue.  Her TSH was still slightly elevated around 5.  Patient is worried that she has endometriosis.  She has pelvic pain mostly on the left.  She had an ultrasound as outlined below.  It does appear that she has a dominant follicle on the left as well as a possible endometrioma versus hemorrhagic cyst.  Her uterus is normal with a thin lining.  Patient last Depo shot was December 2021 and she had a month long menses in April.  She has not had another menstrual cycle or bleeding since then.  She is not currently on prenatal vitamins.  Review of Systems  Constitutional: Negative.   Respiratory: Negative.   Cardiovascular: Negative.   Gastrointestinal: Negative.   Genitourinary: Positive for pelvic pain.  Psychiatric/Behavioral: Negative.        Objective:   Physical Exam Vitals reviewed.  Constitutional:      General: She is not in acute distress.    Appearance: She is well-developed.  HENT:     Head: Normocephalic and atraumatic.  Eyes:     Conjunctiva/sclera: Conjunctivae normal.  Cardiovascular:     Rate and Rhythm: Normal rate.  Pulmonary:     Effort: Pulmonary effort is normal.  Skin:    General: Skin is warm and dry.  Neurological:     Mental Status: She is alert and oriented to person, place, and time.    Vitals:    06/24/20 1427  BP: 131/82  Pulse: (!) 104  Weight: 157 lb (71.2 kg)  Height: 5' (1.524 m)      Assessment & Plan:  20 year old female desiring pregnancy and pelvic pain.  1.  We discussed very tight control of her diabetes during conception, organogenesis, and rest of pregnancy.  She is on a closed-loop pump which has helped her glycemic control.  In the meantime the patient will talk to her endocrinologist about increasing her thyroid hormone to get her TSH in a normal level.  I  Referred Breklyn to MFM for preconceptual counseling regarding hypoparathyroidism.  They will also probably give recommendations on her diabetes and thyroid disease. 2.  Patient is to get a free menstrual and conception at.  She is to track her cycles so we can see if they are regular off the Depo-Provera. 3.  Patient to start prenatal vitamins to increase folic acid stores 4.  We will repeat complete pelvic ultrasound with transvaginal component at the end of June.  Patient request this to be in [place].  We will be looking at her left adnexa compared to her prior ultrasound in April to see if there is been resolution of the left ovarian cysts. 5.  Patient advised to not get pregnant until these activities are complete.  We want her to start her pregnancy optimized.  Patient has an allergy to latex so may use abstinence or lambskin condoms.  She declines any other type of contraception today. 6.  RTC after f/u US in late June.   45 minutes spent with patient during exam, counseling, review of records, and documentation.

## 2020-06-24 NOTE — Telephone Encounter (Signed)
  Who's calling (name and relationship to patient) : [facility]  Best contact number:[phone]  Provider they see:[email or web address]  Reason for call: A request for recent office note and standard written order form was faxed over on 5/16. NEED DOCUMENTS ASAP     PRESCRIPTION REFILL ONLY  Name of prescription:  Pharmacy:

## 2020-06-25 NOTE — Telephone Encounter (Signed)
Contacted [facility] and let them know we have not received any documents or request for Cassandra [name]. They inform they refaxed this morning to our fax [phone]. Informed them that is our phone number. Gave them the correct fax number

## 2020-07-04 ENCOUNTER — Other Ambulatory Visit: Payer: Self-pay | Admitting: Pediatrics

## 2020-07-04 DIAGNOSIS — G479 Sleep disorder, unspecified: Secondary | ICD-10-CM

## 2020-07-09 ENCOUNTER — Other Ambulatory Visit: Payer: Self-pay | Admitting: Pediatrics

## 2020-07-09 MED ORDER — KETOCONAZOLE 2 % EX CREA: 1.0000 "application " | TOPICAL_CREAM | Freq: Every day | CUTANEOUS | 1 refills | Status: DC

## 2020-07-10 ENCOUNTER — Other Ambulatory Visit: Payer: Self-pay | Admitting: Pediatrics

## 2020-07-10 MED ORDER — NYSTATIN 100000 UNIT/GM EX POWD: 1.0000 "application " | Freq: Three times a day (TID) | CUTANEOUS | 3 refills | Status: DC

## 2020-07-23 ENCOUNTER — Ambulatory Visit: Payer: Medicaid Other | Admitting: Psychology

## 2020-07-23 ENCOUNTER — Encounter: Payer: Self-pay | Admitting: Psychology

## 2020-07-23 ENCOUNTER — Other Ambulatory Visit: Payer: Self-pay

## 2020-07-23 ENCOUNTER — Encounter (INDEPENDENT_AMBULATORY_CARE_PROVIDER_SITE_OTHER): Payer: Self-pay

## 2020-07-23 DIAGNOSIS — R4189 Other symptoms and signs involving cognitive functions and awareness: Secondary | ICD-10-CM

## 2020-07-23 DIAGNOSIS — F411 Generalized anxiety disorder: Secondary | ICD-10-CM

## 2020-07-23 DIAGNOSIS — I499 Cardiac arrhythmia, unspecified: Secondary | ICD-10-CM | POA: Insufficient documentation

## 2020-07-23 DIAGNOSIS — E1065 Type 1 diabetes mellitus with hyperglycemia: Secondary | ICD-10-CM

## 2020-07-23 DIAGNOSIS — IMO0002 Reserved for concepts with insufficient information to code with codable children: Secondary | ICD-10-CM

## 2020-07-23 DIAGNOSIS — E1040 Type 1 diabetes mellitus with diabetic neuropathy, unspecified: Secondary | ICD-10-CM

## 2020-07-23 NOTE — Progress Notes (Signed)
   Psychometrician Note   Cognitive testing was administered to Cassandra [name] by Wallace Keller, B.S. (psychometrist) under the supervision of Dr. Newman Nickels, Ph.D., licensed psychologist on 07/23/20. Cassandra [name] did not appear overtly distressed by the testing session per behavioral observation or responses across self-report questionnaires. Rest breaks were offered.    The battery of tests administered was selected by Dr. Newman Nickels, Ph.D. with consideration to Cassandra [name]'s current level of functioning, the nature of her symptoms, emotional and behavioral responses during interview, level of literacy, observed level of motivation/effort, and the nature of the referral question. This battery was communicated to the psychometrist. Communication between Dr. Newman Nickels, Ph.D. and the psychometrist was ongoing throughout the evaluation and Dr. Newman Nickels, Ph.D. was immediately accessible at all times. Dr. Newman Nickels, Ph.D. provided supervision to the psychometrist on the date of this service to the extent necessary to assure the quality of all services provided.    Cassandra [name] will return within approximately 1-2 weeks for an interactive feedback session with Dr. Milbert Coulter at which time her test performances, clinical impressions, and treatment recommendations will be reviewed in detail. Cassandra [name] understands she can contact our office should she require our assistance before this time.  A total of 185 minutes of billable time were spent face-to-face with Cassandra [name] by the psychometrist. This includes both test administration and scoring time. Billing for these services is reflected in the clinical report generated by Dr. Newman Nickels, Ph.D.  This note reflects time spent with the psychometrician and does not include test scores or any clinical interpretations made by Dr. Milbert Coulter. The full report will follow in a separate note.

## 2020-07-23 NOTE — Progress Notes (Addendum)
NEUROPSYCHOLOGICAL EVALUATION [place]. [place] Department of Neurology  Date of Evaluation: July 23, 2020  Reason for Referral:   Cassandra [name] is a 20 y.o. right-handed Caucasian female referred by Cassandra [name], D.O., to characterize her current cognitive functioning and assist with diagnostic clarity and treatment planning in the context of subjective cognitive decline and difficult to manage type I diabetes.   Assessment and Plan:   Clinical Impression(s): Scores across stand-alone and embedded performance validity measures were variable. She also endorsed infrequent symptoms on a comprehensive personality assessment beyond what is typically seen in a clinical population, rendering the results of this measure questionable. As such, mild caution should be taken when interpreting results at face value as lower scores may underestimate true abilities to an unknown extent.  If taken at face value, Cassandra [name]' pattern of performance is suggestive of a primary weakness surrounding verbal fluency, as well as generally mild performance variability surrounding processing speed, executive functioning, and retrieval aspects of memory. Performance was appropriate across attention/concentration, receptive language, confrontation naming, visuospatial abilities, encoding (i.e., learning) aspects of memory, and consolidation/recognition aspects of memory. Cassandra [name] generally denied difficulties completing instrumental activities of daily living (ADLs) independently.  At present, the most likely culprit for ongoing mild cognitive dysfunction is related to her history of very difficult to manage type I diabetes. Meta-analyses examining cognitive effects of this condition in children and throughout early development highlight common areas of dysfunction surrounding verbal fluency and learning/memory. In meta-analyses utilizing a much broader age-grouping of adults, there is also  report of mild difficulties surrounding processing speed and executive functioning, namely cognitive flexibility. Cassandra [name]' pattern of performance across testing falls in line with empirical research on the matter. It should be noted that generally speaking, increased severity of abnormal glycemic events is associated with greater degrees of cognitive complaints. In addition to this, her history of Hashimoto's thyroiditis, mild psychiatric distress, and sleep dysfunction could certainly worsen the effects of her medical condition. However, it is worth noting that during interview and on related questionnaires, she did not report significant symptoms occurring within the past 1-2 weeks across the latter conditions. More specific to memory, despite some difficulty recalling a list of words on demand, overall performance does not suggest significant memory impairment. However, it should be pointed out that even mild dysfunction can hamper performance of everyday tasks, especially when they are performed in demanding environments. Continued medical monitoring will be important moving forward.   Recommendations: The importance of appropriate and consistent management of her type I diabetes cannot be understated. While this condition can be difficult to control at times, there is mention in medical records of some concerns regarding prior medication compliance. During interview, Cassandra [name] further noted that she may forget to take her medications from time to time. I would strongly encourage Cassandra [name] to adhere to any and all recommendations made by her medical doctors to appropriately treat this condition. I would also strongly encourage her to develop a successful strategy (e.g., use of pillbox, phone alarms, external reminders) so that she has 100% medication compliance.   Cassandra [name] reported current medication intervention for mild psychiatric distress which she described as effective. She is encouraged to  maintain this treatment. She could also consider engaging in short-term psychotherapy to address symptoms of psychiatric distress should she feel it necessary and/or beneficial.  Cassandra [name] is encouraged to attend to lifestyle factors for brain health (e.g., regular physical exercise, good nutrition habits,  regular participation in cognitively-stimulating activities, and general stress management techniques), which are likely to have benefits for both emotional adjustment and cognition. Optimal control of vascular risk factors (including safe cardiovascular exercise and adherence to dietary recommendations) is encouraged.   When learning new information, she would benefit from information being broken up into small, manageable pieces. She may also find it helpful to articulate the material in her own words and in a context to promote encoding at the onset of a new task. This material may need to be repeated multiple times to promote encoding.  Memory can be improved using internal strategies such as rehearsal, repetition, chunking, mnemonics, association, and imagery. External strategies such as written notes in a consistently used memory journal, visual and nonverbal auditory cues such as a calendar on the refrigerator or appointments with alarm, such as on a cell phone, can also help maximize recall.    To address problems with processing speed, she may wish to consider:   -Ensuring that she is alerted when essential material or instructions are being presented   -Allowing additional processing time or a chance to rehearse novel information   -Allowing for more time in comprehending, processing, and responding in conversation  To address problems with fluctuating attention, she may wish to consider:   -Avoiding external distractions when needing to concentrate   -Limiting exposure to fast paced environments with multiple sensory demands   -Writing down complicated information and using checklists    -Attempting and completing one task at a time (i.e., no multi-tasking)   -Verbalizing aloud each step of a task to maintain focus   -Reducing the amount of information considered at one time  Reducing anxiety may also aid in the retrieval of information.  Review of Records:   Ms. Cassell was seen in the ED on 08/03/2011 with a 2 week history of polydipsia, polyuria, polyphagia, and hyperglycemia. She was ultimately diagnosed with type 1 diabetes melitis and admitted to the hospital for glucose correction. She had a glucose of 384 in the ED, a bicarb of 22, and was not in DKA. She was started on basal insulin 12u lantus nightly and carb correction of 1:15g with SSI 1 unit for every 70 greater than 200. Her BG ranged from 194-290 throughout her stay and was discharged home on this regimen after adequate education.   Ms. Deshpande was seen in the ED on 07/23/2015 after presenting with nausea, headache, and lightheadedness in the setting of known type I diabetes and laboratory values consistent with DKA. It was noted that this represented her 9th ED admission in the past 16 months for similar complaints. In the ED, Ms. Osgood was afebrile, HR 108-117, RR 20-22, BP 134/76, saturating 99% on RA. Lab work revealed U/A (>1000 glucose, >80 ketones), BMP (Na 128, K 4.2, Cl 97, CO2 12, glucose 488), VBG (7.25/26/57/11.4 +13.9 base). She was given 1x NS bolus and Zofran, and she was started on a 2 bag insulin system with insulin running at 0.1U/kg/hr. She was started on the 2-bag system of normal saline w/ K acetate and D10 normal saline with K acetate titrated based on hourly blood sugar levels and continuous insulin of 0.06U/hr. Blood glucose was check hourly and BMP was checked q4h until her anion gap closed and her CO2 was 20. She received her nightly 40U of Lantus prior to transitioning the next morning. Upon transitioning, the infusions were stopped and she was given 5.5U of insulin for her 17g of carbs with breakfast.  She was continued on her home regimen of Lantus 40 nightly, Novalog 1:3 CHO and 1U for every 30>120 correction factor. Blood sugars once transitioned were controlled and <200, with the exception of one 248 at bedtime the night prior to discharged. She was discharged on her home insulin regimen with a future endocrinology appointment.  Her most recent admission was on 04/19/2020. Ms. Myhand underwent a total thyroidectomy for enlarged thyroid gland in the context of Hashimoto's thyroiditis in December of 2020. Since then, she has required frequent changes to her calcium supplementation due to persistent hypocalcemia. Prior to admission, she was taking nine 547m calcium-vitamin D pills a day. Her most recent lab evaluation on 3/16 revealed a calcium of 8.2 (decreased from 8.6 two days prior). She developed symptoms including body spasms and cramping, eye twitching, loss of appetite, and diarrhea for approximately 1 month. She also endorsed orthostasis for 2 weeks. She denied abdominal pain, fever, chest pain or shortness of breath. She has not had any changes in meds or diet during this time. She has been having wide ranged glucoses from 50-400 with most being approximately 200. She was continued on her home insulin pump during admission. Glucose levels ranged from 133-346 with the majority of readings in the 130-180 range. She was discharged home on 04/21/2020.  [facility] (Cassandra [name] D.O.) on 06/13/2020 for an evaluation of ongoing migraine headaches and neuropathy. She reported experiencing migraine headaches since 89to 20years old. These are typically right temporal sharp pain causing bilateral jaw pain. There is associated nausea, photophobia, and blurred vision, but no vomiting, phonophobia, numbness, or weakness. Migraines are usually moderate, sometimes severe, and can last 2-3 days. They were said to occur 2-3 times a month. She has diabetic polyneuropathy with difficult to control  type 1 diabetes. Her most recent A1c from April was 7.2. She also has Hashimoto's thyroiditis with acquired hypothyroidism for which she takes levothyroxine. TSH from April was 12.33 with free T4 of 1.2. In addition to these concerns, Ms. AAbplanalpreported ongoing short-term memory dysfunction. Examples included forgetting things from 10 minutes prior and needing to do things multiple times before she remembers how to do it. Ultimately, Ms. ADubielwas referred for a comprehensive neuropsychological evaluation to characterize her cognitive abilities and to assist with diagnostic clarity and treatment planning.   No neuroimaging was available for review.   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/25/2019   S/P thyroidectomy 01/27/2019   Symptomatic mammary hypertrophy 03/17/2017   Thyroid goiter 05/09/2018   Type 1 diabetes, uncontrolled, 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    Past Surgical History:  Procedure Laterality Date   ADENOIDECTOMY     COLONOSCOPY  ESOPHAGOGASTRODUODENOSCOPY     THYROIDECTOMY N/A 01/25/2019   Procedure: COMPLETE THYROIDECTOMY;  Surgeon: Leta Baptist, MD;  Location: [facility] OR;  Service: ENT;  Laterality: N/A;   TOE SURGERY Bilateral     for ingrown toenails   TYMPANOSTOMY TUBE PLACEMENT     WISDOM TOOTH EXTRACTION      Current Outpatient Medications:    amitriptyline (ELAVIL) 75 MG tablet, Take 1 tablet (75 mg total) by mouth at bedtime., Disp: 90 tablet, Rfl: 1   Atogepant (QULIPTA) 60 MG TABS, Take 60 mg by mouth daily., Disp: 30 tablet, Rfl: 5   Blood Glucose Monitoring Suppl (ACCU-CHEK GUIDE) w/Device KIT, 1 kit by Does not apply route daily as needed., Disp: 2 kit, Rfl: 5   calcitRIOL (ROCALTROL) 0.25 MCG capsule, Take 3 capsules (0.75 mcg total) by mouth in the morning and at bedtime., Disp: 270 capsule, Rfl: 5   calcium-vitamin D (OSCAL WITH D) 500-200 MG-UNIT tablet, Take 4 tablets by mouth 3 (three) times daily., Disp: 360 tablet, Rfl: 11   Continuous Blood Gluc Receiver (DEXCOM G6 RECEIVER) DEVI, 1 Device by Does not apply route daily as needed., Disp: 1 each, Rfl: 0   Continuous Blood Gluc Sensor (DEXCOM G6 SENSOR) MISC, 3 kits by Does not apply route daily as needed (Change sensor every 10 days)., Disp: 3 each, Rfl: 5   Continuous Blood Gluc Transmit (DEXCOM G6 TRANSMITTER) MISC, 1 kit by Does not apply route daily as needed., Disp: 1 each, Rfl: 1   DULoxetine (CYMBALTA) 20 MG capsule, TAKE 1 CAPSULE BY MOUTH EVERY DAY (Patient taking differently: Take 20 mg by mouth daily.), Disp: 90 capsule, Rfl: 1   fluticasone (FLONASE) 50 MCG/ACT nasal spray, Place 1 spray into both nostrils 2 (two) times daily as needed for allergies or rhinitis., Disp: , Rfl:    Glucagon (BAQSIMI TWO PACK) 3 MG/DOSE POWD, Place 3 mg into the nose once as needed for up to 1 dose (for severe hypoglycemia when patient is unconcious)., Disp: 2 each, Rfl: 3   hydrochlorothiazide (HYDRODIURIL) 50 MG tablet, Take 1 tablet (50 mg total) by mouth 2 (two) times daily., Disp: 60 tablet, Rfl: 3   insulin lispro (HUMALOG) 100 UNIT/ML injection, UP TO 300 UNITS IN INSULIN PUMP EVERY 48 HOURS, PER DKA AND HYPERGLYCEMIA PROTOCOLS (Patient taking differently:  Inject 125-130 Units into the skin See admin instructions. UP TO 300 UNITS IN INSULIN PUMP EVERY 48 HOURS, PER DKA AND HYPERGLYCEMIA PROTOCOLS), Disp: 40 mL, Rfl: 5   Insulin Syringe-Needle U-100 (INSULIN SYRINGE .3CC/29GX1/2") 29G X 1/2" 0.3 ML MISC, 1 each by Does not apply route 6 (six) times daily., Disp: 200 each, Rfl: 6   ketoconazole (NIZORAL) 2 % cream, Apply 1 application topically daily., Disp: 60 g, Rfl: 1   levocetirizine (XYZAL) 5 MG tablet, Take 5 mg by mouth every evening., Disp: , Rfl:    levothyroxine (SYNTHROID) 150 MCG tablet, Take 1 tablet (150 mcg total) by mouth daily., Disp: 30 tablet, Rfl: 11   loratadine (CLARITIN) 10 MG tablet, Take 10 mg by mouth daily., Disp: , Rfl:    medroxyPROGESTERone (DEPO-PROVERA) 150 MG/ML injection, Inject 150 mg into the muscle every 3 (three) months., Disp: , Rfl:    metoprolol tartrate (LOPRESSOR) 25 MG tablet, Take 25 mg by mouth 2 (two) times daily., Disp: , Rfl:    nystatin (MYCOSTATIN/NYSTOP) powder, Apply 1 application topically 3 (three) times daily., Disp: 90 g, Rfl: 3   pantoprazole (PROTONIX) 40 MG tablet, Take 40 mg  by mouth daily as needed (heartburn)., Disp: , Rfl:    Prenatal Vit-Fe Fumarate-FA (PRENATAL VITAMINS) 28-0.8 MG TABS, Take 1 tablet by mouth daily., Disp: 30 tablet, Rfl: 12   promethazine (PHENERGAN) 12.5 MG tablet, Take 1 tablet (12.5 mg total) by mouth every 6 (six) hours as needed for nausea or vomiting., Disp: 30 tablet, Rfl: 5   rizatriptan (MAXALT-MLT) 10 MG disintegrating tablet, Take 1 tablet (10 mg total) by mouth as needed for migraine (may repeat in 2 hours.  maximum 2 tablets in 24 hours.). May repeat in 2 hours if needed, Disp: 9 tablet, Rfl: 5   Vitamin D3 (VITAMIN D) 25 MCG tablet, Take 1 tablet (1,000 Units total) by mouth daily., Disp: 60 tablet, Rfl: 0  Clinical Interview:   The following information was obtained during a clinical interview with Ms. Rumore and her boyfriend prior to cognitive  testing.  Cognitive Symptoms: Decreased short-term memory: Endorsed. Difficulties were largely generalized in nature. She reported a primary difficulty recalling the details of previous conversations. She also noted occasional issues remembering the names of semi-familiar individuals, as well as misplacing things around her residence. She further described it taking longer for certain actions to become an easily remembered routine.  Decreased long-term memory: Denied. Decreased attention/concentration: Denied. Reduced processing speed: Endorsed. Difficulties with executive functions: Denied. She and her boyfriend also generally denied significant impulsive behaviors or recent personality changes.  Difficulties with emotion regulation: Denied. Difficulties with receptive language: Denied. Difficulties with word finding: Denied. Decreased visuoperceptual ability: Denied.  Trajectory of deficits: Ms. Treto reported experiencing an abnormal glycemic event which resulted in her falling into a prolonged coma approximately four years ago (I was unable to locate these records specifically, but there are many ED admissions related to type I diabetes management and concerns surrounding DKA since her original diagnosis in 2013). Since this reported event four years ago, short-term memory concerns have become notable. They were described to be more or less stable without a progressively worsening component.   Difficulties completing ADLs: Denied outside of occasionally having trouble remembering to take her medications as prescribed. She did not report any significant issues with financial management/bill paying or driving.   Additional Medical History: History of traumatic brain injury/concussion: Denied. History of stroke: Denied. History of seizure activity: Endorsed. Around the time of her reported diabetic-related coma four years prior, she did report being told that she experienced diabetic seizure  activity. No seizure activity was said to be present since that time.  History of known exposure to toxins: Denied. Symptoms of chronic pain: She acknowledged diabetic neuropathy, particularly in her fingers/hands. However, symptoms are generally manageable.  Experience of frequent headaches/migraines: Denied. Medical records do suggest a history of semi-frequent migraine headaches ongoing for the past 6-7 years. However, she reported recently being started on headache-related medications and that these have been helpful at decreasing the frequency of these experiences.  Frequent instances of dizziness/vertigo: Endorsed. These were said to occur each time she bends down and stands back up abruptly.   Sensory changes: Denied. Balance/coordination difficulties: Denied outside of a perhaps longstanding history of being somewhat clumsy. No recent falls were reported.  Other motor difficulties: Denied.  Sleep History: Estimated hours obtained each night: 7-8 hours.  Difficulties falling asleep: Endorsed. She reported symptoms of insomnia related to mild psychiatric distress (e.g., racing thoughts, having an overactive mind).  Difficulties staying asleep: Denied. Feels rested and refreshed upon awakening: Denied. She reported frequently waking up feeling tired/fatigued.  History of snoring: Endorsed. History of waking up gasping for air: Denied. Witnessed breath cessation while asleep: Denied.  History of vivid dreaming: Denied. Excessive movement while asleep: Denied. Instances of acting out her dreams: Endorsed. However, these were said to occur very infrequently and may be related to medication side effects as instances are noted when she takes certain medications closer to the time where she goes to sleep.   Psychiatric/Behavioral Health History: Depression: She acknowledged a longstanding history of generally mild depressive symptoms. She described her current mood as "good" and noted that  medication intervention has been helpful in improving mood symptoms. Current or remote suicidal ideation, intent, or plan was denied.  Anxiety: She acknowledged a longstanding history of generalized anxiety symptoms which impact her ability to fall asleep quickly. She did report that current medication interventions were helpful at improving these symptoms.  Mania: Denied. Trauma History: Denied. Visual/auditory hallucinations: Denied. Delusional thoughts: Denied.  Tobacco: Denied. Records suggest that she stopped cigarette use in 2020.  Alcohol: She denied current alcohol use as well as a history of problematic alcohol abuse or dependence.  Recreational drugs: Denied.  Family History: Problem Relation Age of Onset   Hypertension Maternal Grandmother    Anxiety disorder Maternal Grandmother    ADD / ADHD Maternal Grandmother    Diabetes Maternal Grandmother    Hyperlipidemia Maternal Grandfather    Hypothyroidism Maternal Grandfather    Cancer Maternal Grandfather        blood cancer (unknown name)   Hyperlipidemia Mother    Anxiety disorder Mother    Hyperlipidemia Other    Migraines Neg Hx    Seizures Neg Hx    Depression Neg Hx    Autism Neg Hx    Bipolar disorder Neg Hx    Schizophrenia Neg Hx    This information was confirmed by Ms. Davia.  Academic/Vocational History: Highest level of educational attainment: 12 years. She graduated from high school. 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 However, following her reported glycemic event with associated coma approximately four years prior, she noted a decline in academic performance (C/D student) attributed to memory concerns.  History of developmental delay: Denied. History of grade repetition: Denied. Enrollment in special education courses: Denied. History of LD/ADHD: Denied.  Employment: She currently works for a local [employer]. She noted that memory concerns have made it  difficult for her to learn new routines or tasks quickly.   Evaluation Results:   Behavioral Observations: Ms. Andreoni was accompanied by her boyfriend, arrived to her appointment on time, and was appropriately dressed and groomed. She appeared alert and oriented. Observed gait and station were within normal limits. Gross motor functioning appeared intact upon informal observation and no abnormal movements (e.g., tremors) were noted. Her affect was relaxed and positive during interview. Thought processes were coherent, organized, and normal in content. Insight into her cognitive difficulties appeared adequate. During testing, sustained attention was appropriate. Task engagement was adequate and she persisted when challenged. Overall, Ms. Hams was cooperative with the clinical interview and subsequent testing procedures.   Adequacy of Effort: The validity of neuropsychological testing is limited by the extent to which the individual being tested may be assumed to have exerted adequate effort during testing. Ms. Arno expressed her intention to perform to the best of her abilities and exhibited adequate task engagement and persistence. Scores across stand-alone and embedded performance validity measures were variable. She also endorsed infrequent symptoms on a comprehensive personality  assessment beyond what is typically seen in a clinical population, rendering the results of this measure questionable. As such, mild caution should be taken when interpreting these results at face value as lower scores may underestimate true abilities to an unknown extent.  Test Results: Ms. Aziz was fully oriented at the time of the current evaluation.  Intellectual abilities based upon educational and vocational attainment were estimated to be in the below average to average range. Across a standardized intellectual assessment, Ms. Pawelski' full scale IQ was in the below average range (FSIQ: 82, 12th percentile).     Processing speed was mildly variable, ranging from the well below average to average normative ranges. Basic attention was average. More complex attention (e.g., working memory) was also average. Executive functioning was also mildly variable, generally ranging from the well below average to average normative ranges.  While not directly assessed, receptive language abilities were believed to be intact. Likewise, Ms. Tow did not exhibit any difficulties comprehending task instructions and answered all questions asked of her appropriately. Assessed expressive language was variable. Verbal fluency was exceptionally low to well below average, while confrontation naming was below average.   Assessed visuospatial/visuoconstructional abilities were mildly variable but overall appropriate, ranging from the below average to above average normative ranges.    Learning (i.e., encoding) of novel verbal information was average. Spontaneous delayed recall (i.e., retrieval) of previously learned information was exceptionally low across a list learning task but average across a story learning task. Retention rates were 104% across a story learning task and 64% across a list learning task. Performance across recognition tasks was strong, suggesting evidence for information consolidation.   Results of emotional screening instruments suggested that recent symptoms of generalized anxiety were in the minimal range, while symptoms of depression were within normal limits. On a more comprehensive personality instrument, she elevated a clinical subscale surrounding somatic complaints. A screening instrument assessing recent sleep quality suggested the presence of mild sleep dysfunction.  Tables of Scores:   Note: This summary of test scores accompanies the interpretive report and should not be considered in isolation without reference to the appropriate sections in the text. Descriptors are based on appropriate normative  data and may be adjusted based on clinical judgment. The terms "impaired" and "within normal limits (WNL)" are used when a more specific level of functioning cannot be determined.       Validity Testing:   DESCRIPTOR       Test of Memory Malingering: --- --- ---    Trial 1 --- --- Below Expectation    Trial 2 --- --- Below Expectation    Retention --- --- Below Expectation  Dot Counting Test: --- --- Within Expectation  WAIS-IV Reliable Digit Span: --- --- Within Expectation  HVLT-R Recognition Discrimination Index: --- --- Within Expectation  D-KEFS Color Word Effort Index: --- --- Within Expectation       Orientation:      Raw Score Percentile   NAB Orientation, Form 1 29/29 --- ---       Intellectual Functioning:          Wechsler Adult Intelligence Scale (WAIS-IV):  Standard Score/ Scaled Score Percentile   Full Scale IQ  82 12 Below Average  GAI 80 9 Below Average  Verbal Comprehension Index: 76 5 Well Below Average    Similarities  5 5 Well Below Average    Vocabulary 6 9 Below Average    Information  6 9 Below Average  Perceptual Reasoning Index:  90 25 Average    Block Design  8 25 Average    Matrix Reasoning  10 50 Average    Visual Puzzles 7 16 Below Average  Working Memory Index: 97 42 Average    Digit Span 8 25 Average    Arithmetic  11 63 Average  Processing Speed Index: 84 14 Below Average    Symbol Search  9 37 Average    Coding 5 5 Well Below Average       Memory:          Wechsler Memory Scale (WMS-IV):                       Raw Score (Scaled Score) Percentile     Logical Memory I 24/50 (10) 50 Average    Logical Memory II 25/50 (11) 63 Average    Logical Memory Recognition 28/30 >75 Above Average       Hopkins Verbal Learning Test (HVLT-R), Form 1: Raw Score (T Score) Percentile     Total Trials 1-3 26/36 (43) 25 Average    Delayed Recall 7/12 (27) 1 Exceptionally Low    Recognition Discrimination Index 12 (58) 79 Above Average      True Positives  12 --- ---      False Positives 0 --- ---        Attention/Executive Function:          Trail Making Test (TMT): Raw Score (T Score) Percentile     Part A 32 secs.,  0 errors (40) 16 Below Average    Part B 50 secs.,  0 errors (52) 58 Average         Scaled Score Percentile   WAIS-IV Digit Span: 8 25 Average    Forward 8 25 Average    Backward 8 25 Average    Sequencing 9 37 Average       D-KEFS Color-Word Interference Test: Raw Score (Scaled Score) Percentile     Color Naming 33 secs. (7) 16 Below Average    Word Reading 20 secs. (11) 63 Average    Inhibition 49 secs. (10) 50 Average      Total Errors 0 errors (12) 75 Above Average    Inhibition/Switching 78 secs. (5) 5 Well Below Average      Total Errors 1 error (11) 63 Average       D-KEFS Verbal Fluency Test: Raw Score (Scaled Score) Percentile     Letter Total Correct 15 (3) 1 Exceptionally Low    Category Total Correct 25 (5) 5 Well Below Average    Category Switching Total Correct 10 (6) 9 Below Average    Category Switching Accuracy 9 (7) 16 Below Average      Total Set Loss Errors 0 (13) 84 Above Average      Total Repetition Errors 1 (11) 63 Average       D-KEFS 20 Questions Test: Scaled Score Percentile     Total Weighted Achievement Score 11 63 Average    Initial Abstraction Score 9 37 Average       Language:          NAB Language Module, Form 1: T Score Percentile     Naming 28/31 (39) 14 Below Average       Visuospatial/Visuoconstruction:          NAB Spatial Module, Form 1: T Score Percentile     Figure Drawing Copy 62 88 Above Average  Mood and Personality:      Raw Score Percentile   Beck Depression Inventory - II: 6 --- Within Normal Limits  PROMIS Anxiety Questionnaire: 12 --- None to Slight       Personality Assessment Inventory: T Score  Percentile     Inconsistency 52 --- Within Normal Limits    Infrequency 75 --- Invalid    Negative Impression 51 --- Within Normal Limits     Positive Impression 52 --- Within Normal Limits    Somatic Complaints 75 --- Elevated    Anxiety 58 --- Within Normal Limits    Anxiety-Related Disorders 46 --- Within Normal Limits    Depression 52 --- Within Normal Limits    Mania 46 --- Within Normal Limits    Paranoia 59 --- Within Normal Limits    Schizophrenia 42 --- Within Normal Limits    Borderline Features 46 --- Within Normal Limits    Antisocial Features 36 --- Within Normal Limits    Alcohol Problems 47 --- Within Normal Limits    Drug Problems 48 --- Within Normal Limits    Aggression 42 --- Within Normal Limits    Suicidal Ideation 43 --- Within Normal Limits    Stress 39 --- Within Normal Limits    Non Support 50 --- Within Normal Limits    Treatment Rejection 61 --- Within Normal Limits    Dominance 45 --- Within Normal Limits    Warmth 42 --- Within Normal Limits       Additional Questionnaires:      Raw Score Percentile   PROMIS Sleep Disturbance Questionnaire: 28 --- Mild   Informed Consent and Coding/Compliance:   The current evaluation represents a clinical evaluation for the purposes previously outlined by the referral source and is in no way reflective of a forensic evaluation.   Ms. Freer was provided with a verbal description of the nature and purpose of the present neuropsychological evaluation. Also reviewed were the foreseeable risks and/or discomforts and benefits of the procedure, limits of confidentiality, and mandatory reporting requirements of this provider. The patient was given the opportunity to ask questions and receive answers about the evaluation. Oral consent to participate was provided by the patient.   This evaluation was conducted by Christia Reading, Ph.D., licensed clinical neuropsychologist. Ms. Komatsu completed a clinical interview with Dr. Melvyn Novas, billed as one unit [phone], and 185 minutes of cognitive testing and scoring, billed as one unit [phone] and five additional units [id number]. Psychometrist  Milana Kidney, B.S., assisted Dr. Melvyn Novas with test administration and scoring procedures. As a separate and discrete service, Dr. Melvyn Novas spent a total of 160 minutes in interpretation and report writing billed as one unit [phone] and two units [id number].

## 2020-07-25 ENCOUNTER — Other Ambulatory Visit: Payer: Medicaid Other

## 2020-07-26 ENCOUNTER — Ambulatory Visit (HOSPITAL_BASED_OUTPATIENT_CLINIC_OR_DEPARTMENT_OTHER)
Admission: RE | Admit: 2020-07-26 | Discharge: 2020-07-26 | Disposition: A | Discharge status: Home / Self Care | Payer: Medicaid Other | Source: Ambulatory Visit | Attending: Obstetrics & Gynecology | Admitting: Obstetrics & Gynecology

## 2020-07-26 ENCOUNTER — Other Ambulatory Visit: Payer: Self-pay | Admitting: Family

## 2020-07-26 ENCOUNTER — Other Ambulatory Visit (INDEPENDENT_AMBULATORY_CARE_PROVIDER_SITE_OTHER): Payer: Self-pay | Admitting: Pediatric Endocrinology

## 2020-07-26 ENCOUNTER — Other Ambulatory Visit: Payer: Self-pay | Admitting: Pediatrics

## 2020-07-26 ENCOUNTER — Other Ambulatory Visit: Payer: Self-pay

## 2020-07-26 DIAGNOSIS — N921 Excessive and frequent menstruation with irregular cycle: Secondary | ICD-10-CM | POA: Diagnosis present

## 2020-07-26 DIAGNOSIS — IMO0002 Reserved for concepts with insufficient information to code with codable children: Secondary | ICD-10-CM

## 2020-07-26 DIAGNOSIS — E1065 Type 1 diabetes mellitus with hyperglycemia: Secondary | ICD-10-CM

## 2020-07-29 ENCOUNTER — Other Ambulatory Visit: Payer: Self-pay

## 2020-07-29 ENCOUNTER — Ambulatory Visit (INDEPENDENT_AMBULATORY_CARE_PROVIDER_SITE_OTHER): Payer: Medicaid Other | Admitting: Psychology

## 2020-07-29 DIAGNOSIS — E1065 Type 1 diabetes mellitus with hyperglycemia: Secondary | ICD-10-CM

## 2020-07-29 DIAGNOSIS — IMO0002 Reserved for concepts with insufficient information to code with codable children: Secondary | ICD-10-CM

## 2020-07-29 DIAGNOSIS — R4189 Other symptoms and signs involving cognitive functions and awareness: Secondary | ICD-10-CM

## 2020-07-29 DIAGNOSIS — E1040 Type 1 diabetes mellitus with diabetic neuropathy, unspecified: Secondary | ICD-10-CM

## 2020-07-29 NOTE — Telephone Encounter (Signed)
Called 07/29/20 no answer left VM

## 2020-07-29 NOTE — Progress Notes (Signed)
   Neuropsychology Feedback Session [name]. [facility] Department of Neurology  Reason for Referral:   Cassandra [name] is a 20 y.o. right-handed Caucasian female referred by Shon Millet, D.O., to characterize her current cognitive functioning and assist with diagnostic clarity and treatment planning in the context of subjective cognitive decline and difficult to manage type I diabetes.  Feedback:   [name] completed a comprehensive neuropsychological evaluation on 07/23/2020. Please refer to that encounter for the full report and recommendations. Briefly, results suggested a primary weakness surrounding verbal fluency, as well as generally mild performance variability surrounding processing speed, executive functioning, and retrieval aspects of memory. At present, the most likely culprit for ongoing mild cognitive dysfunction is related to her history of very difficult to manage type I diabetes. Meta-analyses examining cognitive effects of this condition in children and throughout early development highlight common areas of dysfunction surrounding verbal fluency and learning/memory. In meta-analyses utilizing a much broader age-grouping of adults, there is also report of mild difficulties surrounding processing speed and executive functioning, namely cognitive flexibility. [name]' pattern of performance across testing falls in line with empirical research on the matter.  [name] was unaccompanied during the current feedback session. Content of the current session focused on the results of her neuropsychological evaluation. [name] was given the opportunity to ask questions and her questions were answered. She was encouraged to reach out should additional questions arise. A copy of her report was provided at the conclusion of the visit.      Less than 16 minutes were spent conducting the current feedback session with [name], billed as one unit [phone].

## 2020-08-09 ENCOUNTER — Emergency Department (HOSPITAL_COMMUNITY): Payer: Medicaid Other

## 2020-08-09 ENCOUNTER — Emergency Department (HOSPITAL_COMMUNITY)
Admission: EM | Admit: 2020-08-09 | Discharge: 2020-08-10 | Disposition: A | Discharge status: Home / Self Care | Payer: Medicaid Other | Attending: Emergency Medicine | Admitting: Emergency Medicine

## 2020-08-09 DIAGNOSIS — Y92410 Unspecified street and highway as the place of occurrence of the external cause: Secondary | ICD-10-CM | POA: Insufficient documentation

## 2020-08-09 DIAGNOSIS — R000 Tachycardia, unspecified: Secondary | ICD-10-CM | POA: Diagnosis not present

## 2020-08-09 DIAGNOSIS — S0992XA Unspecified injury of nose, initial encounter: Secondary | ICD-10-CM | POA: Diagnosis present

## 2020-08-09 DIAGNOSIS — S060X0A Concussion without loss of consciousness, initial encounter: Secondary | ICD-10-CM | POA: Diagnosis not present

## 2020-08-09 DIAGNOSIS — S022XXA Fracture of nasal bones, initial encounter for closed fracture: Secondary | ICD-10-CM | POA: Insufficient documentation

## 2020-08-09 DIAGNOSIS — Y900 Blood alcohol level of less than 20 mg/100 ml: Secondary | ICD-10-CM | POA: Insufficient documentation

## 2020-08-09 DIAGNOSIS — Z23 Encounter for immunization: Secondary | ICD-10-CM | POA: Insufficient documentation

## 2020-08-09 DIAGNOSIS — R739 Hyperglycemia, unspecified: Secondary | ICD-10-CM | POA: Insufficient documentation

## 2020-08-09 LAB — I-STAT CHEM 8, ED
BUN: 10 mg/dL (ref 6–20)
Calcium, Ion: 0.87 mmol/L — CL (ref 1.15–1.40)
Chloride: 102 mmol/L (ref 98–111)
Creatinine, Ser: 0.9 mg/dL (ref 0.44–1.00)
Glucose, Bld: 414 mg/dL — ABNORMAL HIGH (ref 70–99)
HCT: 46 % (ref 36.0–46.0)
Hemoglobin: 15.6 g/dL — ABNORMAL HIGH (ref 12.0–15.0)
Potassium: 3.5 mmol/L (ref 3.5–5.1)
Sodium: 135 mmol/L (ref 135–145)
TCO2: 21 mmol/L — ABNORMAL LOW (ref 22–32)

## 2020-08-09 LAB — URINALYSIS, ROUTINE W REFLEX MICROSCOPIC
Bacteria, UA: NONE SEEN
Bilirubin Urine: NEGATIVE
Glucose, UA: >=500 mg/dL — AB
Hgb urine dipstick: NEGATIVE
Ketones, ur: 80 mg/dL — AB
Leukocytes,Ua: NEGATIVE
Nitrite: NEGATIVE
Protein, ur: NEGATIVE mg/dL
Specific Gravity, Urine: 1.026 (ref 1.005–1.030)
pH: 6 (ref 5.0–8.0)

## 2020-08-09 LAB — COMPREHENSIVE METABOLIC PANEL
ALT: 27 U/L (ref 0–44)
AST: 29 U/L (ref 15–41)
Albumin: 4.1 g/dL (ref 3.5–5.0)
Alkaline Phosphatase: 77 U/L (ref 38–126)
Anion gap: 14 (ref 5–15)
BUN: 10 mg/dL (ref 6–20)
CO2: 19 mmol/L — ABNORMAL LOW (ref 22–32)
Calcium: 7.6 mg/dL — ABNORMAL LOW (ref 8.9–10.3)
Chloride: 100 mmol/L (ref 98–111)
Creatinine, Ser: 1.04 mg/dL — ABNORMAL HIGH (ref 0.44–1.00)
GFR, Estimated: >60 mL/min (ref >60)
Glucose, Bld: 407 mg/dL — ABNORMAL HIGH (ref 70–99)
Potassium: 3.5 mmol/L (ref 3.5–5.1)
Sodium: 133 mmol/L — ABNORMAL LOW (ref 135–145)
Total Bilirubin: 0.7 mg/dL (ref 0.3–1.2)
Total Protein: 7.5 g/dL (ref 6.5–8.1)

## 2020-08-09 LAB — ETHANOL: Alcohol, Ethyl (B): <10 mg/dL (ref <10)

## 2020-08-09 LAB — CBC
HCT: 45.1 % (ref 36.0–46.0)
Hemoglobin: 14.8 g/dL (ref 12.0–15.0)
MCH: 27.7 pg (ref 26.0–34.0)
MCHC: 32.8 g/dL (ref 30.0–36.0)
MCV: 84.5 fL (ref 80.0–100.0)
Platelets: 304 10*3/uL (ref 150–400)
RBC: 5.34 MIL/uL — ABNORMAL HIGH (ref 3.87–5.11)
RDW: 13 % (ref 11.5–15.5)
WBC: 16.5 10*3/uL — ABNORMAL HIGH (ref 4.0–10.5)
nRBC: 0 % (ref 0.0–0.2)

## 2020-08-09 LAB — SAMPLE TO BLOOD BANK

## 2020-08-09 LAB — I-STAT BETA HCG BLOOD, ED (MC, WL, AP ONLY): I-stat hCG, quantitative: <5 m[IU]/mL (ref <5)

## 2020-08-09 LAB — LACTIC ACID, PLASMA: Lactic Acid, Venous: 2.2 mmol/L (ref 0.5–1.9)

## 2020-08-09 MED ORDER — TETANUS-DIPHTH-ACELL PERTUSSIS 5-2.5-18.5 LF-MCG/0.5 IM SUSY
0.5000 mL | PREFILLED_SYRINGE | Freq: Once | INTRAMUSCULAR | Status: AC
  Administered 2020-08-09: 0.5 mL via INTRAMUSCULAR

## 2020-08-09 NOTE — ED Notes (Signed)
Patient transported to CT 
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
## 2020-08-12 ENCOUNTER — Ambulatory Visit: Payer: Medicaid Other

## 2020-08-12 ENCOUNTER — Ambulatory Visit: Payer: Medicaid Other | Attending: Obstetrics & Gynecology

## 2020-08-13 ENCOUNTER — Other Ambulatory Visit: Payer: Self-pay | Admitting: Pediatrics

## 2020-08-13 ENCOUNTER — Other Ambulatory Visit: Payer: Self-pay | Admitting: Family

## 2020-08-14 ENCOUNTER — Ambulatory Visit (INDEPENDENT_AMBULATORY_CARE_PROVIDER_SITE_OTHER): Payer: Medicaid Other | Admitting: Pediatric Endocrinology

## 2020-08-19 ENCOUNTER — Ambulatory Visit: Payer: Medicaid Other | Admitting: Obstetrics & Gynecology

## 2020-08-27 ENCOUNTER — Ambulatory Visit (INDEPENDENT_AMBULATORY_CARE_PROVIDER_SITE_OTHER): Payer: Medicaid Other | Admitting: Pediatric Endocrinology

## 2020-08-27 ENCOUNTER — Other Ambulatory Visit: Payer: Self-pay

## 2020-08-27 ENCOUNTER — Encounter (INDEPENDENT_AMBULATORY_CARE_PROVIDER_SITE_OTHER): Payer: Self-pay | Admitting: Pediatric Endocrinology

## 2020-08-27 VITALS — BP 120/60 | HR 84 | Wt 163.0 lb

## 2020-08-27 DIAGNOSIS — E1065 Type 1 diabetes mellitus with hyperglycemia: Secondary | ICD-10-CM

## 2020-08-27 DIAGNOSIS — E892 Postprocedural hypoparathyroidism: Secondary | ICD-10-CM | POA: Diagnosis not present

## 2020-08-27 DIAGNOSIS — E890 Postprocedural hypothyroidism: Secondary | ICD-10-CM | POA: Diagnosis not present

## 2020-08-27 NOTE — Patient Instructions (Signed)
   Try using the exercise setting or the sleep setting when you are at work so that it has a higher target.
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
## 2020-08-28 LAB — COMPREHENSIVE METABOLIC PANEL
AG Ratio: 1.4 (calc) (ref 1.0–2.5)
ALT: 14 U/L (ref 6–29)
AST: 16 U/L (ref 10–30)
Albumin: 4.4 g/dL (ref 3.6–5.1)
Alkaline phosphatase (APISO): 86 U/L (ref 31–125)
BUN: 13 mg/dL (ref 7–25)
CO2: 24 mmol/L (ref 20–32)
Calcium: 8.7 mg/dL (ref 8.6–10.2)
Chloride: 101 mmol/L (ref 98–110)
Creat: 0.8 mg/dL (ref 0.50–0.96)
Globulin: 3.1 g/dL (calc) (ref 1.9–3.7)
Glucose, Bld: 223 mg/dL — ABNORMAL HIGH (ref 65–139)
Potassium: 3.9 mmol/L (ref 3.5–5.3)
Sodium: 137 mmol/L (ref 135–146)
Total Bilirubin: 0.4 mg/dL (ref 0.2–1.2)
Total Protein: 7.5 g/dL (ref 6.1–8.1)

## 2020-08-28 LAB — T4, FREE: Free T4: 1.5 ng/dL — ABNORMAL HIGH (ref 0.8–1.4)

## 2020-08-28 LAB — TSH: TSH: 1.42 mIU/L

## 2020-08-28 LAB — HEMOGLOBIN A1C
Hgb A1c MFr Bld: 7.7 % of total Hgb — ABNORMAL HIGH (ref <5.7)
Mean Plasma Glucose: 174 mg/dL
eAG (mmol/L): 9.7 mmol/L

## 2020-08-28 LAB — T3: T3, Total: 113 ng/dL (ref 86–192)

## 2020-09-01 ENCOUNTER — Other Ambulatory Visit (INDEPENDENT_AMBULATORY_CARE_PROVIDER_SITE_OTHER): Payer: Self-pay

## 2020-09-02 MED ORDER — DEXCOM G6 RECEIVER DEVI: 1.0000 | Freq: Every day | 0 refills | Status: AC | PRN

## 2020-09-09 ENCOUNTER — Ambulatory Visit: Payer: Medicaid Other | Admitting: Obstetrics & Gynecology

## 2020-09-20 DIAGNOSIS — N809 Endometriosis, unspecified: Secondary | ICD-10-CM | POA: Insufficient documentation

## 2020-10-10 ENCOUNTER — Other Ambulatory Visit: Payer: Self-pay | Admitting: Pediatrics

## 2020-11-01 IMAGING — US US THYROID
1 series · 14 of 25 positions shown · non-contrast
Comparison: None.

CLINICAL DATA: Goiter.

EXAM:
THYROID ULTRASOUND
TECHNIQUE: Ultrasound examination of the thyroid gland and adjacent soft
tissues was performed.

[Series 1: us thyroid · 0.05mm/px · 14 of 40 slices shown]
[im 1/40]
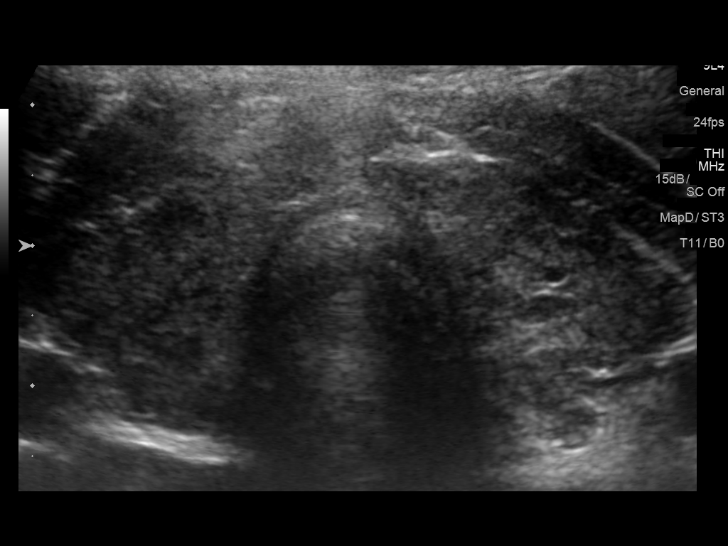
[im 4/40]
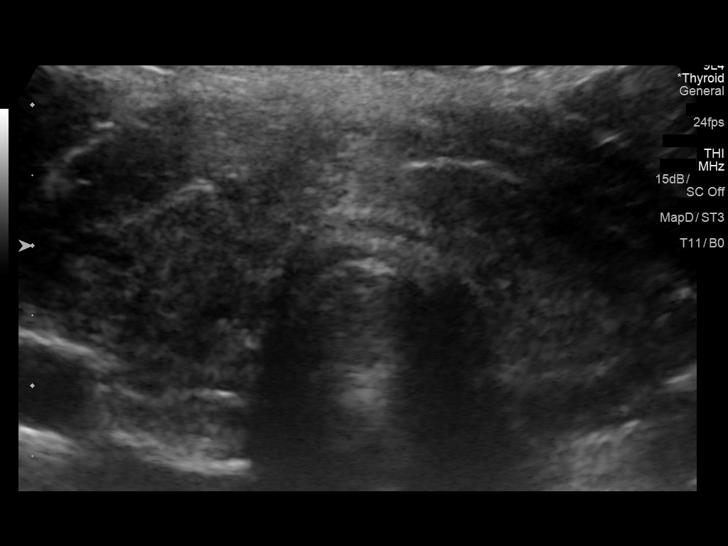
[im 7/40]
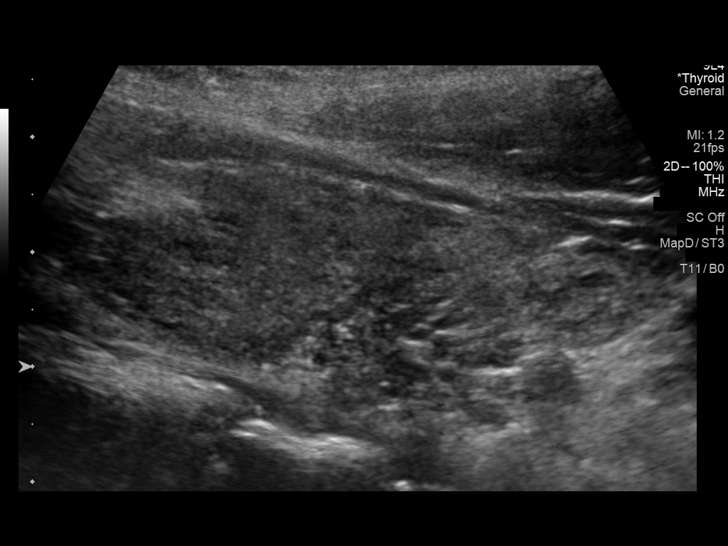
[im 10/40]
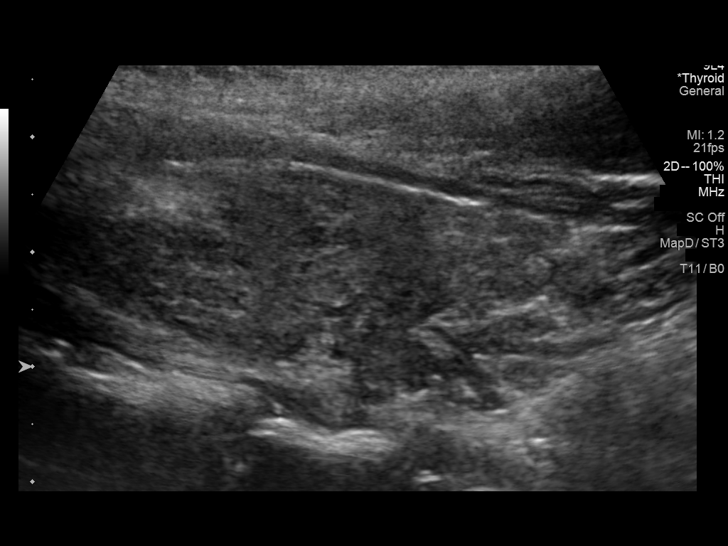
[im 14/40]
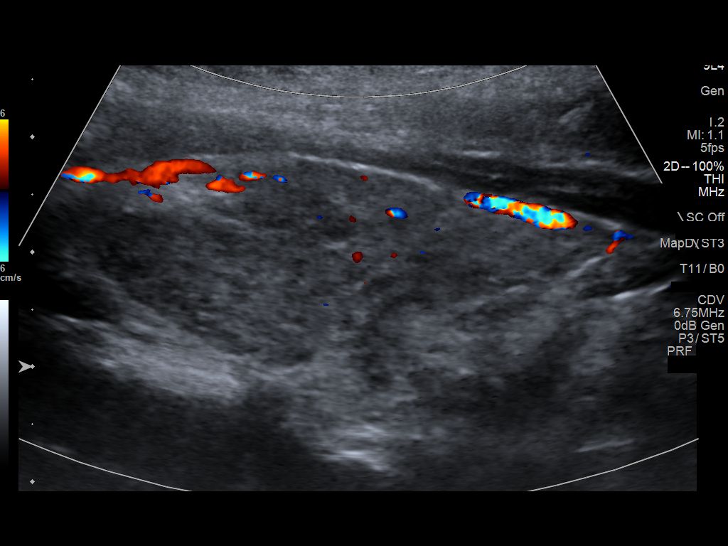
[im 15/40]
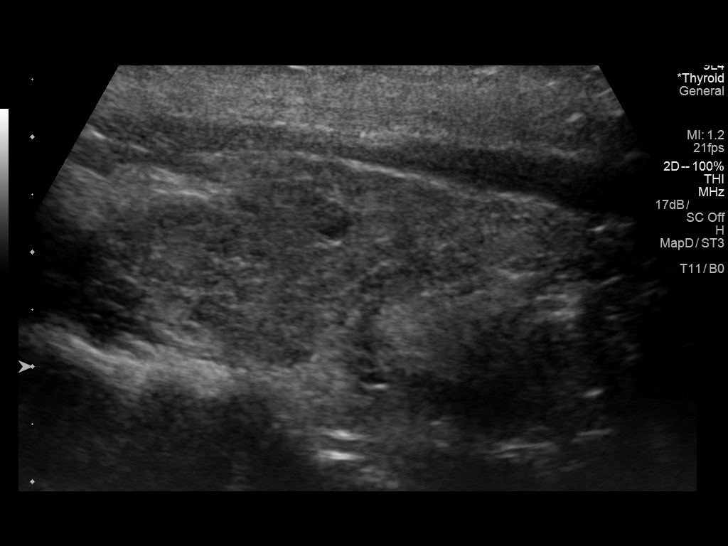
[im 18/40]
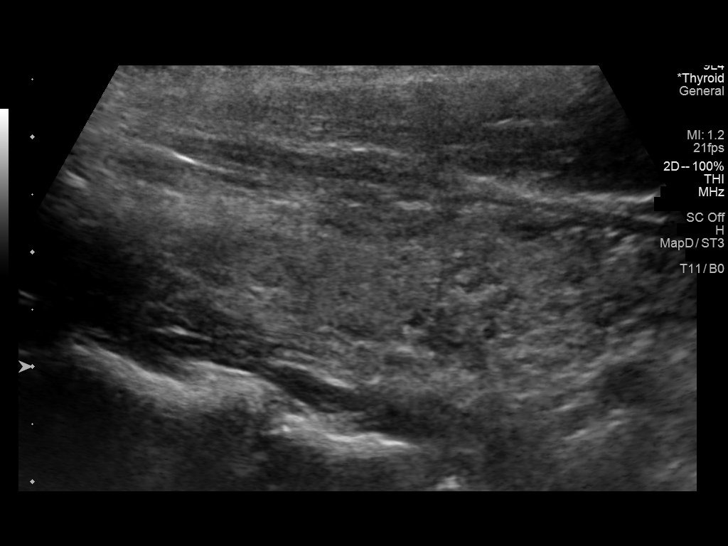
[im 22/40]
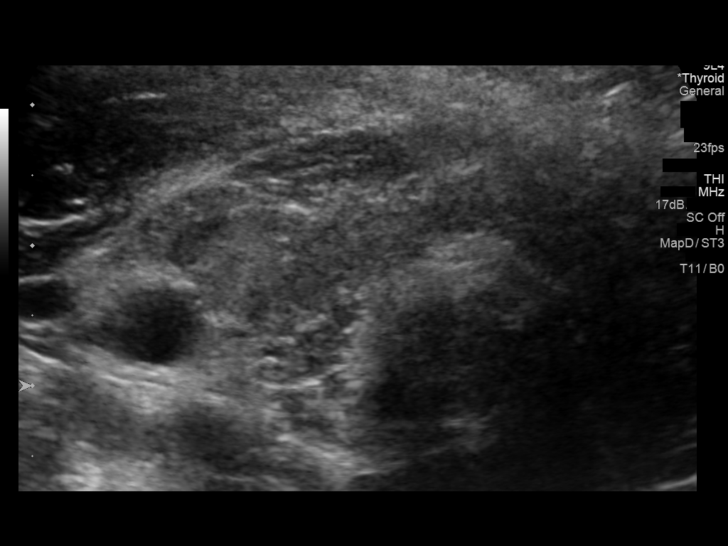
[im 25/40]
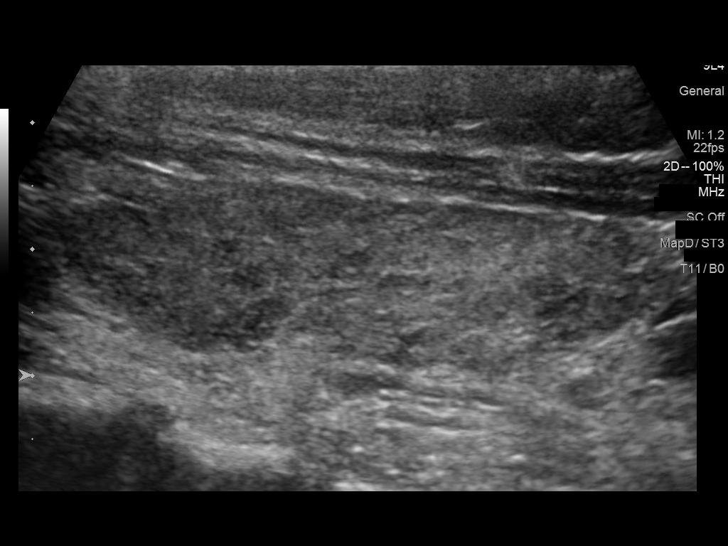
[im 27/40]
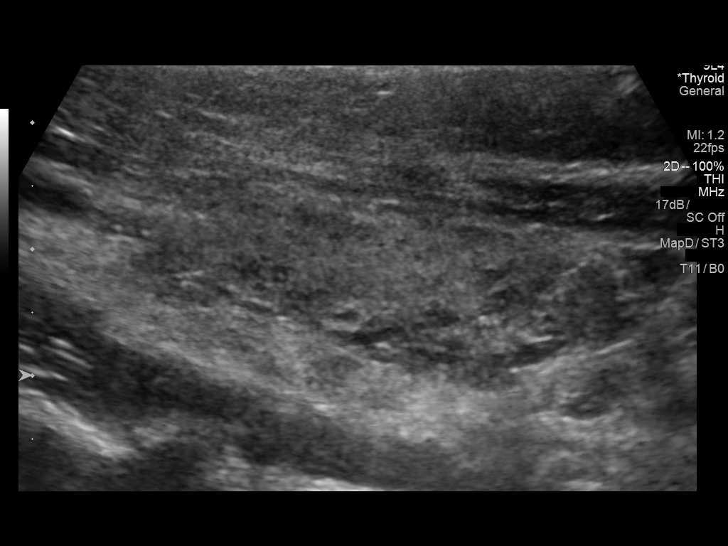
[im 30/40]
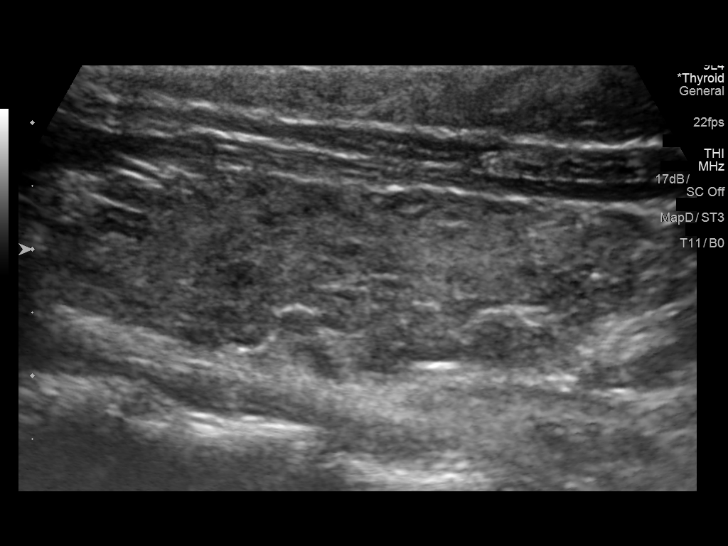
[im 33/40]
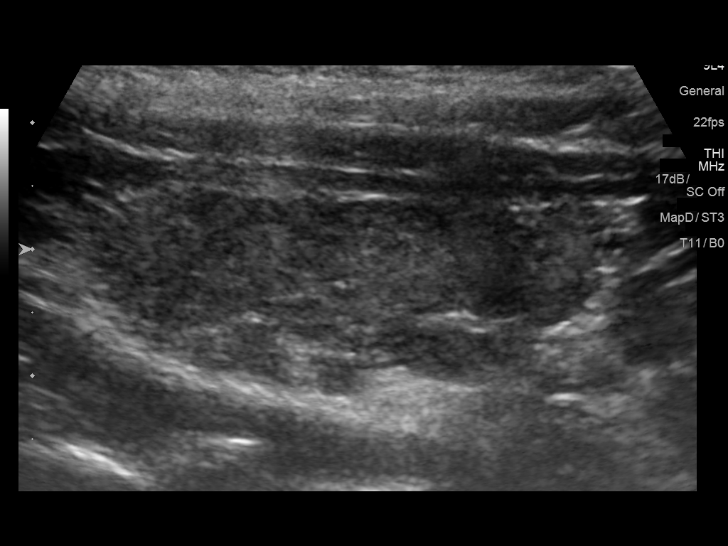
[im 36/40]
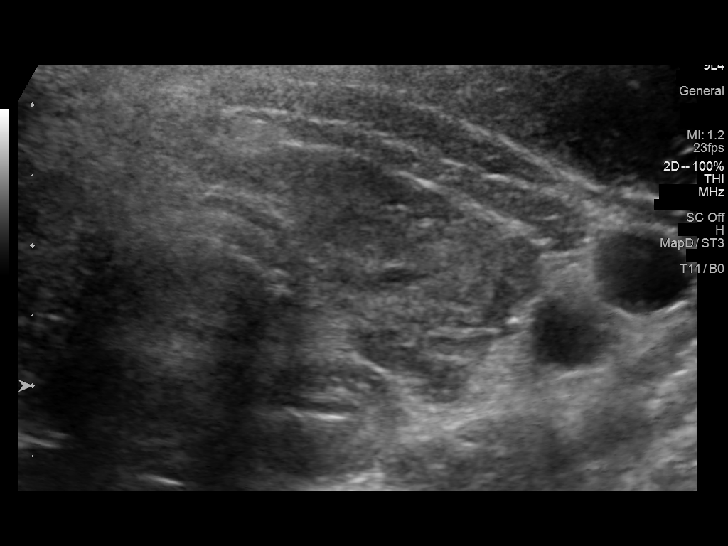
[im 40/40]
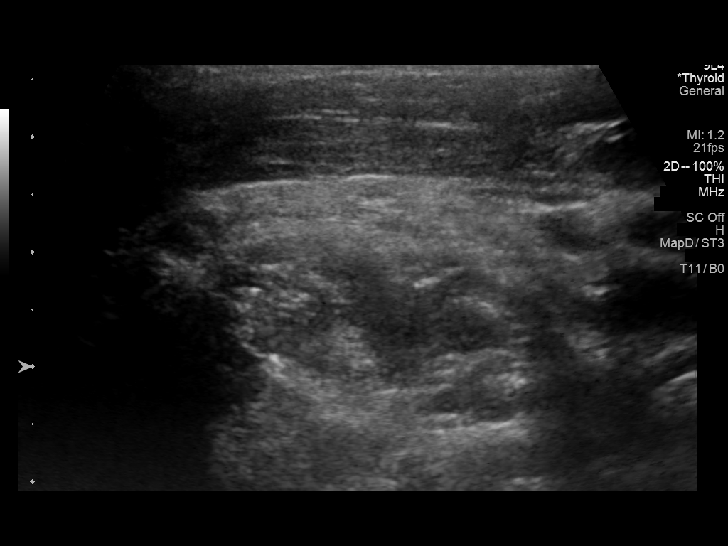

[14 of 25 positions shown; findings below may reference images not displayed]

FINDINGS: Parenchymal Echotexture: Markedly heterogenous

Isthmus: 4 mm

Right lobe: 5.1 x 2.0 x 1.7 cm

Left lobe: 5.0 x 1.3 x 1.6 cm

_________________________________________________________

Estimated total number of nodules >/= 1 cm: 0

Number of spongiform nodules >/=  2 cm not described below (TR1): 0

Number of mixed cystic and solid nodules >/= 1.5 cm not described
below (TR2): 0

_________________________________________________________

Marked gland heterogeneity and mild diffuse prominence. No
hypervascularity. No measurable focal abnormality, nodule or mass.
No regional adenopathy.
IMPRESSION: Nonspecific marked gland heterogeneity, suspect sequelae from prior
thyroiditis. No focal nodule.

The above is in keeping with the ACR TI-RADS recommendations - [HOSPITAL] 3001;[DATE].

## 2020-11-25 ENCOUNTER — Other Ambulatory Visit (INDEPENDENT_AMBULATORY_CARE_PROVIDER_SITE_OTHER): Payer: Self-pay | Admitting: Pediatric Endocrinology

## 2020-11-25 ENCOUNTER — Encounter (INDEPENDENT_AMBULATORY_CARE_PROVIDER_SITE_OTHER): Payer: Self-pay

## 2020-11-25 DIAGNOSIS — E1065 Type 1 diabetes mellitus with hyperglycemia: Secondary | ICD-10-CM

## 2020-11-27 ENCOUNTER — Encounter (INDEPENDENT_AMBULATORY_CARE_PROVIDER_SITE_OTHER): Payer: Self-pay | Admitting: Pediatric Endocrinology

## 2020-11-27 ENCOUNTER — Other Ambulatory Visit: Payer: Self-pay

## 2020-11-27 ENCOUNTER — Ambulatory Visit (INDEPENDENT_AMBULATORY_CARE_PROVIDER_SITE_OTHER): Payer: Medicaid Other | Admitting: Pediatric Endocrinology

## 2020-11-27 VITALS — BP 120/80 | HR 90 | Wt 162.0 lb

## 2020-11-27 DIAGNOSIS — E892 Postprocedural hypoparathyroidism: Secondary | ICD-10-CM

## 2020-11-27 DIAGNOSIS — Z4681 Encounter for fitting and adjustment of insulin pump: Secondary | ICD-10-CM | POA: Diagnosis not present

## 2020-11-27 DIAGNOSIS — E890 Postprocedural hypothyroidism: Secondary | ICD-10-CM

## 2020-11-27 DIAGNOSIS — E1065 Type 1 diabetes mellitus with hyperglycemia: Secondary | ICD-10-CM | POA: Diagnosis not present

## 2020-11-27 LAB — POCT GLUCOSE (DEVICE FOR HOME USE): POC Glucose: 162 mg/dl — AB (ref 70–99)

## 2020-11-27 LAB — POCT GLYCOSYLATED HEMOGLOBIN (HGB A1C): Hemoglobin A1C: 8 % — AB (ref 4.0–5.6)

## 2020-11-27 NOTE — Progress Notes (Signed)
Subjective:  Subjective  Patient Name: Cassandra [name] Date of Birth: 02-24-2000  MRN: [id number]  Cassandra [name]  presents to the office today for follow up evaluation and management of her type 1 diabetes and hypoglycemia, hypothyroidism, hypoparathyroidism  HISTORY OF PRESENT ILLNESS:   Cassandra [name] is a 20 y.o. Caucasian female   [name] came to her appointment alone.   1. Cassandra [name] was seen in the hospital at [facility] on October 12-15. She was admitted with hypoglycemia. She had previously been followed for her diabetes care at [facility] was diagnosed with type 1 diabetes at age 31. She was having leg cramps and she was having polyuria/polydipsia. Her BG at the PCP office was 564 mg/dL. She was sent to [facility] She was admitted there for 3 days for initial diabetes education. She transitioned care to [facility] in 2018. She started on a T-Slim insulin pump 12/09/17.   2. Cassandra [name] was last seen in pediatric endocrine clinic on 08/27/20.   She has been generally healthy. She did have some RUQ pain in August which was thought to be Gall Bladder related. She says that it lasted for a few weeks and then improved. She does continue to have intermittent pain- but not severe.   Post surgical hypoparathyroidism   She feels that overall she is doing well. She occasionally has some eye twitching but it is not consistent. She has not had any other muscle spasms or twitching.    1) Calcium 4 tabs 3 x daily  This is 6 grams per day. (OsCal 500/200). Need to take with food (12 tablets per day) - she has them in a pill sorter. Her mom is still helping to keep the pill sorter filled and remind her to take her doses. She also has a calcium liquid that she is taking once a day in addition to her pills. She does not know the strength. She says that the dose is one "scoop" in her drink. She says that you can drink it straight but it is bitter.  Smart A-Z Minerals Calcium Ionic Mineral Dietary Supplement.  65 mg of calcium per 5 mL.     2) Calcitriol 4 tabs with breakfast and 4 tabs with dinner    3) HCTZ 50 mg twice a day   4) She has been getting 1000 iu of vit D3 (in addition to the 200 IU/tab of Oscal).    5) She is taking Synthroid 150 mcg per day.   Appetite is "about the same". She feels that diarrhea is "not as bad". It comes and goes and is not every day. She has continued with her adult GI specialist. She has a diagnosis of IBD.  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 She says that they have continued to monitor her regularly. Her symptoms are about the same.   She is having issues with her teeth. Her dentist just started her on prescription toothpaste. She has more cavities and gum disease which he feels are secondary to her low calcium levels.   Thyroid/ post surgical hypothyroidism   Currently on 150 mcg LT4 daily.    Reports that she feels good on her current dose.  Energy level is ok She is no longer cold all the time- but she does feel cold at times Hair is breaking and shedding more  Type 1 diabetes, with hyperglycemia/hypoglycemia, uncontrolled   She has continued to have some hypoglycemia (mild) at work. Otherwise she feels that her pump  works really well. She likes that it is bolusing her throughout the day. She has not been as low as she used to get and overall has better control of her sugar. She is using Control IQ. She has not tried exercise mode when she is at work.   3. Pertinent Review of Systems:  Constitutional: The patient feels "good". The patient seems healthy and active.  Eyes: Vision seems to be good. There are no recognized eye problems. Wears glasses- got them fixed- they are at home. Saw Ophthalmology April 2021- Due now Neck: The patient has no complaints of anterior neck swelling, soreness, tenderness, pressure, discomfort, or difficulty swallowing.   Heart: Heart rate increases with exercise or other  physical activity. The patient has no complaints of palpitations, irregular heart beats, chest pain, or chest pressure.   Lungs: no asthma or wheezing.  Gastrointestinal: IBD followed by adult GI Legs: Muscle mass and strength seem normal. There are no complaints of numbness, tingling, burning, or pain. No edema is noted.  Feet: There are no obvious foot problems. There are no complaints of numbness, tingling, burning, or pain. No edema is noted. Neurologic: There are no recognized problems with muscle movement and strength, sensation, or coordination. Neuropathy in both feet. Sensation loss in right foot.  Intermittent pain in hands and feet. Followed by adult Neurology. Saw them about 6 weeks ago. They are holding off on Neurontin for now. They did refer to neuro psych for her memory loss GYN/GU:  Has had menorrhagia on Nexplanon and on Depo Provera. Currently not on any birth control.  Diabetes alert: Tattoo right wrist.   Annual labs: [name] Last done Jan 2022- no new issues      CGM Download:_              Tandem: She is programed for 32 units of basal but receiving >36 units of basal. Will increase bolus doses.            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/25/2019   S/P thyroidectomy 01/27/2019   Symptomatic mammary hypertrophy 03/17/2017   Thyroid goiter 05/09/2018   Type 1 diabetes, uncontrolled, 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    Family History  Problem Relation Age of Onset   Hypertension Maternal Grandmother    Anxiety disorder Maternal Grandmother    ADD / ADHD Maternal Grandmother    Diabetes Maternal Grandmother    Hyperlipidemia Maternal Grandfather    Hypothyroidism Maternal Grandfather    Cancer Maternal Grandfather        blood cancer (unknown name)   Hyperlipidemia Mother    Anxiety disorder Mother    Hyperlipidemia Other  Migraines Neg Hx    Seizures Neg Hx    Depression Neg Hx    Autism Neg Hx    Bipolar disorder Neg Hx    Schizophrenia Neg Hx      Current Outpatient Medications:    amitriptyline (ELAVIL) 75 MG tablet, Take 1 tablet (75 mg total) by mouth at bedtime., Disp: 90 tablet, Rfl: 1   Blood Glucose Monitoring Suppl (ACCU-CHEK GUIDE) w/Device KIT, 1 kit by Does not apply route daily as needed., Disp: 2 kit, Rfl: 5   buPROPion (WELLBUTRIN XL) 300 MG 24 hr tablet, Take 300 mg by mouth at bedtime., Disp: , Rfl:    calcitRIOL (ROCALTROL) 0.25 MCG capsule, Take 3 capsules (0.75 mcg total) by mouth in the morning and at bedtime., Disp: 270 capsule, Rfl: 5   CALCIUM PO, Take 6 tablets by mouth in the morning and at bedtime., Disp: , Rfl:    Continuous Blood Gluc Receiver ([place]) DEVI, 1 Device by Does not apply route daily as needed., Disp: 1 each, Rfl: 0   Continuous Blood Gluc Sensor (DEXCOM G6 SENSOR) MISC, Change sensor every 10 days as directed., Disp: 3 each, Rfl: 5   Continuous Blood Gluc Transmit (DEXCOM G6 TRANSMITTER) MISC, 1 kit by Does not apply route daily as needed., Disp: 1 each, Rfl: 1   HUMALOG 100 UNIT/ML injection, 0-300 Units. Via insulin pump every 3 days, Disp: , Rfl:    hydrochlorothiazide  (HYDRODIURIL) 25 MG tablet, Take 25 mg by mouth 2 (two) times daily., Disp: , Rfl:    insulin lispro (HUMALOG) 100 UNIT/ML injection, UP TO 300 UNITS IN INSULIN PUMP EVERY 48 HOURS, PER DKA AND HYPERGLYCEMIA PROTOCOLS, Disp: 40 mL, Rfl: 5   Insulin Syringe-Needle U-100 (INSULIN SYRINGE .3CC/29GX1/2") 29G X 1/2" 0.3 ML MISC, 1 each by Does not apply route 6 (six) times daily., Disp: 200 each, Rfl: 6   levocetirizine (XYZAL) 5 MG tablet, Take 5 mg by mouth every evening., Disp: , Rfl:    levothyroxine (SYNTHROID) 150 MCG tablet, Take 1 tablet (150 mcg total) by mouth daily., Disp: 30 tablet, Rfl: 11   metoprolol succinate (TOPROL-XL) 25 MG 24 hr tablet, Take 25 mg by mouth daily., Disp: , Rfl:    pantoprazole (PROTONIX) 40 MG tablet, Take 40 mg by mouth daily., Disp: , Rfl:    Vitamin D3 (VITAMIN D) 25 MCG tablet, Take 1 tablet (1,000 Units total) by mouth daily., Disp: 60 tablet, Rfl: 0   amitriptyline (ELAVIL) 75 MG tablet, Take 75 mg by mouth at bedtime. (Patient not taking: No sig reported), Disp: , Rfl:    Atogepant (QULIPTA) 60 MG TABS, Take 60 mg by mouth daily. (Patient not taking: Reported on 11/27/2020), Disp: 30 tablet, Rfl: 5   BAQSIMI TWO PACK 3 MG/DOSE POWD, Place 3 mg into both nostrils as needed (low blood sugar). (Patient not taking: No sig reported), Disp: , Rfl:    calcium-vitamin D (OSCAL WITH D) 500-200 MG-UNIT tablet, Take 4 tablets by mouth 3 (three) times daily., Disp: 360 tablet, Rfl: 11   DULoxetine (CYMBALTA) 20 MG capsule, Take 20 mg by mouth daily. (Patient not taking: No sig reported), Disp: , Rfl:    DULoxetine (CYMBALTA) 20 MG capsule, TAKE 1 CAPSULE BY MOUTH EVERY DAY (Patient not taking: Reported on 11/27/2020), Disp: 90 capsule, Rfl: 1   fluticasone (FLONASE) 50 MCG/ACT nasal spray, Place 1 spray into both nostrils 2 (two) times daily as needed for allergies or rhinitis. (Patient not taking:  Reported on 11/27/2020), Disp: , Rfl:    Glucagon (BAQSIMI TWO PACK) 3 MG/DOSE  POWD, Place 3 mg into the nose once as needed for up to 1 dose (for severe hypoglycemia when patient is unconcious). (Patient not taking: Reported on 08/27/2020), Disp: 2 each, Rfl: 3   hydrochlorothiazide (HYDRODIURIL) 50 MG tablet, Take 1 tablet (50 mg total) by mouth 2 (two) times daily. (Patient not taking: No sig reported), Disp: 60 tablet, Rfl: 3   ketoconazole (NIZORAL) 2 % cream, Apply 1 application topically daily. (Patient not taking: No sig reported), Disp: 60 g, Rfl: 1   ketoconazole (NIZORAL) 2 % cream, Apply 1 application topically every evening. (Patient not taking: No sig reported), Disp: , Rfl:    levocetirizine (XYZAL) 5 MG tablet, Take 5 mg by mouth every evening. (Patient not taking: No sig reported), Disp: , Rfl:    levothyroxine (SYNTHROID) 150 MCG tablet, Take 150 mcg by mouth daily before breakfast. (Patient not taking: No sig reported), Disp: , Rfl:    loratadine (CLARITIN) 10 MG tablet, Take 10 mg by mouth daily. (Patient not taking: Reported on 11/27/2020), Disp: , Rfl:    medroxyPROGESTERone (DEPO-PROVERA) 150 MG/ML injection, Inject 150 mg into the muscle every 3 (three) months. (Patient not taking: No sig reported), Disp: , Rfl:    metoprolol tartrate (LOPRESSOR) 25 MG tablet, Take 25 mg by mouth 2 (two) times daily. (Patient not taking: No sig reported), Disp: , Rfl:    nystatin (MYCOSTATIN/NYSTOP) powder, Apply 1 application topically 3 (three) times daily. (Patient not taking: No sig reported), Disp: 90 g, Rfl: 3   nystatin (MYCOSTATIN/NYSTOP) powder, Apply 1 application topically 3 (three) times daily. (Patient not taking: No sig reported), Disp: , Rfl:    pantoprazole (PROTONIX) 40 MG tablet, Take 40 mg by mouth daily as needed (heartburn). (Patient not taking: No sig reported), Disp: , Rfl:    Prenatal Vit-Fe Fumarate-FA (PRENATAL VITAMINS) 28-0.8 MG TABS, Take 1 tablet by mouth daily. (Patient not taking: Reported on 08/27/2020), Disp: 30 tablet, Rfl: 12   promethazine  (PHENERGAN) 12.5 MG tablet, Take 1 tablet (12.5 mg total) by mouth every 6 (six) hours as needed for nausea or vomiting. (Patient not taking: No sig reported), Disp: 30 tablet, Rfl: 5   promethazine (PHENERGAN) 12.5 MG tablet, Take 12.5 mg by mouth every 6 (six) hours as needed for nausea or vomiting. (Patient not taking: No sig reported), Disp: , Rfl:    rizatriptan (MAXALT-MLT) 10 MG disintegrating tablet, Take 1 tablet (10 mg total) by mouth as needed for migraine (may repeat in 2 hours.  maximum 2 tablets in 24 hours.). May repeat in 2 hours if needed (Patient not taking: No sig reported), Disp: 9 tablet, Rfl: 5  Allergies as of 11/27/2020 - Review Complete 11/27/2020  Allergen Reaction Noted   Amoxicillin-pot clavulanate Diarrhea and Nausea And Vomiting 11/14/2016   Fish allergy Diarrhea 11/14/2016   Lactose intolerance (gi) Diarrhea 11/14/2016   Shellfish-derived products Diarrhea and Nausea And Vomiting 11/14/2016   Tape Itching 11/14/2016   Augmentin [amoxicillin-pot clavulanate] Nausea And Vomiting 08/09/2020   Lactose intolerance (gi)  08/09/2020   Shellfish allergy Other (See Comments) 08/09/2020   Tilactase Nausea And Vomiting 03/07/2013   Latex Rash 11/14/2016   Latex Rash 08/09/2020     reports that she quit smoking about 22 months ago. She has a 0.65 pack-year smoking history. She has never used smokeless tobacco. She reports that she does not drink alcohol and does not use  drugs. Pediatric History  Patient Parents   Roman,Rebecca (Mother)   Roman,Rebecca (Mother)   Other Topics Concern   Not on file  Social History Narrative   Pt lives with mother. Mother is a [employer]ar[employer] manager. When not at home, someone is home with patient at all times. They have two dogs and three cats. Patient is graduate from [employer]; doing to cosmetology school. Pt enjoys softball, sleep, and be with family.    Right handed   Occasionally caffeine    1. School and Family:  Graduated  HS.. Currently with mom.   2. Activities: Will not age out of Medicaid at 34  Working at [employer] 3. Primary Care Provider: Aquilla Hacker, MD  ROS: There are no other significant problems involving Ambyr's other body systems.    Objective:  Objective  Vital Signs:   BP 120/80   Pulse 90   Wt 162 lb (73.5 kg)   BMI 31.64 kg/m   Growth percentile SmartLinks can only be used for patients less than 63 years old.  Ht Readings from Last 3 Encounters:  08/09/20 5' (1.524 m)  06/24/20 5' (1.524 m)  06/13/20 5' (1.524 m)   Wt Readings from Last 3 Encounters:  11/27/20 162 lb (73.5 kg)  08/27/20 163 lb (73.9 kg)  08/09/20 160 lb (72.6 kg)   HC Readings from Last 3 Encounters:  No data found for [facility]   Body surface area is 1.76 meters squared. Facility age limit for growth percentiles is 20 years. Facility age limit for growth percentiles is 20 years.  PHYSICAL EXAM:    Constitutional: The patient appears healthy and well nourished. The patient's height and weight are overweight for age.  Weight is stable Head: The head is normocephalic. Hair is thin Face: The face appears normal. There are no obvious dysmorphic features.  Eyes: The eyes appear to be normally formed and spaced. Gaze is conjugate. There is no obvious arcus or proptosis. Moisture appears normal.  Ears: The ears are normally placed and appear externally normal. Mouth: The oropharynx and tongue appear normal. Dentition appears to be normal for age. Oral moisture is normal. Neck: The neck appears to be visibly normal. Well healed surgical scar.  Lungs: Normal work of breathing Heart: good pulses and peripheral perfusion.  Abdomen: The abdomen appears to be normal in size for the patient's age. Bowel sounds are normal. There is no obvious hepatomegaly, splenomegaly, or other mass effect.  Arms: Muscle size and bulk are normal for age. Hands: There is no obvious tremor. Phalangeal and metacarpophalangeal joints are  normal. Palmar muscles are normal for age. Palmar skin is normal. Palmar moisture is also normal. Legs: Muscles appear normal for age. No edema is present.  Feet: Feet are normally formed. Dorsalis pedal pulses are normal. Neurologic: Strength is normal for age in both the upper and lower extremities. Muscle tone is normal.  GYN/GU: normal female  LAB DATA:     Lab Results  Component Value Date   HGBA1C 8.0 (A) 11/27/2020   HGBA1C 7.7 (H) 08/27/2020   HGBA1C 7.2 (A) 05/15/2020   HGBA1C 7.8 (A) 02/15/2020   HGBA1C 9.0 (A) 11/14/2019   HGBA1C 10.2 (A) 06/19/2019   HGBA1C 8.9 (A) 03/13/2019   HGBA1C 8.8 (A) 11/28/2018   HGBA1C 8.2 (A) 08/24/2018   HGBA1C 8.7 (A) 05/09/2018   HGBA1C 9.4 (A) 02/01/2018   HGBA1C 8.0 (H) 01/10/2018   HGBA1C 7.9 (H) 11/01/2017   HGBA1C 7.8 (H) 10/14/2017  HGBA1C 7.5 (A) 09/15/2017   HGBA1C 8.6 05/18/2017   HGBA1C 9.1 02/24/2017   HGBA1C 8.2 11/19/2016   Lab Results  Component Value Date   CALCIUM 8.7 08/27/2020   CALCIUM 7.6 (L) 08/09/2020   CALCIUM 8.8 06/17/2020   CALCIUM 8.8 05/15/2020   CALCIUM 8.9 04/21/2020   CALCIUM 9.4 04/21/2020   CALCIUM 8.6 (L) 04/20/2020   CALCIUM 8.7 (L) 04/20/2020   CALCIUM 8.2 (L) 04/19/2020   CALCIUM 8.2 (L) 04/17/2020   CALCIUM 8.6 04/15/2020   CALCIUM 7.0 (L) 04/09/2020   CALCIUM 9.1 02/15/2020   CALCIUM 9.1 02/15/2020   CALCIUM 8.7 (L) 12/25/2019   CALCIUM 8.3 (L) 12/08/2019   CALCIUM 8.7 (L) 11/29/2019   CALCIUM 7.4 (L) 11/20/2019   CALCIUM 7.7 (L) 10/31/2019   CALCIUM 7.9 (L) 10/13/2019    Results for orders placed or performed in visit on 11/27/20  POCT glycosylated hemoglobin (Hb A1C)  Result Value Ref Range   Hemoglobin A1C 8.0 (A) 4.0 - 5.6 %   HbA1c POC (<> result, manual entry)     HbA1c, POC (prediabetic range)     HbA1c, POC (controlled diabetic range)    POCT Glucose (Device for Home Use)  Result Value Ref Range   Glucose Fasting, POC     POC Glucose 162 (A) 70 - 99 mg/dl        Assessment and Plan:  Assessment  ASSESSMENT: Manjit is a 20 y.o. Caucasian female with type 1 diabetes, post surgical hypothyroidism, and post surgical hypoparathyroidism complicated by hypocalcemia  Post surgical hypothyroidism  - On LT4 150 mcg daily - Clinically euthyroid- other than hair loss - Repeat thyroid levels today  Post surgical hypocalcemia/hypoparathyroidism - She is currently asymptomatic.  - Continues on Calcium, Rocaltrol and Vit D3 replacement -D3 1000 IU/day + Oscal 500/200 - HCTZ 50 mg BID - Calcium 12 x 500 mg of elemental calcium = 6g of calcium/day - Repeat levels today  Diabetes, uncontrolled, with hyper and hypoglycemia  - on T-Slim pump and Dexcom - T slim pump was replaced in October 2021 - She is now on Control IQ and doing well - No recent severe hypoglycemia - POC A1C as above - POC CBG as above - labs today - Reviewed pump and dexcom downloads with patient in detail - insulin doses/pump settings changes as below    Pump settings:    Try using sleep mode or temp target of 150 for when you are at work.    Time Basal Rate Correction factor Carb ratio Target BG   12a 1.35 30 mg/dL 10 120 mg/dL  4a -> 3a 1.2 30 mg/dL 10 120 mg/dL  6a 1.25 30 mg/dL 10 120 mg/dL  8a 1.2 30 mg/dL 10 120 mg/dL  12PM (New) 1.2 30 mg/dL 8 120 mg/dL  3pm 1.25 72m/dL  9-> 8 12104mdL  9p 1.3 -> 1.35 30 mg/dL 9-> 8 120 mg/dL    Total Basal   30.25         Duration of insulin   3 hours        Maximum Bolus 12 Units        Carb (for calculation) On         Low Insulin Alert On- 30 Units         Auto Off Off        Quick Bolus Off        Basal IQ On  Memory loss/neuropathy  - she previously saw pediatric neurology for her neuropathy and was meant to be on Neurontin- but she could not swallow it.  - Has had visit with adult Neurology - Was referred for neuro cognitive testing - Discussed neuropathy and cold weather today  Hypertension:  -No longer  taking Lisinopril -BP normal today - On HCTZ to decrease calcium excretion in the kidneys    Lab Orders         Comprehensive metabolic panel         TSH         T4, free         VITAMIN D 25 Hydroxy (Vit-D Deficiency, Fractures)         Vitamin D 1,25 dihydroxy         POCT glycosylated hemoglobin (Hb A1C)         POCT Glucose (Device for Home Use)      Follow-up: Return in about 3 months (around 02/27/2021).  Patient feels anxious about potential transition to adult endocrine care. She will be 24 in March     Lelon Huh, MD  >40 minutes spent today reviewing the medical chart, counseling the patient/family, and documenting today's encounter.  When a patient is on insulin, intensive monitoring of blood glucose levels is necessary to avoid hyperglycemia and hypoglycemia. Severe hyperglycemia/hypoglycemia can lead to hospital admissions and be life threatening.    Patient referred by Aquilla Hacker, MD for type 1 diabetes.   Copy of this note sent to Aquilla Hacker, MD

## 2020-11-27 NOTE — Patient Instructions (Addendum)
    Time Basal Rate Correction factor Carb ratio Target BG   12a 1.35 30 mg/dL 10 197 mg/dL  4a -> 3a 1.2 30 mg/dL 10 588 mg/dL  6a 3.25 30 mg/dL 10 498 mg/dL  8a 1.2 30 mg/dL 10 264 mg/dL  15AX (New) 1.2 30 mg/dL 8 094 mg/dL  3pm 0.76 30mg /dL  9-> 8 120mg /dL  9p 1.3 -> 30 mg/dL 9-> 8 mg/dL    Total Basal   8.08          -Always have fast sugar with you in case of low blood sugar (glucose tabs, regular juice or soda, candy) -Always wear your ID that states you have diabetes -Always bring your meter to your visit -Call/Email if you want to review blood sugars -Are you due for an annual eye exam?

## 2020-11-30 LAB — COMPREHENSIVE METABOLIC PANEL
AG Ratio: 1.3 (calc) (ref 1.0–2.5)
ALT: 10 U/L (ref 6–29)
AST: 13 U/L (ref 10–30)
Albumin: 4.1 g/dL (ref 3.6–5.1)
Alkaline phosphatase (APISO): 76 U/L (ref 31–125)
BUN: 9 mg/dL (ref 7–25)
CO2: 19 mmol/L — ABNORMAL LOW (ref 20–32)
Calcium: 8.6 mg/dL (ref 8.6–10.2)
Chloride: 105 mmol/L (ref 98–110)
Creat: 0.7 mg/dL (ref 0.50–0.96)
Globulin: 3.2 g/dL (calc) (ref 1.9–3.7)
Glucose, Bld: 163 mg/dL — ABNORMAL HIGH (ref 65–139)
Potassium: 3.9 mmol/L (ref 3.5–5.3)
Sodium: 137 mmol/L (ref 135–146)
Total Bilirubin: 0.4 mg/dL (ref 0.2–1.2)
Total Protein: 7.3 g/dL (ref 6.1–8.1)

## 2020-11-30 LAB — TSH: TSH: 0.7 mIU/L

## 2020-11-30 LAB — VITAMIN D 1,25 DIHYDROXY
Vitamin D 1, 25 (OH)2 Total: 18 pg/mL (ref 18–72)
Vitamin D2 1, 25 (OH)2: <8 pg/mL
Vitamin D3 1, 25 (OH)2: 18 pg/mL

## 2020-11-30 LAB — VITAMIN D 25 HYDROXY (VIT D DEFICIENCY, FRACTURES): Vit D, 25-Hydroxy: 28 ng/mL — ABNORMAL LOW (ref 30–100)

## 2020-11-30 LAB — T4, FREE: Free T4: 1.5 ng/dL — ABNORMAL HIGH (ref 0.8–1.4)

## 2020-12-07 ENCOUNTER — Encounter (INDEPENDENT_AMBULATORY_CARE_PROVIDER_SITE_OTHER): Payer: Self-pay

## 2020-12-16 DIAGNOSIS — Z113 Encounter for screening for infections with a predominantly sexual mode of transmission: Secondary | ICD-10-CM

## 2020-12-17 ENCOUNTER — Ambulatory Visit: Payer: Medicaid Other | Admitting: Pediatrics

## 2020-12-22 DIAGNOSIS — Z113 Encounter for screening for infections with a predominantly sexual mode of transmission: Secondary | ICD-10-CM

## 2020-12-23 ENCOUNTER — Other Ambulatory Visit: Payer: Self-pay | Admitting: Pediatrics

## 2020-12-23 MED ORDER — FLUCONAZOLE 150 MG PO TABS: ORAL_TABLET | ORAL | 0 refills | Status: DC

## 2021-01-02 ENCOUNTER — Ambulatory Visit: Payer: Medicaid Other | Admitting: Pediatrics

## 2021-02-07 ENCOUNTER — Other Ambulatory Visit (HOSPITAL_COMMUNITY)
Admission: RE | Admit: 2021-02-07 | Discharge: 2021-02-07 | Disposition: A | Discharge status: Home / Self Care | Payer: Medicaid Other | Source: Ambulatory Visit | Attending: Pediatrics | Admitting: Pediatrics

## 2021-02-07 ENCOUNTER — Other Ambulatory Visit: Payer: Medicaid Other

## 2021-02-07 ENCOUNTER — Other Ambulatory Visit: Payer: Self-pay

## 2021-02-07 DIAGNOSIS — Z113 Encounter for screening for infections with a predominantly sexual mode of transmission: Secondary | ICD-10-CM

## 2021-02-09 ENCOUNTER — Other Ambulatory Visit (INDEPENDENT_AMBULATORY_CARE_PROVIDER_SITE_OTHER): Payer: Self-pay | Admitting: Pediatric Endocrinology

## 2021-02-09 DIAGNOSIS — E890 Postprocedural hypothyroidism: Secondary | ICD-10-CM

## 2021-02-10 LAB — URINE CYTOLOGY ANCILLARY ONLY
Bacterial Vaginitis-Urine: NEGATIVE
Candida Urine: NEGATIVE
Chlamydia: NEGATIVE
Comment: NEGATIVE
Comment: NEGATIVE
Comment: NORMAL
Neisseria Gonorrhea: NEGATIVE
Trichomonas: NEGATIVE

## 2021-02-10 LAB — WET PREP BY MOLECULAR PROBE
Candida species: NOT DETECTED
MICRO NUMBER:: 12837714
SPECIMEN QUALITY:: ADEQUATE
Trichomonas vaginosis: NOT DETECTED

## 2021-02-12 MED ORDER — METRONIDAZOLE 500 MG PO TABS: 500.0000 mg | ORAL_TABLET | Freq: Two times a day (BID) | ORAL | 0 refills | Status: AC

## 2021-02-24 ENCOUNTER — Encounter (INDEPENDENT_AMBULATORY_CARE_PROVIDER_SITE_OTHER): Payer: Self-pay | Admitting: Pediatric Endocrinology

## 2021-02-27 ENCOUNTER — Ambulatory Visit (INDEPENDENT_AMBULATORY_CARE_PROVIDER_SITE_OTHER): Payer: Medicaid Other | Admitting: Pediatric Endocrinology

## 2021-02-27 ENCOUNTER — Encounter (INDEPENDENT_AMBULATORY_CARE_PROVIDER_SITE_OTHER): Payer: Self-pay | Admitting: Pediatric Endocrinology

## 2021-03-18 ENCOUNTER — Ambulatory Visit (INDEPENDENT_AMBULATORY_CARE_PROVIDER_SITE_OTHER): Payer: Medicaid Other | Admitting: Pediatric Endocrinology

## 2021-03-18 ENCOUNTER — Encounter (INDEPENDENT_AMBULATORY_CARE_PROVIDER_SITE_OTHER): Payer: Self-pay | Admitting: Pediatric Endocrinology

## 2021-03-18 ENCOUNTER — Other Ambulatory Visit: Payer: Self-pay

## 2021-03-18 VITALS — BP 122/70 | HR 68 | Wt 162.8 lb

## 2021-03-18 DIAGNOSIS — E10649 Type 1 diabetes mellitus with hypoglycemia without coma: Secondary | ICD-10-CM

## 2021-03-18 DIAGNOSIS — E892 Postprocedural hypoparathyroidism: Secondary | ICD-10-CM | POA: Diagnosis not present

## 2021-03-18 DIAGNOSIS — E890 Postprocedural hypothyroidism: Secondary | ICD-10-CM

## 2021-03-18 LAB — POCT GLUCOSE (DEVICE FOR HOME USE): POC Glucose: 278 mg/dl — AB (ref 70–99)

## 2021-03-18 LAB — POCT GLYCOSYLATED HEMOGLOBIN (HGB A1C): Hemoglobin A1C: 7.9 % — AB (ref 4.0–5.6)

## 2021-03-18 MED ORDER — CALCITRIOL 0.25 MCG PO CAPS: ORAL_CAPSULE | ORAL | 5 refills | Status: DC

## 2021-03-18 NOTE — Progress Notes (Signed)
Subjective:  Subjective  Patient Name: Cassandra [name] Date of Birth: 2000-07-05  MRN: [id number]  Buelah Rennie  presents to the office today for follow up evaluation and management of her type 1 diabetes and hypoglycemia, hypothyroidism, hypoparathyroidism  HISTORY OF PRESENT ILLNESS:   Cassandra [name] is a 21 y.o. Caucasian female   [place] came to her appointment alone.   1. Cassandra [name] was seen in the hospital at [facility] pediatrics on October 12-15. She was admitted with hypoglycemia. She had previously been followed for her diabetes care at Home was diagnosed with type 1 diabetes at age 1. She was having leg cramps and she was having polyuria/polydipsia. Her BG at the PCP office was 564 mg/dL. She was sent to [facility] She was admitted there for 3 days for initial diabetes education. She transitioned care to [facility] in 2018. She started on a T-Slim insulin pump 12/09/17.   2. Cassandra [name] was last seen in pediatric endocrine clinic on 11/27/20.   She has been generally healthy. She is having issues with her work schedule and it is making it difficult for her to take all her medications on schedule.    Post surgical hypoparathyroidism   She feels that overall she is doing well. She has not been having any muscle twitching.   1) Calcium 4 tabs 3 x daily  This is 6 grams per day. (OsCal 500/200). Need to take with food (12 tablets per day) - she has them in a pill sorter. Her mom is still helping to keep the pill sorter filled and remind her to take her doses. She also has a calcium liquid that she is taking once a day in addition to her pills:  Smart A-Z Minerals Calcium Ionic Mineral Dietary Supplement. 65 mg of calcium per 5 mL.     2) Calcitriol 3 tabs with breakfast and 2 tabs with dinner    3) HCTZ 50 mg twice a day   4) She has been getting 1000 iu of vit D3 (in addition to the 200 IU/tab of Oscal).    Appetite is "decent". Diarrhea is "not as bad". She has been diagnosed with  IBD and follows with adult GI.   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 She reports that it has been 1 year since her last visit and she needs to go back and see him..   She is having ongoing issues with her teeth. She had 4 cavities and has 4 more to go. She is using prescription toothpaste from her dentist to help with the decalcification of her teeth.   Thyroid/ post surgical hypothyroidism   Currently on 150 mcg LT4 daily.    Reports that she feels good on her current dose.  Energy level is decent She is not as cold as she was previously Hair has continued to shed some Some diarrhea Periods are monthly- sometimes twice monthly (every 2 weeks). She says that she will return to her OB to discuss  Type 1 diabetes, with hyperglycemia/hypoglycemia, uncontrolled   She is wearing a Tandem T-Slim pump in Control IQ with Dexcom.  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 Her current boss refuses to allow her to only work mornings (her previous assignment). She has had some hypoglycemia at work. On the 12th she was hypoglycemic for about 2 hours on her Dexcom. She says that she did check on a meter and it was real. Her sugar then shot  up due to too many carbs ingested trying to correct the low.   She also feels that she is dropping low when she wakes up. Due to her swing shifts she is sometimes waking up in the morning and sometimes in the afternoon/evening. She states that it does not matter when she is waking up- she drops just before getting out of bed.   Yesterday she woke up with a high sugar because she had somehow removed her pump in the middle of the night.    3. Pertinent Review of Systems:  Constitutional: The patient feels "good". The patient seems healthy and active.  Eyes: Vision seems to be good. There are no recognized eye problems. Wears glasses- got them fixed- they are at home. Saw Ophthalmology  November 2022. Going back this month for glaucoma/visual field testing.  Neck: The patient has no complaints of anterior neck swelling, soreness, tenderness, pressure, discomfort, or difficulty swallowing.   Heart: Heart rate increases with exercise or other physical activity. The patient has no complaints of palpitations, irregular heart beats, chest pain, or chest pressure.   Lungs: no asthma or wheezing.  Gastrointestinal: IBD followed by adult GI Legs: Muscle mass and strength seem normal. There are no complaints of numbness, tingling, burning, or pain. No edema is noted.  Feet: There are no obvious foot problems. There are no complaints of numbness, tingling, burning, or pain. No edema is noted. Neurologic: There are no recognized problems with muscle movement and strength, sensation, or coordination. Neuropathy in both feet. Sensation loss in right foot.  Intermittent pain in hands and feet. They did refer to neuro psych for her memory loss. She was diagnosed with short term memory loss. She is not currently taking any neurontin.  GYN/GU:  Has had menorrhagia on Nexplanon and on Depo Provera. Currently not on any birth control.  Diabetes alert: Tattoo right wrist.   Annual labs: [name] Last done Jan 2022- no new issues      CGM Download:_                Tandem: She is bolusing appropriately and her Control IQ is also helping.            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/25/2019   S/P thyroidectomy 01/27/2019   Symptomatic mammary hypertrophy 03/17/2017   Thyroid goiter 05/09/2018   Type 1 diabetes, uncontrolled, 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    Family History  Problem Relation Age of Onset   Hypertension Maternal Grandmother    Anxiety disorder Maternal Grandmother    ADD / ADHD Maternal Grandmother    Diabetes Maternal Grandmother    Hyperlipidemia Maternal  Grandfather    Hypothyroidism Maternal Grandfather    Cancer Maternal Grandfather        blood cancer (unknown name)   Hyperlipidemia Mother    Anxiety disorder Mother    Hyperlipidemia Other    Migraines Neg Hx    Seizures Neg Hx    Depression Neg Hx    Autism Neg Hx    Bipolar disorder Neg Hx    Schizophrenia Neg Hx      Current Outpatient Medications:    amitriptyline (ELAVIL) 75 MG tablet, Take 1 tablet (75 mg total) by mouth at bedtime., Disp: 90 tablet, Rfl: 1   buPROPion (WELLBUTRIN XL) 300 MG 24 hr tablet, Take 300 mg by mouth at bedtime., Disp: , Rfl:    calcitRIOL (ROCALTROL) 0.25 MCG capsule, Take 3 capsules (0.75 mcg total) by mouth in the morning and at bedtime., Disp: 270 capsule, Rfl: 5   CALCIUM PO, Take 6 tablets by mouth in the morning and at bedtime., Disp: , Rfl:    calcium-vitamin D (OSCAL WITH D) 500-200 MG-UNIT tablet, Take 4 tablets by mouth 3 (three) times daily., Disp: 360 tablet, Rfl: 11   Continuous Blood Gluc Sensor (DEXCOM G6 SENSOR) MISC, Change sensor every 10 days as directed., Disp: 3 each, Rfl: 5   Continuous Blood Gluc Transmit (DEXCOM G6 TRANSMITTER) MISC, 1 kit by Does not apply route daily as needed., Disp: 1 each, Rfl: 1   fluticasone (FLONASE) 50 MCG/ACT  nasal spray, Place into the nose., Disp: , Rfl:    hydrochlorothiazide (HYDRODIURIL) 25 MG tablet, Take 25 mg by mouth 2 (two) times daily., Disp: , Rfl:    insulin lispro (HUMALOG) 100 UNIT/ML injection, UP TO 300 UNITS IN INSULIN PUMP EVERY 48 HOURS, PER DKA AND HYPERGLYCEMIA PROTOCOLS, Disp: 40 mL, Rfl: 5   ketoconazole (NIZORAL) 2 % cream, Apply topically., Disp: , Rfl:    levocetirizine (XYZAL) 5 MG tablet, Take 5 mg by mouth every evening., Disp: , Rfl:    levothyroxine (SYNTHROID) 150 MCG tablet, TAKE 1 TABLET BY MOUTH EVERY DAY, Disp: 90 tablet, Rfl: 3   metoprolol succinate (TOPROL-XL) 25 MG 24 hr tablet, Take 25 mg by mouth daily., Disp: , Rfl:    naproxen sodium (ALEVE) 220 MG tablet, Take by mouth., Disp: , Rfl:    nystatin (MYCOSTATIN/NYSTOP) powder, Apply topically., Disp: , Rfl:    pantoprazole (PROTONIX) 40 MG tablet, Take 40 mg by mouth daily., Disp: , Rfl:    Vitamin D3 (VITAMIN D) 25 MCG tablet, Take 1 tablet (1,000 Units total) by mouth daily., Disp: 60 tablet, Rfl: 0   Blood Glucose Monitoring Suppl (ACCU-CHEK GUIDE) w/Device KIT, 1 kit by Does not apply route daily as needed. (Patient not taking: Reported on 03/18/2021), Disp: 2 kit, Rfl: 5   Continuous Blood Gluc Receiver (DEXCOM G6 RECEIVER) DEVI, 1 Device by Does not apply route daily as needed. (Patient not taking: Reported on 03/18/2021), Disp: 1 each, Rfl: 0   insulin degludec (TRESIBA) 200 UNIT/ML FlexTouch Pen, Inject into the skin. (Patient not taking: Reported on 03/18/2021), Disp: , Rfl:    Insulin Syringe-Needle U-100 (INSULIN SYRINGE .3CC/29GX1/2") 29G X 1/2" 0.3 ML MISC, 1 each by Does not apply route 6 (six) times daily. (Patient not taking: Reported on 03/18/2021), Disp: 200 each, Rfl: 6  Allergies as of 03/18/2021 - Review Complete 03/18/2021  Allergen Reaction Noted   Amoxicillin-pot clavulanate Diarrhea and Nausea And Vomiting 11/14/2016   Fish allergy Diarrhea 11/14/2016   Lactose intolerance (gi)  Diarrhea  11/14/2016   Shellfish-derived products Diarrhea and Nausea And Vomiting 11/14/2016   Tape Itching 11/14/2016   Augmentin [amoxicillin-pot clavulanate] Nausea And Vomiting 08/09/2020   Lactose intolerance (gi)  08/09/2020   Shellfish allergy Other (See Comments) 08/09/2020   Tilactase Nausea And Vomiting 03/07/2013   Latex Rash 11/14/2016   Latex Rash 08/09/2020     reports that she quit smoking about 2 years ago. Her smoking use included cigarettes. She has a 0.65 pack-year smoking history. She has been exposed to tobacco smoke. She has never used smokeless tobacco. She reports that she does not drink alcohol and does not use drugs. Pediatric History  Patient Parents   Roman,Rebecca (Mother)   Roman,Rebecca (Mother)   Other Topics Concern   Not on file  Social History Narrative   Pt lives with mother. Mother is a Quarry manager.  They have two dogs and three cats. Patient is graduate from [employer]; Pt enjoys softball, sleep, and be with family.    Right handed   Occasionally caffeine   Works at [employer] Teacher, English as a foreign language)     1. School and Family:  Graduated HS.. Currently with mom.   2. 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  3. Primary Care Provider: Aquilla Hacker, MD  ROS: There are no other significant problems involving Courtlynn's other body systems.    Objective:  Objective  Vital Signs:   BP 122/70    Pulse 68    Wt 162 lb 12.8 oz (73.8 kg)    LMP 02/19/2021    BMI 31.79 kg/m   Growth percentile SmartLinks can only be used for patients less than 50 years old.  Ht Readings from Last 3 Encounters:  08/09/20 5' (1.524 m)  06/24/20 5' (1.524 m)  06/13/20 5' (1.524 m)   Wt Readings from Last 3 Encounters:  03/18/21 162 lb 12.8 oz (73.8 kg)  11/27/20 162 lb (73.5 kg)  08/27/20 163 lb (73.9 kg)   HC Readings from Last 3 Encounters:  No data found for [facility]   Body surface area is 1.77 meters  squared. Facility age limit for growth percentiles is 20 years. Facility age limit for growth percentiles is 20 years.  PHYSICAL EXAM:    Constitutional: The patient appears healthy and well nourished. The patient's height and weight are overweight for age.  Weight is very stable Head: The head is normocephalic. Hair is thin but long Face: The face appears normal. There are no obvious dysmorphic features.  Eyes: The eyes appear to be normally formed and spaced. Gaze is conjugate. There is no obvious arcus or proptosis. Moisture appears normal.  Ears: The ears are normally placed and appear externally normal. Mouth: The oropharynx and tongue appear normal. Dentition appears to be normal for age. Oral moisture is normal. Neck: The neck appears to be visibly normal. Well healed surgical scar.  Lungs: Normal work of breathing Heart: good pulses and peripheral perfusion.  Abdomen: The abdomen appears to be normal in size for the patient's age. Bowel sounds are normal. There is no obvious hepatomegaly, splenomegaly, or other mass effect.  Arms: Muscle size and bulk are normal for age. Hands: There is no obvious tremor. Phalangeal and metacarpophalangeal joints are normal. Palmar muscles are normal for age. Palmar skin is normal. Palmar moisture is also normal. Legs: Muscles appear normal for age. No edema is present.  Feet: Feet are normally formed. Dorsalis pedal pulses are normal.  Neurologic: Strength is normal for age in both the upper and lower extremities. Muscle tone is normal.  GYN/GU: normal female  LAB DATA:   pending today  Lab Results  Component Value Date   HGBA1C 7.9 (A) 03/18/2021   HGBA1C 8.0 (A) 11/27/2020   HGBA1C 7.7 (H) 08/27/2020   HGBA1C 7.2 (A) 05/15/2020   HGBA1C 7.8 (A) 02/15/2020   HGBA1C 9.0 (A) 11/14/2019   HGBA1C 10.2 (A) 06/19/2019   HGBA1C 8.9 (A) 03/13/2019   HGBA1C 8.8 (A) 11/28/2018   HGBA1C 8.2 (A) 08/24/2018   HGBA1C 8.7 (A) 05/09/2018   HGBA1C 9.4  (A) 02/01/2018   HGBA1C 8.0 (H) 01/10/2018   HGBA1C 7.9 (H) 11/01/2017   HGBA1C 7.8 (H) 10/14/2017   HGBA1C 7.5 (A) 09/15/2017   HGBA1C 8.6 05/18/2017   HGBA1C 9.1 02/24/2017   HGBA1C 8.2 11/19/2016   Lab Results  Component Value Date   CALCIUM 8.6 11/27/2020   CALCIUM 8.7 08/27/2020   CALCIUM 7.6 (L) 08/09/2020   CALCIUM 8.8 06/17/2020   CALCIUM 8.8 05/15/2020   CALCIUM 8.9 04/21/2020   CALCIUM 9.4 04/21/2020   CALCIUM 8.6 (L) 04/20/2020   CALCIUM 8.7 (L) 04/20/2020   CALCIUM 8.2 (L) 04/19/2020   CALCIUM 8.2 (L) 04/17/2020   CALCIUM 8.6 04/15/2020   CALCIUM 7.0 (L) 04/09/2020   CALCIUM 9.1 02/15/2020   CALCIUM 9.1 02/15/2020   CALCIUM 8.7 (L) 12/25/2019   CALCIUM 8.3 (L) 12/08/2019   CALCIUM 8.7 (L) 11/29/2019   CALCIUM 7.4 (L) 11/20/2019   CALCIUM 7.7 (L) 10/31/2019    Results for orders placed or performed in visit on 03/18/21  POCT Glucose (Device for Home Use)  Result Value Ref Range   Glucose Fasting, POC     POC Glucose 278 (A) 70 - 99 mg/dl  POCT glycosylated hemoglobin (Hb A1C)  Result Value Ref Range   Hemoglobin A1C 7.9 (A) 4.0 - 5.6 %   HbA1c POC (<> result, manual entry)     HbA1c, POC (prediabetic range)     HbA1c, POC (controlled diabetic range)         Assessment and Plan:  Assessment  ASSESSMENT: Novalee is a 21 y.o. Caucasian female with type 1 diabetes, post surgical hypothyroidism, and post surgical hypoparathyroidism complicated by hypocalcemia  Post surgical hypothyroidism  - On LT4 150 mcg daily - Clinically euthyroid- other than hair loss - Repeat thyroid levels again today  Post surgical hypocalcemia/hypoparathyroidism - She is currently asymptomatic.  - Continues on Calcium, Rocaltrol and Vit D3 replacement -D3 1000 IU/day + Oscal 500/200 - HCTZ 50 mg BID - Calcitriol 3 caps (0.75 mcg) am and 2 caps (0.5 mcg) pm  - Calcium 12 x 500 mg of elemental calcium = 6g of calcium/day She is taking an extra 65 mg of calcium as well -  Repeat levels today for calcium, vit D, phos, mag  Diabetes, uncontrolled, with hyper and hypoglycemia  - on T-Slim pump and Dexcom - T slim pump was replaced in October 2021 - She is now on Control IQ and doing well - Had a prolonged hypolgycemic event on Sunday at work.  - POC A1C as above - POC CBG as above - annual labs today - Reviewed pump and dexcom downloads with patient in detail  Pump settings:    Try using sleep mode or temp target of 150 for when you are at work.    Time Basal Rate Correction factor Carb ratio Target BG   12a 1.35 30 mg/dL  10 120 mg/dL  3a 1.2 30 mg/dL 10 120 mg/dL  6a 1.25 30 mg/dL 10 120 mg/dL  8a 1.2 30 mg/dL 10 120 mg/dL  12PM 1.2 30 mg/dL 8 120 mg/dL  3pm 1.25 29m/dL  9-> 8 1240mdL  9p 1.35 30 mg/dL 9-> 8 120 mg/dL    Total Basal   30.25         Duration of insulin   3 hours        Maximum Bolus 12 Units        Carb (for calculation) On         Low Insulin Alert On- 30 Units         Auto Off Off        Quick Bolus Off        Basal IQ On            Memory loss/neuropathy  - she previously saw pediatric neurology for her neuropathy and was meant to be on Neurontin- but she could not swallow it.  - Has established adult Neurology - Not currently taking neurontin - Neuro-cognitive testing revealed issues with short term memory  Hypertension:  -No longer taking Lisinopril -BP normal today - On HCTZ to decrease calcium excretion in the kidneys    Lab Orders         Comprehensive metabolic panel         TSH         T4, free         VITAMIN D 25 Hydroxy (Vit-D Deficiency, Fractures)         Magnesium         Phosphorus         Lipid panel         Microalbumin / creatinine urine ratio         POCT Glucose (Device for Home Use)         POCT glycosylated hemoglobin (Hb A1C)      Follow-up: Return in about 3 months (around 06/15/2021).  [facility] Dr. ShKelton PillarPatient to cancel appointment with me  after she establishes care there.      JeLelon HuhMD  >40 minutes spent on day of encounter reviewing the medical chart, counseling the patient/family, and documenting encounter.  Additional time spent 03/19/21 to complete documentation. When a patient is on insulin, intensive monitoring of blood glucose levels is necessary to avoid hyperglycemia and hypoglycemia. Severe hyperglycemia/hypoglycemia can lead to hospital admissions and be life threatening.    Patient referred by VaAquilla HackerMD for type 1 diabetes.   Copy of this note sent to VaAquilla HackerMD

## 2021-03-19 ENCOUNTER — Encounter (INDEPENDENT_AMBULATORY_CARE_PROVIDER_SITE_OTHER): Payer: Self-pay | Admitting: Pediatric Endocrinology

## 2021-03-19 LAB — COMPREHENSIVE METABOLIC PANEL
AG Ratio: 1.5 (calc) (ref 1.0–2.5)
ALT: 11 U/L (ref 6–29)
AST: 14 U/L (ref 10–30)
Albumin: 4.3 g/dL (ref 3.6–5.1)
Alkaline phosphatase (APISO): 71 U/L (ref 31–125)
BUN: 16 mg/dL (ref 7–25)
CO2: 23 mmol/L (ref 20–32)
Calcium: 7.6 mg/dL — ABNORMAL LOW (ref 8.6–10.2)
Chloride: 103 mmol/L (ref 98–110)
Creat: 0.88 mg/dL (ref 0.50–0.96)
Globulin: 2.9 g/dL (calc) (ref 1.9–3.7)
Glucose, Bld: 194 mg/dL — ABNORMAL HIGH (ref 65–139)
Potassium: 3.7 mmol/L (ref 3.5–5.3)
Sodium: 138 mmol/L (ref 135–146)
Total Bilirubin: 0.4 mg/dL (ref 0.2–1.2)
Total Protein: 7.2 g/dL (ref 6.1–8.1)

## 2021-03-19 LAB — PHOSPHORUS: Phosphorus: 4.3 mg/dL (ref 2.7–5.0)

## 2021-03-19 LAB — T4, FREE: Free T4: 1.7 ng/dL — ABNORMAL HIGH (ref 0.8–1.4)

## 2021-03-19 LAB — LIPID PANEL
Cholesterol: 141 mg/dL (ref <200)
HDL: 45 mg/dL — ABNORMAL LOW (ref >=50)
LDL Cholesterol (Calc): 72 mg/dL (calc)
Non-HDL Cholesterol (Calc): 96 mg/dL (calc) (ref <130)
Total CHOL/HDL Ratio: 3.1 (calc) (ref <5.0)
Triglycerides: 158 mg/dL — ABNORMAL HIGH (ref <150)

## 2021-03-19 LAB — MICROALBUMIN / CREATININE URINE RATIO
Creatinine, Urine: 237 mg/dL (ref 20–275)
Microalb Creat Ratio: 2 mcg/mg creat (ref <30)
Microalb, Ur: 0.4 mg/dL

## 2021-03-19 LAB — MAGNESIUM: Magnesium: 1.8 mg/dL (ref 1.5–2.5)

## 2021-03-19 LAB — VITAMIN D 25 HYDROXY (VIT D DEFICIENCY, FRACTURES): Vit D, 25-Hydroxy: 21 ng/mL — ABNORMAL LOW (ref 30–100)

## 2021-03-19 LAB — TSH: TSH: 1.2 mIU/L

## 2021-03-21 ENCOUNTER — Encounter (INDEPENDENT_AMBULATORY_CARE_PROVIDER_SITE_OTHER): Payer: Self-pay | Admitting: Pediatric Endocrinology

## 2021-03-25 ENCOUNTER — Other Ambulatory Visit: Payer: Self-pay | Admitting: Pediatrics

## 2021-03-25 MED ORDER — BUPROPION HCL ER (XL) 150 MG PO TB24: 150.0000 mg | ORAL_TABLET | ORAL | 2 refills | Status: DC

## 2021-03-28 ENCOUNTER — Encounter (INDEPENDENT_AMBULATORY_CARE_PROVIDER_SITE_OTHER): Payer: Self-pay | Admitting: Pediatric Endocrinology

## 2021-04-04 ENCOUNTER — Other Ambulatory Visit (INDEPENDENT_AMBULATORY_CARE_PROVIDER_SITE_OTHER): Payer: Self-pay | Admitting: Pediatric Endocrinology

## 2021-04-04 DIAGNOSIS — E1040 Type 1 diabetes mellitus with diabetic neuropathy, unspecified: Secondary | ICD-10-CM

## 2021-04-16 ENCOUNTER — Ambulatory Visit
Admission: EM | Admit: 2021-04-16 | Discharge: 2021-04-16 | Disposition: A | Discharge status: Home / Self Care | Payer: Medicaid Other | Attending: Internal Medicine | Admitting: Internal Medicine

## 2021-04-16 ENCOUNTER — Other Ambulatory Visit: Payer: Self-pay

## 2021-04-16 DIAGNOSIS — M545 Low back pain, unspecified: Secondary | ICD-10-CM

## 2021-04-16 LAB — POCT URINALYSIS DIP (MANUAL ENTRY)
Bilirubin, UA: NEGATIVE
Blood, UA: NEGATIVE
Glucose, UA: 500 mg/dL — AB
Leukocytes, UA: NEGATIVE
Nitrite, UA: NEGATIVE
Protein Ur, POC: NEGATIVE mg/dL
Spec Grav, UA: >=1.03 — AB (ref 1.010–1.025)
Urobilinogen, UA: 0.2 E.U./dL
pH, UA: 6 (ref 5.0–8.0)

## 2021-04-16 MED ORDER — METHOCARBAMOL 500 MG PO TABS: 500.0000 mg | ORAL_TABLET | Freq: Two times a day (BID) | ORAL | 0 refills | Status: DC | PRN

## 2021-04-16 NOTE — ED Triage Notes (Signed)
Pt c/o lower back pain onset yesterday. States has T1DM and concerned for kidney infection.  ?

## 2021-04-16 NOTE — ED Provider Notes (Signed)
[facility]    CSN: [id number] Arrival date & time: 04/16/21  1827      History   Chief Complaint Chief Complaint  Patient presents with   Back Pain    HPI Cassandra [name] is a 21 y.o. female.   Patient presents with right lower back pain that started yesterday.  Denies any apparent injury to the area.  Denies history of chronic back pain.  Pain does not radiate.  Pain is constant per patient.  No aggravating or relieving factors for pain.  Patient has taken ibuprofen with minimal improvement.  Pain is rated 8/10 on pain scale.  Pain would like urine test completed to test for urinary tract infection given her history of type 1 diabetes.  Although, she denies any urinary burning, urinary frequency, urinary urgency.   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/25/2019   S/P thyroidectomy 01/27/2019   Symptomatic mammary hypertrophy 03/17/2017   Thyroid goiter 05/09/2018   Type 1 diabetes, uncontrolled, 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    Patient Active Problem List   Diagnosis Date Noted   Dysrhythmia 07/23/2020   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   Hypocalcemia 02/07/2019   S/P thyroidectomy  01/27/2019   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/25/2019   Hashimoto's disease 01/2019   Hypomagnesemia    Hypokalemia    Major depressive disorder    MRSA cellulitis    GERD (gastroesophageal reflux disease)    Migraine syndrome 03/17/2017   Symptomatic mammary hypertrophy 03/17/2017   Insomnia 02/17/2017   Generalized anxiety disorder 12/14/2016   Type 1 diabetes, uncontrolled, 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   Hypertension 11/15/2016   Hypoglycemia 11/14/2016   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   Dyslipidemia 04/08/2014    Past Surgical History:  Procedure Laterality Date   ADENOIDECTOMY     COLONOSCOPY     ESOPHAGOGASTRODUODENOSCOPY     THYROIDECTOMY N/A 01/25/2019   Procedure: COMPLETE THYROIDECTOMY;  Surgeon: Newman Pies, MD;  Location: [facility] OR;  Service: ENT;  Laterality: N/A;   TOE SURGERY Bilateral    for ingrown toenails   TYMPANOSTOMY TUBE PLACEMENT     WISDOM TOOTH EXTRACTION      OB History     Gravida  0   Para  0   Term  0   Preterm  0   AB  0   Living  0      SAB  0   IAB  0   Ectopic  0   Multiple  0   Live Births  0            Home Medications    Prior to Admission medications  Medication Sig Start Date End Date Taking? Authorizing Provider  methocarbamol (ROBAXIN) 500 MG tablet Take 1 tablet (500 mg total) by mouth 2 (two) times daily as needed for muscle spasms. 04/16/21  Yes Paytience Bures, Acie Fredrickson, FNP  amitriptyline (ELAVIL) 75 MG tablet Take 1 tablet (75 mg total) by mouth at bedtime. 07/05/20   Verneda Skill, FNP  Blood Glucose Monitoring Suppl (ACCU-CHEK GUIDE) w/Device KIT 1 kit by Does not apply route daily as needed. Patient not taking: Reported on 03/18/2021 05/02/18   [name], MD  buPROPion (WELLBUTRIN XL) 150 MG 24 hr tablet Take 1 tablet (150 mg total) by mouth every morning. 03/25/21 03/25/22  Verneda Skill, FNP  calcitRIOL (ROCALTROL) 0.25 MCG capsule Take 3 capsules (0.75  mcg) am and 2 capsules (0.5 mcg) pm 03/18/21   [name], MD  CALCIUM PO Take 6 tablets by mouth in the morning and at bedtime.    [provider]  calcium-vitamin D (OSCAL WITH D) 500-200 MG-UNIT tablet Take 4 tablets by mouth 3 (three) times daily. 05/15/20 03/18/21  [name], MD  Continuous Blood Gluc Receiver (DEXCOM G6 RECEIVER) DEVI 1 Device by Does not apply route daily as needed. Patient not taking: Reported on 03/18/2021 09/02/20   [name], MD  Continuous Blood Gluc Sensor (DEXCOM G6 SENSOR) MISC Change sensor every 10 days as directed. 11/25/20   [name], MD  Continuous Blood Gluc Transmit (DEXCOM G6 TRANSMITTER) MISC 1 kit by Does not apply route daily as needed. 08/02/19   [name], MD  fluticasone (FLONASE) 50 MCG/ACT nasal spray Place into the nose.    [provider]  hydrochlorothiazide (HYDRODIURIL) 25 MG tablet Take 25 mg by mouth 2 (two) times daily.    [provider]  insulin degludec (TRESIBA) 200 UNIT/ML FlexTouch Pen Inject into the skin. Patient not taking: Reported on 03/18/2021    [provider]  insulin lispro (HUMALOG) 100 UNIT/ML injection INJECT UP TO 300 UNITS IN INSULIN PUMP EVERY 48 HOURS, PER DKA AND HYPERGLYCEMIA PROTOCOLS 04/07/21   [name], MD  Insulin Syringe-Needle U-100 (INSULIN SYRINGE .3CC/29GX1/2") 29G X 1/2" 0.3 ML MISC 1 each by Does not apply route 6 (six) times daily. Patient not taking: Reported on 03/18/2021 11/24/17   [name], MD  ketoconazole (NIZORAL) 2 % cream Apply topically. 07/26/20   [provider]  levocetirizine (XYZAL) 5 MG tablet Take 5 mg by mouth every evening.    [provider]  levothyroxine (SYNTHROID) 150 MCG tablet TAKE 1 TABLET BY MOUTH EVERY DAY 02/10/21   [name], MD  metoprolol succinate (TOPROL-XL) 25 MG 24 hr tablet Take 25 mg by mouth daily.    [provider]  naproxen sodium (ALEVE) 220 MG tablet Take by mouth.     [provider]  nystatin (MYCOSTATIN/NYSTOP) powder Apply topically. 07/10/20   [provider]  pantoprazole (PROTONIX) 40 MG tablet Take 40 mg by mouth daily.    [provider]  Vitamin D3 (VITAMIN D) 25 MCG tablet Take 1 tablet (1,000 Units total) by mouth daily. 04/22/20   Littie Deeds, MD    Family History Family History  Problem Relation Age of Onset   Hypertension Maternal Grandmother    Anxiety disorder Maternal Grandmother    ADD / ADHD Maternal Grandmother    Diabetes Maternal Grandmother    Hyperlipidemia Maternal Grandfather    Hypothyroidism Maternal Grandfather    Cancer Maternal Grandfather  blood cancer (unknown name)   Hyperlipidemia Mother    Anxiety disorder Mother    Hyperlipidemia Other    Migraines Neg Hx    Seizures Neg Hx    Depression Neg Hx    Autism Neg Hx    Bipolar disorder Neg Hx    Schizophrenia Neg Hx     Social History Social History   Tobacco Use   Smoking status: Former    Packs/day: 0.13    Years: 5.00    Pack years: 0.65    Types: Cigarettes    Quit date: 01/23/2019    Years since quitting: 2.2    Passive exposure: Current (mom smokes)   Smokeless tobacco: Never  Vaping Use   Vaping Use: Never used  Substance Use Topics   Alcohol use: No   Drug use: No     Allergies   Amoxicillin-pot clavulanate, Fish allergy, Lactose intolerance (gi), Shellfish-derived products, Tape, Augmentin [amoxicillin-pot clavulanate], Lactose intolerance (gi), Shellfish allergy, Tilactase, Latex, and Latex   Review of Systems Review of Systems Per HPI  Physical Exam Triage Vital Signs ED Triage Vitals [04/16/21 1855]  Enc Vitals Group     BP 117/89     Pulse Rate (!) 125     Resp 18     Temp 98.8 F (37.1 C)     Temp Source Oral     SpO2 99 %     Weight      Height      Head Circumference      Peak Flow      Pain Score 0     Pain Loc      Pain Edu?      Excl. in GC?    No data found.  Updated  Vital Signs BP 117/89 (BP Location: Left Arm)   Pulse (!) 125   Temp 98.8 F (37.1 C) (Oral)   Resp 18   SpO2 99%   Visual Acuity Right Eye Distance:   Left Eye Distance:   Bilateral Distance:    Right Eye Near:   Left Eye Near:    Bilateral Near:     Physical Exam Constitutional:      General: She is not in acute distress.    Appearance: Normal appearance. She is not toxic-appearing or diaphoretic.  HENT:     Head: Normocephalic and atraumatic.  Eyes:     Extraocular Movements: Extraocular movements intact.     Conjunctiva/sclera: Conjunctivae normal.  Cardiovascular:     Rate and Rhythm: Normal rate and regular rhythm.     Pulses: Normal pulses.     Heart sounds: Normal heart sounds.  Pulmonary:     Effort: Pulmonary effort is normal. No respiratory distress.     Breath sounds: Normal breath sounds.  Abdominal:     General: Bowel sounds are normal. There is no distension.     Palpations: Abdomen is soft.     Tenderness: There is no abdominal tenderness.  Musculoskeletal:     Cervical back: Normal.     Thoracic back: Normal.     Lumbar back: Tenderness present. No swelling, edema or bony tenderness. Negative right straight leg raise test and negative left straight leg raise test.       Back:     Comments: Tenderness to palpation to right lower lumbar region.  No direct spinal tenderness, crepitus, step-off.  No flank pain.  Neurological:     General: No focal deficit present.     Mental Status:  She is alert and oriented to person, place, and time. Mental status is at baseline.     Deep Tendon Reflexes: Reflexes are normal and symmetric.  Psychiatric:        Mood and Affect: Mood normal.        Behavior: Behavior normal.        Thought Content: Thought content normal.        Judgment: Judgment normal.     UC Treatments / Results  Labs (all labs ordered are listed, but only abnormal results are displayed) Labs Reviewed  POCT URINALYSIS DIP (MANUAL ENTRY) -  Abnormal; Notable for the following components:      Result Value   Clarity, UA cloudy (*)    Glucose, UA =500 (*)    Ketones, POC UA moderate (40) (*)    Spec Grav, UA >=1.030 (*)    All other components within normal limits    EKG   Radiology No results found.  Procedures Procedures (including critical care time)  Medications Ordered in UC Medications - No data to display  Initial Impression / Assessment and Plan / UC Course  I have reviewed the triage vital signs and the nursing notes.  Pertinent labs & imaging results that were available during my care of the patient were reviewed by me and considered in my medical decision making (see chart for details).     Urinalysis not indicating urinary tract infection.  It does show elevated glucose in urine as well as some ketones.  Patient has Dexcom and reports that her current blood sugar was 220, and her blood sugars have been well controlled over the past few days.  Patient does have a history of DKA but reports that she is not experiencing any of those symptoms at this time.  Low suspicion for DKA at this time given that blood sugars are normal and patient's vital signs and physical exam appear normal as well.  Patient was advised to follow-up with primary care in the next few days to have urine rechecked.  Patient was agreeable.  I suspect the patient's pain is musculoskeletal in nature and may be muscular injury such as lumbar strain as pain is reproducible with palpation.  Will prescribe Robaxin muscle relaxer.  Patient does take Elavil but patient was advised to not take these medications within the same amount of time as it can cause increased drowsiness.  Patient voiced understanding.  Also advised patient that Robaxin can cause drowsiness and itself.  Do not think that imaging of the back is necessary at this time given no apparent injury or direct spinal tenderness.  Discussed supportive care and alternating ice and heat  application.  Discussed strict return and ER precautions.  Suspect tachycardia is related to pain.  Patient verbalized understanding and was agreeable with plan. Final Clinical Impressions(s) / UC Diagnoses   Final diagnoses:  Acute right-sided low back pain without sciatica     Discharge Instructions      You have been prescribed a muscle relaxer.  Please go to the hospital if symptoms persist or worsen.    ED Prescriptions     Medication Sig Dispense Auth. Provider   methocarbamol (ROBAXIN) 500 MG tablet Take 1 tablet (500 mg total) by mouth 2 (two) times daily as needed for muscle spasms. 20 tablet [place], Acie Fredrickson, [place]      PDMP not reviewed this encounter.   Gustavus Bryant, [place] 04/16/21 2000

## 2021-04-16 NOTE — Discharge Instructions (Signed)
You have been prescribed a muscle relaxer.  Please go to the hospital if symptoms persist or worsen. ?

## 2021-04-17 ENCOUNTER — Encounter (INDEPENDENT_AMBULATORY_CARE_PROVIDER_SITE_OTHER): Payer: Self-pay | Admitting: Pediatric Endocrinology

## 2021-04-19 ENCOUNTER — Other Ambulatory Visit: Payer: Self-pay | Admitting: Pediatrics

## 2021-04-21 ENCOUNTER — Ambulatory Visit (INDEPENDENT_AMBULATORY_CARE_PROVIDER_SITE_OTHER): Payer: Medicaid Other

## 2021-04-21 ENCOUNTER — Telehealth (INDEPENDENT_AMBULATORY_CARE_PROVIDER_SITE_OTHER): Payer: Self-pay

## 2021-04-21 ENCOUNTER — Encounter (INDEPENDENT_AMBULATORY_CARE_PROVIDER_SITE_OTHER): Payer: Self-pay | Admitting: Pediatric Endocrinology

## 2021-04-21 ENCOUNTER — Other Ambulatory Visit: Payer: Self-pay

## 2021-04-21 ENCOUNTER — Encounter (INDEPENDENT_AMBULATORY_CARE_PROVIDER_SITE_OTHER): Payer: Self-pay

## 2021-04-21 DIAGNOSIS — E10649 Type 1 diabetes mellitus with hypoglycemia without coma: Secondary | ICD-10-CM | POA: Diagnosis not present

## 2021-04-21 LAB — POCT URINALYSIS DIPSTICK: Ketones, UA: NEGATIVE

## 2021-04-21 NOTE — Telephone Encounter (Signed)
Fax received by covermymeds to initiate PA for Dexcom Sensors: ? ? ?

## 2021-04-21 NOTE — Progress Notes (Addendum)
To Check for urine ketones to see if patient could return to work.  Ketones checked and note provided by front office staff.   ? ?I have reviewed the following documentation and I am in agreement.  I was immediately available to the nurse for questions and collaboration. ? ?[name], MD ? ? ?

## 2021-04-21 NOTE — Telephone Encounter (Signed)
APPROVED THRU 04/21/2022: ? ? ?

## 2021-05-02 ENCOUNTER — Telehealth (INDEPENDENT_AMBULATORY_CARE_PROVIDER_SITE_OTHER): Payer: Self-pay

## 2021-05-02 ENCOUNTER — Encounter (INDEPENDENT_AMBULATORY_CARE_PROVIDER_SITE_OTHER): Payer: Self-pay | Admitting: Pediatric Endocrinology

## 2021-05-02 ENCOUNTER — Other Ambulatory Visit (INDEPENDENT_AMBULATORY_CARE_PROVIDER_SITE_OTHER): Payer: Self-pay | Admitting: Pharmacist

## 2021-05-02 ENCOUNTER — Other Ambulatory Visit (INDEPENDENT_AMBULATORY_CARE_PROVIDER_SITE_OTHER): Payer: Self-pay | Admitting: Pediatric Endocrinology

## 2021-05-02 DIAGNOSIS — E10649 Type 1 diabetes mellitus with hypoglycemia without coma: Secondary | ICD-10-CM

## 2021-05-02 MED ORDER — INSULIN LISPRO (0.5 UNIT DIAL) 100 UNIT/ML (KWIKPEN JR): PEN_INJECTOR | SUBCUTANEOUS | 11 refills | Status: DC

## 2021-05-02 MED ORDER — LANTUS SOLOSTAR 100 UNIT/ML ~~LOC~~ SOPN: PEN_INJECTOR | SUBCUTANEOUS | 11 refills | Status: DC

## 2021-05-02 NOTE — Telephone Encounter (Signed)
Per RX request for pt, pharmacy stated insuline glargine needed a PA. Initiated thru covermymeds on an expedited status: ? ? ?

## 2021-05-05 ENCOUNTER — Telehealth (INDEPENDENT_AMBULATORY_CARE_PROVIDER_SITE_OTHER): Payer: Self-pay

## 2021-05-05 NOTE — Telephone Encounter (Addendum)
Fax and covermymeds notification of: PA NOT NEEDED   ? ? ?Called and spoke to pharmacist at [facility] and they stated that they have prescription ready for pickup ? ? ?Because: The product does not require a prior authorization. The pharmacy must enter the appropriate Clarification Code to override the rejection.  ?The rejections may include therapeutic duplications, drug-drug interactions, and/or high dose alerts. These rejections may also take the member's recent prescription history into consideration. The pharmacy processing the claim is able to override these rejection(s) for this request. The pharmacy may obtain assistance by contacting the [facility] Help Desk at [phone]. [facility] pharmacists, please call [phone] ? ? ? ?

## 2021-05-05 NOTE — Telephone Encounter (Signed)
Pt requested refill of [employer] 90 infusion set. I placed this order thru [employer]. It will be routed to Dr Vanessa [name] for her signature for processing.  ? ? ?

## 2021-05-05 NOTE — Telephone Encounter (Signed)
Done

## 2021-05-06 ENCOUNTER — Other Ambulatory Visit (INDEPENDENT_AMBULATORY_CARE_PROVIDER_SITE_OTHER): Payer: Self-pay | Admitting: Pediatric Endocrinology

## 2021-05-06 DIAGNOSIS — E1065 Type 1 diabetes mellitus with hyperglycemia: Secondary | ICD-10-CM

## 2021-05-28 ENCOUNTER — Encounter (INDEPENDENT_AMBULATORY_CARE_PROVIDER_SITE_OTHER): Payer: Self-pay | Admitting: Pediatric Endocrinology

## 2021-06-03 ENCOUNTER — Telehealth (INDEPENDENT_AMBULATORY_CARE_PROVIDER_SITE_OTHER): Payer: Self-pay | Admitting: Pediatric Endocrinology

## 2021-06-03 NOTE — Telephone Encounter (Signed)
?  Name of who is calling:Matilyn  ? ?Caller's Relationship to Patient: ? ?Best contact number:[phone] ? ?Provider they see: ?Dr. Vanessa [place]  ?Reason for call: ? ?Patient stated she doesn't have an appointment with referral until 12/21/2021 and she wants to know if she should still keep her appointment with dr. Vanessa [place] on the 16th. ? ? ?PRESCRIPTION REFILL ONLY ? ?Name of prescription: ? ?Pharmacy: ? ? ?

## 2021-06-17 ENCOUNTER — Encounter (INDEPENDENT_AMBULATORY_CARE_PROVIDER_SITE_OTHER): Payer: Self-pay | Admitting: Pediatric Endocrinology

## 2021-06-17 ENCOUNTER — Ambulatory Visit (INDEPENDENT_AMBULATORY_CARE_PROVIDER_SITE_OTHER): Payer: Medicaid Other | Admitting: Pediatric Endocrinology

## 2021-06-17 VITALS — BP 120/70 | HR 100 | Ht 60.43 in | Wt 170.6 lb

## 2021-06-17 DIAGNOSIS — E10649 Type 1 diabetes mellitus with hypoglycemia without coma: Secondary | ICD-10-CM | POA: Diagnosis not present

## 2021-06-17 DIAGNOSIS — E892 Postprocedural hypoparathyroidism: Secondary | ICD-10-CM | POA: Diagnosis not present

## 2021-06-17 LAB — POCT GLUCOSE (DEVICE FOR HOME USE): POC Glucose: 210 mg/dl — AB (ref 70–99)

## 2021-06-17 LAB — POCT GLYCOSYLATED HEMOGLOBIN (HGB A1C): HbA1c POC (<> result, manual entry): 8.1 % (ref 4.0–5.6)

## 2021-06-17 NOTE — Patient Instructions (Signed)
?  Time Basal Rate Correction factor Carb ratio Target BG   ?12a 1.35-> 1.25 30 mg/dL 10 681 mg/dL  ?4a 1.2 30 mg/dL 10 157 mg/dL  ?6a 1.25 30 mg/dL 10 262 mg/dL  ?8a 1.2 30 mg/dL 10 035 mg/dL  ?12PM 1.2 30 mg/dL 8 597 mg/dL  ?3pm 1.25 30mg /dL 8->7 110mg /dL  ?9p 1.35-> 1.4 30 mg/dL 8->7 mg/dL  ?  Total Basal  ? 30.25-> 30        ? ?Pump failure ?Lantus 30 units ?Carbs 1:10 ?Correction: 1 for every 30 points over 120.  ? ?

## 2021-06-17 NOTE — Progress Notes (Signed)
Subjective:  ?Subjective  ?Patient Name: Cassandra [name] Date of Birth: 2000/10/29  MRN: [id number] ? ?Cassandra [name]  presents to the office today for follow up evaluation and management of her type 1 diabetes and hypoglycemia, hypothyroidism, hypoparathyroidism ? ?HISTORY OF PRESENT ILLNESS:  ? ?Cassandra [name] is a 21 y.o. Caucasian female  ? ?[name] came to her appointment alone.  ? ?1. Cassandra [name] was seen in the hospital at [facility] pediatrics on October 12-15. She was admitted with hypoglycemia. She had previously been followed for her diabetes care at [facility] was diagnosed with type 1 diabetes at age 19. She was having leg cramps and she was having polyuria/polydipsia. Her BG at the PCP office was 564 mg/dL. [facility] was [facility]. She was admitted there for 3 days for initial diabetes education. She transitioned care to [facility] in 2018. She started on a T-Slim insulin pump 12/09/17.  ? ?2. Charley was last seen in pediatric endocrine clinic on 03/18/21 ? ?She is scheduled at [facility] in November.  ? ?She has taken a new job at [employer] and is much happier working there.  ? ?Post surgical hypoparathyroidism  ? ?She feels that overall she is doing well. She has not been having any muscle twitching.  ? ?1) Calcium 4 tabs 3 x daily  This is 6 grams per day. (OsCal 500/200). Need to take with food (12 tablets per day) - she has them in a pill sorter. Her mom is still helping to keep the pill sorter filled and remind her to take her doses. She also has a calcium liquid that she is taking once a day in addition to her pills:  Smart A-Z Minerals Calcium Ionic Mineral Dietary Supplement. 65 mg of calcium per 5 mL.  ?   ?2) Calcitriol 3 tabs with breakfast and 2 tabs with dinner  ?  ?3) HCTZ 50 mg twice a day ?  ?4) She has been getting 1000 iu of vit D3 (in addition to the 200 IU/tab of [place]).   ? ?Appetite is "the same". Diarrhea is "the same (bad)". She has been diagnosed with IBD and follows with adult GI.  She says that the diarrhea comes and goes. She has actually had some episodes of constipation which is unusual for her.  ? 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 She reports that it has been ~2 year since her last visit and she needs to go back and see him. She still needs to schedule.  ? ?She is having ongoing issues with her teeth. She is using prescription toothpaste from her dentist to help with the decalcification of her teeth. She went to the dentist 2 weeks ago. She has another 4 cavities and more prescription toothpaste. She has decalcification of her gums and teeth.  ? ?She is getting some calve cramps with walking at work.  ? ?Thyroid/ post surgical hypothyroidism  ? ?Currently on 150 mcg LT4 daily.   ? ?Reports that she feels good on her current dose.  ?Energy level is "about the same" ?She is not as cold as she was previously ?Hair has continued to shed some- consistent ?Some diarrhea/constipation ?Periods are monthly- sometimes twice monthly (every 2 weeks). She went back to her OB. She no longer has ovarian cysts. She is not taking an OCP.  ? ?Type 1 diabetes, with hyperglycemia/hypoglycemia, uncontrolled  ? ?She is wearing a Tandem T-Slim pump in Control IQ with Dexcom.  ? ?She has continued to  have some early morning hypoglycemia when she has to wake up early. She doesn't seem to have hypoglycemia if she wakes later in the day.  ? ? ?3. Pertinent Review of Systems:  ?Constitutional: The patient feels "good". The patient seems healthy and active.  ?Eyes: Vision seems to be good. There are no recognized eye problems. Wears glasses- got them fixed- they are at home. Saw Ophthalmology November 2022. Going back for glaucoma/visual field testing.  ?Neck: The patient has no complaints of anterior neck swelling, soreness, tenderness, pressure, discomfort, or difficulty swallowing.   ?Heart: Heart rate increases with exercise or other physical activity. The  patient has no complaints of palpitations, irregular heart beats, chest pain, or chest pressure.   ?Lungs: no asthma or wheezing.  ?Gastrointestinal: IBD followed by adult GI ?Legs: Muscle mass and strength seem normal. There are no complaints of numbness, tingling, burning, or pain. No edema is noted.  ?Feet: There are no obvious foot problems. There are no complaints of numbness, tingling, burning, or pain. No edema is noted. ?Neurologic: There are no recognized problems with muscle movement and strength, sensation, or coordination. Neuropathy in both feet. Sensation loss in right foot.  Intermittent pain in hands and feet. They did refer to neuro psych for her memory loss. She was diagnosed with short term memory loss. She is not currently taking any neurontin.  ?GYN/GU:  Has had menorrhagia on Nexplanon and on Depo Provera. Currently not on any birth control.  ?Diabetes alert: Tattoo right wrist.  ? ?Annual labs: [name] Last done Feb 2023- no new issues   ? ? ? ?CGM Download:_   ? ? ? ? ? ?Tandem: She is bolusing appropriately and her Control IQ is also helping.  ? ? ? ? ? ? ? ?    ?PAST MEDICAL, FAMILY, AND SOCIAL HISTORY ? ?Past Medical History:  ?Diagnosis Date  ? 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 Complication of anesthesia   ? woke up during Colonoscopy and EGD  ? Dyslipidemia 04/08/2014  ? Dysrhythmia   ? Tachy  ? Family history of adverse reaction to anesthesia   ? Mom woke up during surgery.  ? Generalized anxiety disorder 12/14/2016  ? GERD (gastroesophageal reflux disease)   ? Hashimoto's disease 01/2019  ? Hypertension   ? Hypocalcemia 02/07/2019  ? Hypoglycemia 11/14/2016  ? Hypokalemia   ? Hypomagnesemia   ? Insomnia 02/17/2017  ? Major depressive disorder   ? Migraine syndrome 03/17/2017  ? MRSA cellulitis   ? Neuropathy   ? Feet, legs, hands  ? 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 Post-surgical  hypothyroidism 01/25/2019  ? S/P thyroidectomy 01/27/2019  ? Symptomatic mammary hypertrophy 03/17/2017  ? Thyroid goiter 05/09/2018  ? 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 ? ?Family History  ?Problem Relation Age of Onset  ? Hypertension Maternal Grandmother   ? Anxiety disorder Maternal Grandmother   ? ADD / ADHD Maternal Grandmother   ? Diabetes Maternal Grandmother   ? Hyperlipidemia Maternal Grandfather   ? Hypothyroidism Maternal Grandfather   ? Cancer Maternal Grandfather   ?     blood cancer (unknown name)  ? Hyperlipidemia Mother   ? Anxiety disorder Mother   ? Hyperlipidemia Other   ? Migraines Neg Hx   ? Seizures Neg Hx   ? Depression Neg Hx   ? Autism Neg Hx   ? Bipolar disorder Neg Hx   ?  Schizophrenia Neg Hx   ? ? ? ?Current Outpatient Medications:  ?  amitriptyline (ELAVIL) 75 MG tablet, Take 1 tablet (75 mg total) by mouth at bedtime., Disp: 90 tablet, Rfl: 1 ?  buPROPion (WELLBUTRIN XL) 150 MG 24 hr tablet, TAKE 1 TABLET BY MOUTH EVERY DAY IN THE MORNING, Disp: 90 tablet, Rfl: 1 ?  calcitRIOL (ROCALTROL) 0.25 MCG capsule, Take 3 capsules (0.75 mcg) am and 2 capsules (0.5 mcg) pm, Disp: 270 capsule, Rfl: 5 ?  CALCIUM PO, Take 6 tablets by mouth in the morning and at bedtime., Disp: , Rfl:  ?  Continuous Blood Gluc Sensor (DEXCOM G6 SENSOR) MISC, CHANGE SENSOR EVERY 10 DAYS AS DIRECTED., Disp: 3 each, Rfl: 5 ?  Continuous Blood Gluc Transmit (DEXCOM G6 TRANSMITTER) MISC, 1 kit by Does not apply route daily as needed., Disp: 1 each, Rfl: 1 ?  fluticasone (FLONASE) 50 MCG/ACT nasal spray, Place into the nose., Disp: , Rfl:  ?  hydrochlorothiazide (HYDRODIURIL) 25 MG tablet, Take 25 mg by mouth 2 (two) times daily., Disp: , Rfl:  ?  insulin glargine (LANTUS SOLOSTAR) 100 UNIT/ML Solostar Pen, Inject up to 50 units daily per provider instructions. Please use in case insulin pump fails., Disp: 15 mL, Rfl: 11 ?  Insulin lispro (HUMALOG JUNIOR KWIKPEN) 100 UNIT/ML, Inject up to 50  units daily per provider instructions. Please use in case insulin pump fails., Disp: 15 mL, Rfl: 11 ?  insulin lispro (HUMALOG) 100 UNIT/ML injection, INJECT UP TO 300 UNITS IN INSULIN PUMP EVERY 48 HOURS, PER DKA AND HY

## 2021-06-18 ENCOUNTER — Encounter (INDEPENDENT_AMBULATORY_CARE_PROVIDER_SITE_OTHER): Payer: Self-pay | Admitting: Pediatric Endocrinology

## 2021-06-21 ENCOUNTER — Encounter (INDEPENDENT_AMBULATORY_CARE_PROVIDER_SITE_OTHER): Payer: Self-pay | Admitting: Pediatric Endocrinology

## 2021-06-21 LAB — COMPREHENSIVE METABOLIC PANEL
AG Ratio: 1.4 (calc) (ref 1.0–2.5)
ALT: 12 U/L (ref 6–29)
AST: 13 U/L (ref 10–30)
Albumin: 4.4 g/dL (ref 3.6–5.1)
Alkaline phosphatase (APISO): 82 U/L (ref 31–125)
BUN/Creatinine Ratio: 11 (calc) (ref 6–22)
BUN: 11 mg/dL (ref 7–25)
CO2: 25 mmol/L (ref 20–32)
Calcium: 9.1 mg/dL (ref 8.6–10.2)
Chloride: 103 mmol/L (ref 98–110)
Creat: 0.98 mg/dL — ABNORMAL HIGH (ref 0.50–0.96)
Globulin: 3.2 g/dL (calc) (ref 1.9–3.7)
Glucose, Bld: 145 mg/dL — ABNORMAL HIGH (ref 65–139)
Potassium: 4 mmol/L (ref 3.5–5.3)
Sodium: 138 mmol/L (ref 135–146)
Total Bilirubin: 0.3 mg/dL (ref 0.2–1.2)
Total Protein: 7.6 g/dL (ref 6.1–8.1)

## 2021-06-21 LAB — VITAMIN D 1,25 DIHYDROXY
Vitamin D 1, 25 (OH)2 Total: 9 pg/mL — ABNORMAL LOW (ref 18–72)
Vitamin D2 1, 25 (OH)2: <8 pg/mL
Vitamin D3 1, 25 (OH)2: 9 pg/mL

## 2021-06-21 LAB — PHOSPHORUS: Phosphorus: 5.8 mg/dL — ABNORMAL HIGH (ref 2.5–4.5)

## 2021-06-23 ENCOUNTER — Other Ambulatory Visit (INDEPENDENT_AMBULATORY_CARE_PROVIDER_SITE_OTHER): Payer: Self-pay | Admitting: Pediatric Endocrinology

## 2021-06-23 DIAGNOSIS — E559 Vitamin D deficiency, unspecified: Secondary | ICD-10-CM

## 2021-06-23 DIAGNOSIS — E892 Postprocedural hypoparathyroidism: Secondary | ICD-10-CM

## 2021-06-24 NOTE — Progress Notes (Unsigned)
GYNECOLOGY OFFICE VISIT NOTE  History:   Cassandra [name] is a 21 y.o. G0P0000 here today for pelvic pain in the setting of clinically diagnosed endometriosis.  For control of her symptoms she has tried OCPs in the past for a couple months and has since developed contraindications to them (poorly controlled T1DM, HTN, dyslipidemia, BMI >30, migraines to name the immediately important ones). She did the Nexplanon for just over a year but did not feel like it helped the pain with her periods. She then did Depo provera for about 1 year. She stopped getting Depo in 2020.   ***   She denies any abnormal vaginal discharge, bleeding, or other concerns.     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/25/2019   S/P thyroidectomy 01/27/2019   Symptomatic mammary hypertrophy 03/17/2017   Thyroid goiter 05/09/2018   Type 1 diabetes, uncontrolled, 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    Past Surgical History:  Procedure Laterality Date   ADENOIDECTOMY     COLONOSCOPY     ESOPHAGOGASTRODUODENOSCOPY     THYROIDECTOMY N/A 01/25/2019   Procedure: COMPLETE THYROIDECTOMY;  Surgeon: Newman Pieseoh, Su, MD;  Location: [facility] OR;  Service: ENT;  Laterality: N/A;   TOE  SURGERY Bilateral    for ingrown toenails   TYMPANOSTOMY TUBE PLACEMENT     WISDOM TOOTH EXTRACTION      The following portions of the patient's history were reviewed and updated as appropriate: allergies, current medications, past family history, past medical history, past social history, past surgical history and problem list.   Health Maintenance:   She turned 21 about 2 months ago and has not yet had a pap smear. She has had Gardasil vaccination.   Review of Systems:  Pertinent items noted in HPI and remainder of comprehensive ROS otherwise negative.  Physical Exam:  There were no vitals taken for this visit. CONSTITUTIONAL: Well-developed, well-nourished female in no acute distress.  HEENT:  Normocephalic, atraumatic. External right and left ear normal. No scleral icterus.  NECK: Normal range of motion, supple, no masses noted on observation SKIN: No rash noted. Not diaphoretic. No erythema. No pallor. MUSCULOSKELETAL: Normal range of motion. No edema noted. NEUROLOGIC: Alert and oriented to person, place, and time. Normal muscle tone coordination. No cranial nerve deficit noted. PSYCHIATRIC: Normal mood and affect. Normal behavior. Normal judgment and thought content.  CARDIOVASCULAR: Normal heart rate noted RESPIRATORY: Effort and breath sounds normal, no problems with respiration noted ABDOMEN: No masses noted. No other overt distention noted.    PELVIC: {Blank single:19197::"Deferred","Normal appearing external genitalia; normal urethral meatus; normal appearing vaginal mucosa and cervix.  No abnormal discharge noted.  Normal uterine size, no other palpable masses, no uterine or  adnexal tenderness. Performed in the presence of a chaperone"}  Labs and Imaging Results for orders placed or performed in visit on 06/17/21 (from the past 168 hour(s))  POCT Glucose (Device for Home Use)   Collection Time: 06/17/21  1:49 PM  Result Value Ref Range   Glucose Fasting, POC     POC  Glucose 210 (A) 70 - 99 mg/dl  POCT glycosylated hemoglobin (Hb A1C)   Collection Time: 06/17/21  1:54 PM  Result Value Ref Range   Hemoglobin A1C     HbA1c POC (<> result, manual entry) 8.1 4.0 - 5.6 %   HbA1c, POC (prediabetic range)     HbA1c, POC (controlled diabetic range)    Comprehensive metabolic panel   Collection Time: 06/17/21  2:44 PM  Result Value Ref Range   Glucose, Bld 145 (H) 65 - 139 mg/dL   BUN 11 7 - 25 mg/dL   Creat 3.08 (H) 6.57 - 0.96 mg/dL   BUN/Creatinine Ratio 11 6 - 22 (calc)   Sodium 138 135 - 146 mmol/L   Potassium 4.0 3.5 - 5.3 mmol/L   Chloride 103 98 - 110 mmol/L   CO2 25 20 - 32 mmol/L   Calcium 9.1 8.6 - 10.2 mg/dL   Total Protein 7.6 6.1 - 8.1 g/dL   Albumin 4.4 3.6 - 5.1 g/dL   Globulin 3.2 1.9 - 3.7 g/dL (calc)   AG Ratio 1.4 1.0 - 2.5 (calc)   Total Bilirubin 0.3 0.2 - 1.2 mg/dL   Alkaline phosphatase (APISO) 82 31 - 125 U/L   AST 13 10 - 30 U/L   ALT 12 6 - 29 U/L  Vitamin D 1,25 dihydroxy   Collection Time: 06/17/21  2:44 PM  Result Value Ref Range   Vitamin D 1, 25 (OH)2 Total 9 (L) 18 - 72 pg/mL   Vitamin D3 1, 25 (OH)2 9 pg/mL   Vitamin D2 1, 25 (OH)2 <8 pg/mL  Phosphorus   Collection Time: 06/17/21  2:44 PM  Result Value Ref Range   Phosphorus 5.8 (H) 2.5 - 4.5 mg/dL   No results found.  Assessment and Plan:   1. Pelvic pain - Discussed differential: i.e. secondary dysmenorrhea vs endometriosis vs other etiology. Discussed she has other sources for pain in the pelvis/abdomen as well I.e. her IBS and ensuring follow up with her GI to ensure this is under control. Constipation is also an independent cause at times for extreme pelvic pain even in those with daily bowel movements (they can be so backed up that daily bowel movements still persist).  - Discussed only way to tell definitively regarding endometriosis is either by US showing an endometrioma or by laparoscopy. Would advise against surgery unless other treatment options have  failed or concerns regarding infertility. We removed the impact on [facility] on personal risk of endometriosis.  - Discussed treatment options: Motrin prn severe cramps along with tylenol, POPs, Nuvaring, Depo, Nexplanon, Lng-IUD and other hormonal options i.e. Pollie Friar.  We discussed the role of PFPT particularly given her pelvic pain. There is not a primary role for surgery in this instance unless she has endometriomas. We will check an [place] for this. *** - We also discussed possible prevention of progression of endometriosis with hormonal suppression - We reviewed use of laparoscopy in the assistance of fertility if endometriosis but that would only be after she has tried for at least 6 months.  - At this time, she would like to do: {Blank mulitple:19196::"Hormonal therapy with ***","  Non-hormonal therapy with ***","PFPT","Surgical intervention","Expectant management"}   2. Endometriosis - Check TVUS for possible endometriomas, otherwise see above ***   Diagnoses and all orders for this visit:  Pelvic pain  Endometriosis    Routine preventative health maintenance measures emphasized. Please refer to After Visit Summary for other counseling recommendations.   No follow-ups on file.  Milas Hock, MD, FACOG Obstetrician & Gynecologist, [facility] for [facility], [facility]

## 2021-06-25 ENCOUNTER — Encounter (INDEPENDENT_AMBULATORY_CARE_PROVIDER_SITE_OTHER): Payer: Self-pay | Admitting: Pediatric Endocrinology

## 2021-06-26 ENCOUNTER — Encounter: Payer: Self-pay | Admitting: Obstetrics and Gynecology

## 2021-06-26 ENCOUNTER — Ambulatory Visit: Payer: Medicaid Other | Admitting: Obstetrics and Gynecology

## 2021-06-26 VITALS — BP 134/84 | HR 102 | Ht 60.0 in | Wt 171.0 lb

## 2021-06-26 DIAGNOSIS — R102 Pelvic and perineal pain: Secondary | ICD-10-CM

## 2021-06-26 DIAGNOSIS — N809 Endometriosis, unspecified: Secondary | ICD-10-CM | POA: Diagnosis not present

## 2021-06-26 LAB — POCT URINE PREGNANCY: Preg Test, Ur: NEGATIVE

## 2021-06-27 ENCOUNTER — Encounter: Payer: Self-pay | Admitting: Obstetrics and Gynecology

## 2021-06-27 ENCOUNTER — Ambulatory Visit (INDEPENDENT_AMBULATORY_CARE_PROVIDER_SITE_OTHER): Payer: Medicaid Other

## 2021-06-27 DIAGNOSIS — R102 Pelvic and perineal pain: Secondary | ICD-10-CM | POA: Diagnosis not present

## 2021-06-27 LAB — HCG, QUANTITATIVE, PREGNANCY: HCG, Total, QN: <5 m[IU]/mL

## 2021-06-28 LAB — VITAMIN D 25 HYDROXY (VIT D DEFICIENCY, FRACTURES): Vit D, 25-Hydroxy: 29 ng/mL — ABNORMAL LOW (ref 30–100)

## 2021-06-28 LAB — VITAMIN D 1,25 DIHYDROXY
Vitamin D 1, 25 (OH)2 Total: 18 pg/mL (ref 18–72)
Vitamin D2 1, 25 (OH)2: <8 pg/mL
Vitamin D3 1, 25 (OH)2: 18 pg/mL

## 2021-07-07 ENCOUNTER — Encounter (INDEPENDENT_AMBULATORY_CARE_PROVIDER_SITE_OTHER): Payer: Self-pay | Admitting: Pediatric Endocrinology

## 2021-07-23 NOTE — Therapy (Deleted)
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/25/2019   S/P thyroidectomy 01/27/2019   Symptomatic mammary hypertrophy 03/17/2017   Thyroid goiter 05/09/2018   Type 1 diabetes, uncontrolled, 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   Past Surgical History:  Procedure Laterality Date   ADENOIDECTOMY     COLONOSCOPY     ESOPHAGOGASTRODUODENOSCOPY     THYROIDECTOMY N/A 01/25/2019   Procedure: COMPLETE THYROIDECTOMY;  Surgeon: Newman Pies, MD;  Location: [facility] OR;  Service: ENT;  Laterality: N/A;   TOE SURGERY Bilateral    for ingrown toenails   TYMPANOSTOMY TUBE PLACEMENT     WISDOM TOOTH EXTRACTION     Patient Active Problem List   Diagnosis Date Noted   Endometriosis 09/20/2020   Dysrhythmia 07/23/2020   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   Hypocalcemia 02/07/2019   S/P thyroidectomy 01/27/2019   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/25/2019   Hashimoto's disease 01/2019   Hypomagnesemia    Hypokalemia    Major depressive disorder    MRSA cellulitis    GERD (gastroesophageal  reflux disease)    Migraine syndrome 03/17/2017   Symptomatic mammary hypertrophy 03/17/2017   Insomnia 02/17/2017   Generalized anxiety disorder 12/14/2016   Type 1 diabetes, uncontrolled, 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  Rationale for Evaluation and Treatment Rehabilitation  ONSET DATE: ***  SUBJECTIVE:  SUBJECTIVE STATEMENT: *** Fluid intake: {Yes/No:304960894}    PAIN:  Are you having pain? {yes/no:20286} NPRS scale: ***/10 Pain location: {pelvic pain location:27098}  Pain type: {type:313116} Pain description: {PAIN DESCRIPTION:21022940}   Aggravating factors: *** Relieving factors: ***  PRECAUTIONS: {Therapy precautions:24002}  WEIGHT BEARING RESTRICTIONS {Yes ***/No:24003}  FALLS:  Has patient fallen in last 6 months? {fallsyesno:27318}  LIVING ENVIRONMENT: Lives with: {OP[facility] lives with:25569::"lives with their family"} Lives in: {Lives in:25570} Stairs: {opstairs:27293} Has following equipment at home: {Assistive devices:23999}  OCCUPATION: ***  PLOF: {PLOF:24004}  PATIENT GOALS ***  PERTINENT HISTORY:  GERD, autoimmune disease, anxiety/depression, endometriosis Sexual abuse: {Yes/No:304960894}  BOWEL MOVEMENT Pain with bowel movement: {yes/no:20286} Type of bowel movement:{PT BM type:27100} Fully empty rectum: {Yes/No:304960894} Leakage: {Yes/No:304960894} Pads: {Yes/No:304960894} Fiber supplement: {Yes/No:304960894}  URINATION Pain with urination:  {yes/no:20286} Fully empty bladder: {Yes/No:304960894} Stream: {PT urination:27102} Urgency: {Yes/No:304960894} Frequency: *** Leakage: {PT leakage:27103} Pads: {Yes/No:304960894}  INTERCOURSE Pain with intercourse: {pain with intercourse PA:27099} Ability to have vaginal penetration:  {Yes/No:304960894} Climax: *** Marinoff Scale: ***/3  PREGNANCY Vaginal deliveries *** Tearing {Yes***/No:304960894} C-section deliveries *** Currently pregnant {Yes***/No:304960894}  PROLAPSE {PT prolapse:27101}    OBJECTIVE:   DIAGNOSTIC FINDINGS:  ***  PATIENT SURVEYS:  {rehab surveys:24030}  PFIQ-7 ***  COGNITION:  Overall cognitive status: {cognition:24006}     SENSATION:  Light touch: {intact/deficits:24005}  Proprioception: {intact/deficits:24005}  MUSCLE LENGTH: Hamstrings: Right *** deg; Left *** deg Thomas test: Right *** deg; Left *** deg  LUMBAR SPECIAL TESTS:  {lumbar special test:25242}  FUNCTIONAL TESTS:  {Functional tests:24029}  GAIT: Distance walked: *** Assistive device utilized: {Assistive devices:23999} Level of assistance: {Levels of assistance:24026} Comments: ***               POSTURE: {posture:25561}   PELVIC ALIGNMENT:  LUMBARAROM/PROM  A/PROM A/PROM  eval  Flexion   Extension   Right lateral flexion   Left lateral flexion   Right rotation   Left rotation    (Blank rows = not tested)  LOWER EXTREMITY ROM:  {AROM/PROM:27142} ROM Right eval Left eval  Hip flexion    Hip extension    Hip abduction    Hip adduction    Hip internal rotation    Hip external rotation    Knee flexion    Knee extension    Ankle dorsiflexion    Ankle plantarflexion    Ankle inversion    Ankle eversion     (Blank rows = not tested)  LOWER EXTREMITY MMT:  MMT Right eval Left eval  Hip flexion    Hip extension    Hip abduction    Hip adduction    Hip internal rotation    Hip external rotation    Knee flexion    Knee extension    Ankle  dorsiflexion    Ankle plantarflexion    Ankle inversion    Ankle eversion      PALPATION:   General  ***                External Perineal Exam ***                             Internal Pelvic Floor ***  Patient confirms identification and approves PT to assess internal pelvic floor and treatment {yes/no:20286}  PELVIC MMT:   MMT eval  Vaginal   Internal Anal Sphincter   External Anal Sphincter   Puborectalis   Diastasis Recti   (Blank rows = not tested)  TONE: ***  PROLAPSE: ***  TODAY'S TREATMENT  EVAL ***   PATIENT EDUCATION:  Education details: *** Person educated: {Person educated:25204} Education method: {Education Method:25205} Education comprehension: {Education Comprehension:25206}   HOME EXERCISE PROGRAM: ***  ASSESSMENT:  CLINICAL IMPRESSION: Patient is a *** y.o. *** who was seen today for physical therapy evaluation and treatment for ***.    OBJECTIVE IMPAIRMENTS {opptimpairments:25111}.   ACTIVITY LIMITATIONS {activitylimitations:27494}  PARTICIPATION LIMITATIONS: {participationrestrictions:25113}  PERSONAL FACTORS {Personal factors:25162} are also affecting patient's functional outcome.   REHAB POTENTIAL: {rehabpotential:25112}  CLINICAL DECISION MAKING: {clinical decision making:25114}  EVALUATION COMPLEXITY: {Evaluation complexity:25115}   GOALS: Goals reviewed with patient? {yes/no:20286}  SHORT TERM GOALS: Target date: {follow up:25551}  *** Baseline: Goal status: {GOALSTATUS:25110}  2.  *** Baseline:  Goal status: {GOALSTATUS:25110}  3.  *** Baseline:  Goal status: {GOALSTATUS:25110}  4.  *** Baseline:  Goal status: {GOALSTATUS:25110}  5.  *** Baseline:  Goal status: {GOALSTATUS:25110}  6.  *** Baseline:  Goal status: {GOALSTATUS:25110}  LONG TERM GOALS: Target date: {follow up:25551}   *** Baseline:  Goal status: {GOALSTATUS:25110}  2.  *** Baseline:  Goal status: {GOALSTATUS:25110}  3.   *** Baseline:  Goal status: {GOALSTATUS:25110}  4.  *** Baseline:  Goal status: {GOALSTATUS:25110}  5.  *** Baseline:  Goal status: {GOALSTATUS:25110}  6.  *** Baseline:  Goal status: {GOALSTATUS:25110}  PLAN: PT FREQUENCY: {rehab frequency:25116}  PT DURATION: {rehab duration:25117}  PLANNED INTERVENTIONS: {rehab planned interventions:25118::"Therapeutic exercises","Therapeutic activity","Neuromuscular re-education","Balance training","Gait training","Patient/Family education","Joint mobilization"}  PLAN FOR NEXT SESSION: ***   Brayton Caves Dhillon [place], PT 07/23/2021, 5:21 PM

## 2021-07-24 ENCOUNTER — Ambulatory Visit: Payer: Medicaid Other | Admitting: Physical Therapy

## 2021-08-04 ENCOUNTER — Encounter (INDEPENDENT_AMBULATORY_CARE_PROVIDER_SITE_OTHER): Payer: Self-pay | Admitting: Pediatric Endocrinology

## 2021-08-06 ENCOUNTER — Ambulatory Visit: Payer: Medicaid Other | Attending: Obstetrics and Gynecology | Admitting: Physical Therapy

## 2021-08-07 MED ORDER — ADHESIVE REMOVER WIPES MISC
3 refills | Status: DC
Start: 2021-08-07 — End: 2021-10-07

## 2021-09-04 ENCOUNTER — Encounter (INDEPENDENT_AMBULATORY_CARE_PROVIDER_SITE_OTHER): Payer: Self-pay

## 2021-09-18 ENCOUNTER — Encounter (INDEPENDENT_AMBULATORY_CARE_PROVIDER_SITE_OTHER): Payer: Self-pay | Admitting: Pediatric Endocrinology

## 2021-09-18 MED ORDER — FLUCONAZOLE 150 MG PO TABS: 150.0000 mg | ORAL_TABLET | Freq: Every day | ORAL | 1 refills | Status: DC

## 2021-09-22 ENCOUNTER — Ambulatory Visit (INDEPENDENT_AMBULATORY_CARE_PROVIDER_SITE_OTHER): Payer: Medicaid Other | Admitting: Pediatric Endocrinology

## 2021-10-07 ENCOUNTER — Encounter (INDEPENDENT_AMBULATORY_CARE_PROVIDER_SITE_OTHER): Payer: Self-pay | Admitting: Pediatric Endocrinology

## 2021-10-07 ENCOUNTER — Other Ambulatory Visit: Payer: Self-pay | Admitting: Family

## 2021-10-07 ENCOUNTER — Ambulatory Visit (INDEPENDENT_AMBULATORY_CARE_PROVIDER_SITE_OTHER): Payer: Medicaid Other | Admitting: Pediatric Endocrinology

## 2021-10-07 VITALS — BP 112/70 | HR 96 | Wt 173.0 lb

## 2021-10-07 DIAGNOSIS — E162 Hypoglycemia, unspecified: Secondary | ICD-10-CM

## 2021-10-07 DIAGNOSIS — E10649 Type 1 diabetes mellitus with hypoglycemia without coma: Secondary | ICD-10-CM

## 2021-10-07 DIAGNOSIS — E890 Postprocedural hypothyroidism: Secondary | ICD-10-CM

## 2021-10-07 DIAGNOSIS — E892 Postprocedural hypoparathyroidism: Secondary | ICD-10-CM | POA: Diagnosis not present

## 2021-10-07 DIAGNOSIS — E1065 Type 1 diabetes mellitus with hyperglycemia: Secondary | ICD-10-CM | POA: Diagnosis not present

## 2021-10-07 DIAGNOSIS — F4323 Adjustment disorder with mixed anxiety and depressed mood: Secondary | ICD-10-CM

## 2021-10-07 LAB — POCT GLUCOSE (DEVICE FOR HOME USE): POC Glucose: 247 mg/dl — AB (ref 70–99)

## 2021-10-07 MED ORDER — HYDROXYZINE HCL 10 MG PO TABS: 10.0000 mg | ORAL_TABLET | Freq: Three times a day (TID) | ORAL | 3 refills | Status: DC | PRN

## 2021-10-07 MED ORDER — ADHESIVE REMOVER WIPES MISC: 3 refills | Status: AC

## 2021-10-07 MED ORDER — BAQSIMI TWO PACK 3 MG/DOSE NA POWD: 1.0000 | NASAL | 3 refills | Status: AC | PRN

## 2021-10-07 NOTE — Progress Notes (Signed)
Subjective:  Subjective  Patient Name: Cassandra [name] Date of Birth: 2000-06-26  MRN: [id number]  Cassandra [name]  presents to the office today for follow up evaluation and management of her type 1 diabetes and hypoglycemia, hypothyroidism, hypoparathyroidism  HISTORY OF PRESENT ILLNESS:   Cassandra [name] is a 21 y.o. Caucasian female   [place] came to her appointment alone.   1. Cassandra [name] was seen in the hospital at [facility] pediatrics on October 12-15. She was admitted with hypoglycemia. She had previously been followed for her diabetes care at [place] was diagnosed with type 1 diabetes at age 43. She was having leg cramps and she was having polyuria/polydipsia. Her BG at the PCP office was 564 mg/dL. She was sent to [facility]. She was admitted there for 3 days for initial diabetes education. She transitioned care to [facility] in 2018. She started on a T-Slim insulin pump 12/09/17.   2. Cassandra [name] was last seen in pediatric endocrine clinic on 06/17/21  She is scheduled at [facility] in November.   She is no longer working at [employer]. She was doing inpatient patient access work. She hated seeing people admitted with issues that she has and then not having good outcomes. She found that it really increased her anxiety. She is currently working doing Development worker, community. She would prefer to be working in a medical office setting.   She has had ongoing dental issues due to de-calcifications and a tooth cracking.   She has recently been diagnosed with gall stones. She is meeting with a Psychologist, sport and exercise on Friday.   She has had an increase in her depression. She was not able to reach [place] as [place] no longer works for [employer]. She did not call the office at [facility] - but she feels that her medication needs to be tweaked.   She also contacted me for Fluconazole for a yeast infection.    Post surgical hypoparathyroidism   She feels that overall she is doing well. She has not been having any muscle  twitching.   1) Calcium 4 tabs 3 x daily  This is 6 grams per day. (OsCal 500/200). Need to take with food (12 tablets per day) - she has them in a pill sorter. Her mom is still helping to keep the pill sorter filled and remind her to take her doses. She also has a calcium liquid that she is taking once a day in addition to her pills:  Smart A-Z Minerals Calcium Ionic Mineral Dietary Supplement. 65 mg of calcium per 5 mL.     2) Calcitriol 3 tabs with breakfast and 2 tabs with dinner    3) HCTZ 50 mg twice a day   4) She has been getting 1000 iu of vit D3 (in addition to the 200 IU/tab of Oscal).    Appetite is "the same". Diarrhea is "the same (bad)". She now thinks that diarrhea is related to her gall bladder issues. She has been diagnosed with IBD and follows with adult GI. She says that the diarrhea comes and goes.   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  She last saw him in June 2023. He increased doses of her metoprolol   She is having ongoing issues with her teeth. She is using prescription toothpaste from her dentist to help with the decalcification of her teeth.  She has decalcification of her gums and teeth.   She is not having any cramping or muscle spasms.  Thyroid/ post surgical hypothyroidism   Currently on 150 mcg LT4 daily.    Reports that she feels good on her current dose.  Energy level is "about the same" She is not as cold as she was previously Hair has continued to shed some- consistent Periods are monthly- sometimes twice monthly (every 2 weeks). She went back to her OB. She no longer has ovarian cysts. She is not taking an OCP. No changes with this.   Type 1 diabetes, with hyperglycemia/hypoglycemia, uncontrolled   She is wearing a Tandem T-Slim pump in Control IQ with Dexcom.   She has continued to have some early morning hypoglycemia despite overall running higher than previously.  She is not having  hypoglycemia at other times a day.  She is in automode on her pump and is unsure why she is getting low.  She woke up low this morning. She had slept through the alarms in her system (see report below).   3. Pertinent Review of Systems:  Constitutional: The patient feels "good". The patient seems healthy and active.  Eyes: Vision seems to be good. There are no recognized eye problems. Wears glasses- got them fixed- they are at home. Saw Ophthalmology November 2022. Going back for glaucoma/visual field testing.  Neck: The patient has no complaints of anterior neck swelling, soreness, tenderness, pressure, discomfort, or difficulty swallowing.   Heart: Heart rate increases with exercise or other physical activity. The patient has no complaints of palpitations, irregular heart beats, chest pain, or chest pressure.   Lungs: no asthma or wheezing.  Gastrointestinal: IBD / gall bladder disease followed by adult GI Legs: Muscle mass and strength seem normal. There are no complaints of numbness, tingling, burning, or pain. No edema is noted.  Feet: There are no obvious foot problems. There are no complaints of numbness, tingling, burning, or pain. No edema is noted. Neurologic: There are no recognized problems with muscle movement and strength, sensation, or coordination. Neuropathy in both feet. Sensation loss in right foot.  Intermittent pain in hands and feet. They did refer to neuro psych for her memory loss. She was diagnosed with short term memory loss. She is not currently taking any neurontin.  GYN/GU:  Has had menorrhagia on Nexplanon and on Depo Provera. Currently not on any birth control.  Diabetes alert: Tattoo right wrist.   Annual labs: [name] Last done Feb 2023- no new issues     CGM Download:             Tandem:   She is bolusing appropriately and her Control IQ is also helping. She is getting ~ 1 u/kg /day  However, her sugar is highly variable and she continues to have  hypoglycemia.           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/25/2019   S/P thyroidectomy 01/27/2019   Symptomatic mammary hypertrophy 03/17/2017   Thyroid goiter 05/09/2018   Type 1 diabetes, uncontrolled, 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    Family History  Problem Relation Age of Onset   Hypertension Maternal Grandmother    Anxiety disorder Maternal Grandmother    ADD / ADHD Maternal Grandmother    Diabetes Maternal Grandmother    Hyperlipidemia Maternal Grandfather    Hypothyroidism Maternal Grandfather    Cancer Maternal Grandfather        blood cancer (unknown name)   Hyperlipidemia Mother    Anxiety disorder Mother    Hyperlipidemia Other    Migraines Neg Hx    Seizures Neg Hx    Depression Neg Hx    Autism Neg Hx    Bipolar disorder Neg Hx    Schizophrenia Neg Hx      Current Outpatient Medications:    amitriptyline (ELAVIL) 75 MG tablet, Take 1 tablet (75 mg total) by mouth at bedtime., Disp: 90 tablet, Rfl: 1   buPROPion (WELLBUTRIN XL) 300 MG 24 hr tablet, Take by mouth., Disp: , Rfl:    calcitRIOL (ROCALTROL) 0.25 MCG capsule, Take 3 capsules (0.75 mcg) am and 2 capsules  (0.5 mcg) pm, Disp: 270 capsule, Rfl: 5   calcium acetate (PHOSLO) 667 MG capsule, Take by mouth., Disp: , Rfl:    CALCIUM PO, Take 6 tablets by mouth in the morning and at bedtime., Disp: , Rfl:    cholestyramine (QUESTRAN) 4 g packet, Take 1 packet by mouth daily., Disp: , Rfl:    Continuous Blood Gluc Sensor (DEXCOM G6 SENSOR) MISC, CHANGE SENSOR EVERY 10 DAYS AS DIRECTED., Disp: 3 each, Rfl: 5   Continuous Blood Gluc Transmit (DEXCOM G6 TRANSMITTER) MISC, 1 kit by Does not apply route daily as needed., Disp: 1 each, Rfl: 1   DENTA 5000 PLUS 1.1 % CREA dental cream, Take by mouth as directed., Disp: , Rfl:    fluconazole (DIFLUCAN) 150 MG tablet, Take 1 tablet (150 mg total) by mouth daily. Take 1 tab. Repeat after 3 days if symptoms persist., Disp: 3 tablet, Rfl: 1   fluticasone (FLONASE) 50 MCG/ACT nasal spray, Place into the nose., Disp: , Rfl:    Glucagon (BAQSIMI TWO PACK) 3 MG/DOSE POWD, Place 1 each into the nose as needed (severe hypoglycmia with unresponsiveness)., Disp: 1 each, Rfl: 3   hydrochlorothiazide (HYDRODIURIL) 25 MG tablet, Take 25 mg by mouth 2 (two) times daily., Disp: , Rfl:    hydrOXYzine (ATARAX) 10 MG tablet, Take 1 tablet (10 mg total) by mouth 3 (three) times daily as needed., Disp: 90 tablet, Rfl: 3   insulin lispro (HUMALOG) 100 UNIT/ML injection, INJECT UP TO 300 UNITS IN INSULIN PUMP EVERY 48 HOURS, PER DKA AND HYPERGLYCEMIA PROTOCOLS, Disp: 40 mL, Rfl: 5   levocetirizine (XYZAL) 5 MG tablet, Take by mouth., Disp: , Rfl:    levothyroxine (SYNTHROID) 150 MCG tablet, TAKE 1 TABLET BY MOUTH EVERY DAY, Disp: 90 tablet, Rfl: 3   metoprolol tartrate (LOPRESSOR) 50 MG tablet, Take 50 mg by mouth 2 (two) times daily., Disp: , Rfl:    naproxen sodium (ALEVE) 220 MG tablet, Take by mouth., Disp: , Rfl:    nystatin (MYCOSTATIN/NYSTOP) powder, Apply topically., Disp: , Rfl:    pantoprazole (PROTONIX) 20 MG tablet, Take 20 mg by mouth  daily., Disp: , Rfl:    Vitamin D3  (VITAMIN D) 25 MCG tablet, Take 1 tablet (1,000 Units total) by mouth daily., Disp: 60 tablet, Rfl: 0   aspirin-acetaminophen-caffeine (EXCEDRIN MIGRAINE) 250-250-65 MG tablet, Take by mouth. (Patient not taking: Reported on 10/07/2021), Disp: , Rfl:    Blood Glucose Monitoring Suppl (ACCU-CHEK GUIDE) w/Device KIT, 1 kit by Does not apply route daily as needed. (Patient not taking: Reported on 03/18/2021), Disp: 2 kit, Rfl: 5   Continuous Blood Gluc Receiver (DEXCOM G6 RECEIVER) DEVI, 1 Device by Does not apply route daily as needed. (Patient not taking: Reported on 10/07/2021), Disp: 1 each, Rfl: 0   insulin degludec (TRESIBA) 200 UNIT/ML FlexTouch Pen, Inject into the skin. (Patient not taking: Reported on 03/18/2021), Disp: , Rfl:    Insulin lispro (HUMALOG JUNIOR KWIKPEN) 100 UNIT/ML, Inject up to 50 units daily per provider instructions. Please use in case insulin pump fails. (Patient not taking: Reported on 10/07/2021), Disp: 15 mL, Rfl: 11   Insulin Syringe-Needle U-100 (INSULIN SYRINGE .3CC/29GX1/2") 29G X 1/2" 0.3 ML MISC, 1 each by Does not apply route 6 (six) times daily. (Patient not taking: Reported on 10/07/2021), Disp: 200 each, Rfl: 6   methocarbamol (ROBAXIN) 500 MG tablet, Take 1 tablet (500 mg total) by mouth 2 (two) times daily as needed for muscle spasms. (Patient not taking: Reported on 10/07/2021), Disp: 20 tablet, Rfl: 0   mupirocin ointment (BACTROBAN) 2 %, Apply to affected area 3 times daily (Patient not taking: Reported on 10/07/2021), Disp: , Rfl:    Ostomy Supplies (ADHESIVE REMOVER WIPES) MISC, Use as directed for pump and CGM sites., Disp: 50 each, Rfl: 3   promethazine (PHENERGAN) 12.5 MG tablet, Take by mouth. (Patient not taking: Reported on 10/07/2021), Disp: , Rfl:   Allergies as of 10/07/2021 - Review Complete 10/07/2021  Allergen Reaction Noted   Fish allergy Diarrhea 11/14/2016   Shellfish-derived products Diarrhea and Nausea And Vomiting 11/14/2016   Tape Itching 11/14/2016    Augmentin [amoxicillin-pot clavulanate] Nausea And Vomiting 08/09/2020   Lactose intolerance (gi)  08/09/2020   Tilactase Nausea And Vomiting 03/07/2013   Latex Rash 08/09/2020     reports that she quit smoking about 2 years ago. Her smoking use included cigarettes. She has a 0.65 pack-year smoking history. She has been exposed to tobacco smoke. She has never used smokeless tobacco. She reports that she does not drink alcohol and does not use drugs. Pediatric History  Patient Parents   Roman,Rebecca (Mother)   Roman,Rebecca (Mother)   Other Topics Concern   Not on file  Social History Narrative   Pt lives with mother. Mother is a Quarry manager.  They have two dogs and three cats. Patient is graduate from [employer]; Pt enjoys softball, sleep, and be with family.    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 School and Family:  Graduated HS.. Currently with mom.   2. Activities: Working cleaning houses. Does not eat at work. Not very hungry overall.  3. Primary Care Provider: Aquilla Hacker, MD  ROS: There are no other significant problems involving Cassandra [name]'s other body systems.    Objective:  Objective  Vital Signs:   BP 112/70   Pulse 96   Wt 173 lb (78.5 kg)   LMP 09/11/2021   BMI 33.79 kg/m   Growth %ile SmartLinks can only be used for patients less than 20 years  old.  Ht Readings from Last 3 Encounters:  06/26/21 5' (1.524 m)  06/17/21 5' 0.43" (1.535 m)  08/09/20 5' (1.524 m)   Wt Readings from Last 3 Encounters:  10/07/21 173 lb (78.5 kg)  06/26/21 171 lb (77.6 kg)  06/17/21 170 lb 9.6 oz (77.4 kg)   HC Readings from Last 3 Encounters:  No data found for [facility]   Body surface area is 1.82 meters squared. Facility age limit for growth %iles is 20 years. Facility age limit for growth %iles is 20 years.  PHYSICAL EXAM:    Constitutional: The patient appears healthy and well nourished. The  patient's height and weight are overweight for age.  Weight is +2 pounds Head: The head is normocephalic. Hair is thin but long Face: The face appears normal. There are no obvious dysmorphic features.  Eyes: The eyes appear to be normally formed and spaced. Gaze is conjugate. There is no obvious arcus or proptosis. Moisture appears normal.  Ears: The ears are normally placed and appear externally normal. Mouth: The oropharynx and tongue appear normal. Dentition appears to be normal for age. Oral moisture is normal. Neck: The neck appears to be visibly normal. Well healed surgical scar.  Lungs: Normal work of breathing Heart: good pulses and peripheral perfusion.  Abdomen: The abdomen appears to be normal in size for the patient's age. Bowel sounds are normal. There is no obvious hepatomegaly, splenomegaly, or other mass effect.  Arms: Muscle size and bulk are normal for age. Hands: There is no obvious tremor. Phalangeal and metacarpophalangeal joints are normal. Palmar muscles are normal for age. Palmar skin is normal. Palmar moisture is also normal. Legs: Muscles appear normal for age. No edema is present.  Feet: Feet are normally formed. Dorsalis pedal pulses are normal. Neurologic: Strength is normal for age in both the upper and lower extremities. Muscle tone is normal.  GYN/GU: normal female  LAB DATA:   pending today   Lab Results  Component Value Date   HGBA1C 8.1 06/17/2021   HGBA1C 7.9 (A) 03/18/2021   HGBA1C 8.0 (A) 11/27/2020   HGBA1C 7.7 (H) 08/27/2020   HGBA1C 7.2 (A) 05/15/2020   HGBA1C 7.8 (A) 02/15/2020   HGBA1C 9.0 (A) 11/14/2019   HGBA1C 10.2 (A) 06/19/2019   HGBA1C 8.9 (A) 03/13/2019   HGBA1C 8.8 (A) 11/28/2018   HGBA1C 8.2 (A) 08/24/2018   HGBA1C 8.7 (A) 05/09/2018   HGBA1C 9.4 (A) 02/01/2018   HGBA1C 8.0 (H) 01/10/2018   HGBA1C 7.9 (H) 11/01/2017   HGBA1C 7.8 (H) 10/14/2017   HGBA1C 7.5 (A) 09/15/2017   HGBA1C 8.6 05/18/2017   HGBA1C 9.1 02/24/2017    HGBA1C 8.2 11/19/2016   Lab Results  Component Value Date   CALCIUM 9.1 06/17/2021   CALCIUM 7.6 (L) 03/18/2021   CALCIUM 8.6 11/27/2020   CALCIUM 8.7 08/27/2020   CALCIUM 7.6 (L) 08/09/2020   CALCIUM 8.8 06/17/2020   CALCIUM 8.8 05/15/2020   CALCIUM 8.9 04/21/2020   CALCIUM 9.4 04/21/2020   CALCIUM 8.6 (L) 04/20/2020   CALCIUM 8.7 (L) 04/20/2020   CALCIUM 8.2 (L) 04/19/2020   CALCIUM 8.2 (L) 04/17/2020   CALCIUM 8.6 04/15/2020   CALCIUM 7.0 (L) 04/09/2020   CALCIUM 9.1 02/15/2020   CALCIUM 9.1 02/15/2020   CALCIUM 8.7 (L) 12/25/2019   CALCIUM 8.3 (L) 12/08/2019   CALCIUM 8.7 (L) 11/29/2019          Latest Reference Range & Units 03/18/21 14:45  Sodium 135 - 146 mmol/L 138  Potassium 3.5 - 5.3 mmol/L 3.7  Chloride 98 - 110 mmol/L 103  CO2 20 - 32 mmol/L 23  Glucose 65 - 139 mg/dL 194 (H)  BUN 7 - 25 mg/dL 16  Creatinine 0.50 - 0.96 mg/dL 0.88  Calcium 8.6 - 10.2 mg/dL 7.6 (L)  BUN/Creatinine Ratio 6 - 22 (calc) NOT APPLICABLE  Phosphorus 2.7 - 5.0 mg/dL 4.3  Magnesium 1.5 - 2.5 mg/dL 1.8  AG Ratio 1.0 - 2.5 (calc) 1.5  AST 10 - 30 U/L 14  ALT 6 - 29 U/L 11  Total Protein 6.1 - 8.1 g/dL 7.2  Total Bilirubin 0.2 - 1.2 mg/dL 0.4  Total CHOL/HDL Ratio <5.0 (calc) 3.1  Cholesterol <200 mg/dL 141  HDL Cholesterol > OR = 50 mg/dL 45 (L)  LDL Cholesterol (Calc) mg/dL (calc) 72  MICROALB/CREAT RATIO <30 mcg/mg creat 2  Non-HDL Cholesterol (Calc) <130 mg/dL (calc) 96  Triglycerides <150 mg/dL 158 (H)  Alkaline phosphatase (APISO) 31 - 125 U/L 71  Vitamin D, 25-Hydroxy 30 - 100 ng/mL 21 (L)  Globulin 1.9 - 3.7 g/dL (calc) 2.9  TSH mIU/L 1.20  T4,Free(Direct) 0.8 - 1.4 ng/dL 1.7 (H)  Albumin MSPROF 3.6 - 5.1 g/dL 4.3  Microalb, Ur mg/dL 0.4  Creatinine, Urine 20 - 275 mg/dL 237  (H): Data is abnormally high (L): Data is abnormally low     Assessment and Plan:  Assessment  ASSESSMENT: Dayelin is a 21 y.o. 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  Post surgical hypothyroidism  - On LT4 150 mcg daily - Clinically euthyroid- other than hair loss (which is stable) - Repeat thyroid levels today  Post surgical hypocalcemia/hypoparathyroidism - She is currently asymptomatic.  - Recent calcium values have been in target range - Continues on Calcium, Rocaltrol and Vit D3 replacement -D3 1000 IU/day + Oscal 500/200 - HCTZ 50 mg BID - Calcitriol 3 caps (0.75 mcg) am and 2 caps (0.5 mcg) pm  - Calcium 12 x 500 mg of elemental calcium = 6g of calcium/day She is taking an extra 65 mg of calcium as well - Repeat levels today for calcium, vit D-activated, phos, mag  Diabetes, uncontrolled, with hyper and hypoglycemia  - on T-Slim pump and Dexcom - T slim pump was replaced in October 2021 - She is now on Control IQ  but glucose values are variable  - A1C done recently at PCP office and was 8.8% This is higher than target and higher than last visit - POC CBG as above - Reviewed pump and dexcom downloads with patient in detail - Pump settings titrated  Time Basal Rate Correction factor Carb ratio Target BG   12a  1.25 30 mg/dL 10 110 mg/dL  4a 1.2-> 1.1 30 mg/dL 10 110 mg/dL  6a 1.25-> 1.15 30 mg/dL 10 110 mg/dL  8a 1.2 30 mg/dL 10 110 mg/dL  12PM 1.2 30 mg/dL 8-> 6 110 mg/dL  3pm 1.25 6m/dL 7-> 6 1130mdL  9p 1.4 30 mg/dL 7-> 6 110 mg/dL    Total Basal   30-> 29.6         Pump failure Lantus 30 units Novolog:  Carbs 1:10 Correction: 1 for every 30 points over 120.   Morning Hypoglycemia -Unclear etiology - Previously had ACTH stim for same concern in 2019 - Base Cortisol was 8.5 with stimulated value of 24.2 - Will get random cortisol with labs today.    Memory loss/neuropathy  -  she previously saw pediatric neurology for her neuropathy and was meant to be on Neurontin- but she could not swallow it.  - Has established adult Neurology - Not  currently taking neurontin - Neuro-cognitive testing revealed issues with short term memory  Hypertension:  -No longer taking Lisinopril -BP normal today - On HCTZ to decrease calcium excretion in the kidneys  Hyperlipidemia - She had mixed hyperlipidemia on non-fasting labs from PCP office - Lipids here February 2023 (also non-fasting) were in target - no intervention indicated at this time.    Lab Orders         Comprehensive metabolic panel         Vitamin D 1,25 dihydroxy         TSH         T4, free         Magnesium         Phosphorus         Cortisol         POCT Glucose (Device for Home Use)      Follow-up: No follow-ups on file.  Referral placed to [facility], Dr. Kelton Pillar. She is scheduled there in November 2023     Lelon Huh, MD  >60 minutes spent today reviewing the medical chart, counseling the patient/family, and documenting today's encounter.  When a patient is on insulin, intensive monitoring of blood glucose levels is necessary to avoid hyperglycemia and hypoglycemia. Severe hyperglycemia/hypoglycemia can lead to hospital admissions and be life threatening.    Patient referred by Aquilla Hacker, MD for type 1 diabetes.   Copy of this note sent to Aquilla Hacker, MD

## 2021-10-07 NOTE — Patient Instructions (Signed)
  Time Basal Rate Correction factor Carb ratio Target BG   12a  1.25 30 mg/dL 10 671 mg/dL  4a 2.4-> 1.1 30 mg/dL 10 580 mg/dL  6a 9.98-> 3.38 30 mg/dL 10 250 mg/dL  8a 1.2 30 mg/dL 10 539 mg/dL  76BH 1.2 30 mg/dL 8-> 6 419 mg/dL  3pm 3.79 30mg /dL 7-> 6 110mg /dL  9p 1.4 30 mg/dL 7-> 6 mg/dL    Total Basal   024         Pump failure Lantus 30 units Novolog:  Carbs 1:10 Correction: 1 for every 30 points over 120.   FOR SURGERY:  If you are in control IQ you may not need to do much to regulate your sugar. However, if you are getting low you may want to do a temp basal (you will have to go to manual control to do this) starting about an hour before your procedure and going through about 1-3 hours after your procedure. (50% or more- do not give no insulin because that will put you in DKA).

## 2021-10-07 NOTE — Progress Notes (Signed)
syc

## 2021-10-08 ENCOUNTER — Encounter (INDEPENDENT_AMBULATORY_CARE_PROVIDER_SITE_OTHER): Payer: Self-pay | Admitting: Pediatric Endocrinology

## 2021-10-10 LAB — COMPREHENSIVE METABOLIC PANEL
AG Ratio: 1.3 (calc) (ref 1.0–2.5)
ALT: 15 U/L (ref 6–29)
AST: 14 U/L (ref 10–30)
Albumin: 4 g/dL (ref 3.6–5.1)
Alkaline phosphatase (APISO): 83 U/L (ref 31–125)
BUN: 13 mg/dL (ref 7–25)
CO2: 22 mmol/L (ref 20–32)
Calcium: 8 mg/dL — ABNORMAL LOW (ref 8.6–10.2)
Chloride: 105 mmol/L (ref 98–110)
Creat: 0.91 mg/dL (ref 0.50–0.96)
Globulin: 3 g/dL (calc) (ref 1.9–3.7)
Glucose, Bld: 171 mg/dL — ABNORMAL HIGH (ref 65–139)
Potassium: 3.8 mmol/L (ref 3.5–5.3)
Sodium: 138 mmol/L (ref 135–146)
Total Bilirubin: 0.3 mg/dL (ref 0.2–1.2)
Total Protein: 7 g/dL (ref 6.1–8.1)

## 2021-10-10 LAB — MAGNESIUM: Magnesium: 1.8 mg/dL (ref 1.5–2.5)

## 2021-10-10 LAB — CORTISOL: Cortisol, Plasma: 7.4 ug/dL

## 2021-10-10 LAB — VITAMIN D 1,25 DIHYDROXY
Vitamin D 1, 25 (OH)2 Total: 10 pg/mL — ABNORMAL LOW (ref 18–72)
Vitamin D2 1, 25 (OH)2: <8 pg/mL
Vitamin D3 1, 25 (OH)2: 10 pg/mL

## 2021-10-10 LAB — PHOSPHORUS: Phosphorus: 4.4 mg/dL (ref 2.5–4.5)

## 2021-10-10 LAB — TSH: TSH: 0.41 mIU/L

## 2021-10-10 LAB — T4, FREE: Free T4: 1.2 ng/dL (ref 0.8–1.8)

## 2021-10-29 ENCOUNTER — Encounter (INDEPENDENT_AMBULATORY_CARE_PROVIDER_SITE_OTHER): Payer: Self-pay | Admitting: Pediatric Endocrinology

## 2021-10-29 DIAGNOSIS — E892 Postprocedural hypoparathyroidism: Secondary | ICD-10-CM

## 2021-11-01 ENCOUNTER — Other Ambulatory Visit (INDEPENDENT_AMBULATORY_CARE_PROVIDER_SITE_OTHER): Payer: Self-pay | Admitting: Pediatric Endocrinology

## 2021-11-04 ENCOUNTER — Encounter (INDEPENDENT_AMBULATORY_CARE_PROVIDER_SITE_OTHER): Payer: Self-pay | Admitting: Pediatric Endocrinology

## 2021-11-04 LAB — COMPREHENSIVE METABOLIC PANEL
AG Ratio: 1.4 (calc) (ref 1.0–2.5)
ALT: 73 U/L — ABNORMAL HIGH (ref 6–29)
AST: 17 U/L (ref 10–30)
Albumin: 4.2 g/dL (ref 3.6–5.1)
Alkaline phosphatase (APISO): 137 U/L — ABNORMAL HIGH (ref 31–125)
BUN: 12 mg/dL (ref 7–25)
CO2: 24 mmol/L (ref 20–32)
Calcium: 8 mg/dL — ABNORMAL LOW (ref 8.6–10.2)
Chloride: 105 mmol/L (ref 98–110)
Creat: 0.72 mg/dL (ref 0.50–0.96)
Globulin: 3.1 g/dL (calc) (ref 1.9–3.7)
Glucose, Bld: 198 mg/dL — ABNORMAL HIGH (ref 65–99)
Potassium: 4.2 mmol/L (ref 3.5–5.3)
Sodium: 140 mmol/L (ref 135–146)
Total Bilirubin: 0.5 mg/dL (ref 0.2–1.2)
Total Protein: 7.3 g/dL (ref 6.1–8.1)

## 2021-11-04 LAB — MAGNESIUM: Magnesium: 1.7 mg/dL (ref 1.5–2.5)

## 2021-11-04 LAB — PHOSPHORUS: Phosphorus: 5.4 mg/dL — ABNORMAL HIGH (ref 2.5–4.5)

## 2021-11-07 ENCOUNTER — Other Ambulatory Visit (INDEPENDENT_AMBULATORY_CARE_PROVIDER_SITE_OTHER): Payer: Self-pay | Admitting: Pediatric Endocrinology

## 2021-11-07 DIAGNOSIS — E892 Postprocedural hypoparathyroidism: Secondary | ICD-10-CM

## 2021-11-11 LAB — CORTISOL: Cortisol, Plasma: 9.7 ug/dL

## 2021-11-12 ENCOUNTER — Encounter (INDEPENDENT_AMBULATORY_CARE_PROVIDER_SITE_OTHER): Payer: Self-pay | Admitting: Pediatric Endocrinology

## 2021-11-13 ENCOUNTER — Ambulatory Visit (HOSPITAL_COMMUNITY): Payer: Self-pay | Admitting: Psychiatry

## 2021-11-13 ENCOUNTER — Other Ambulatory Visit (INDEPENDENT_AMBULATORY_CARE_PROVIDER_SITE_OTHER): Payer: Self-pay | Admitting: Pediatric Endocrinology

## 2021-11-13 DIAGNOSIS — E892 Postprocedural hypoparathyroidism: Secondary | ICD-10-CM

## 2021-11-14 ENCOUNTER — Encounter (INDEPENDENT_AMBULATORY_CARE_PROVIDER_SITE_OTHER): Payer: Self-pay | Admitting: Pediatric Endocrinology

## 2021-11-14 LAB — COMPREHENSIVE METABOLIC PANEL
ALT: 25 IU/L (ref 0–32)
AST: 24 IU/L (ref 0–40)
Albumin/Globulin Ratio: 1.5 (ref 1.2–2.2)
Albumin: 4.3 g/dL (ref 4.0–5.0)
Alkaline Phosphatase: 127 IU/L — ABNORMAL HIGH (ref 44–121)
BUN/Creatinine Ratio: 10 (ref 9–23)
BUN: 8 mg/dL (ref 6–20)
Bilirubin Total: <0.2 mg/dL (ref 0.0–1.2)
CO2: 24 mmol/L (ref 20–29)
Calcium: 8.6 mg/dL — ABNORMAL LOW (ref 8.7–10.2)
Chloride: 95 mmol/L — ABNORMAL LOW (ref 96–106)
Creatinine, Ser: 0.84 mg/dL (ref 0.57–1.00)
Globulin, Total: 2.9 g/dL (ref 1.5–4.5)
Glucose: 330 mg/dL — ABNORMAL HIGH (ref 70–99)
Potassium: 4.2 mmol/L (ref 3.5–5.2)
Sodium: 135 mmol/L (ref 134–144)
Total Protein: 7.2 g/dL (ref 6.0–8.5)

## 2021-11-18 ENCOUNTER — Encounter (INDEPENDENT_AMBULATORY_CARE_PROVIDER_SITE_OTHER): Payer: Self-pay | Admitting: Pediatric Endocrinology

## 2021-11-20 ENCOUNTER — Other Ambulatory Visit (INDEPENDENT_AMBULATORY_CARE_PROVIDER_SITE_OTHER): Payer: Self-pay | Admitting: Pediatric Endocrinology

## 2021-11-20 LAB — COMPREHENSIVE METABOLIC PANEL
ALT: 19 IU/L (ref 0–32)
AST: 20 IU/L (ref 0–40)
Albumin/Globulin Ratio: 1.4 (ref 1.2–2.2)
Albumin: 4.3 g/dL (ref 4.0–5.0)
Alkaline Phosphatase: 119 IU/L (ref 44–121)
BUN/Creatinine Ratio: 8 — ABNORMAL LOW (ref 9–23)
BUN: 7 mg/dL (ref 6–20)
Bilirubin Total: 0.2 mg/dL (ref 0.0–1.2)
CO2: 23 mmol/L (ref 20–29)
Calcium: 7.9 mg/dL — ABNORMAL LOW (ref 8.7–10.2)
Chloride: 98 mmol/L (ref 96–106)
Creatinine, Ser: 0.83 mg/dL (ref 0.57–1.00)
Globulin, Total: 3.1 g/dL (ref 1.5–4.5)
Glucose: 145 mg/dL — ABNORMAL HIGH (ref 70–99)
Potassium: 3.8 mmol/L (ref 3.5–5.2)
Sodium: 138 mmol/L (ref 134–144)
Total Protein: 7.4 g/dL (ref 6.0–8.5)

## 2021-11-20 MED ORDER — HYDROCHLOROTHIAZIDE 50 MG PO TABS: 50.0000 mg | ORAL_TABLET | Freq: Two times a day (BID) | ORAL | 3 refills | Status: DC

## 2021-12-09 ENCOUNTER — Encounter (HOSPITAL_COMMUNITY): Payer: Self-pay | Admitting: Psychiatry

## 2021-12-09 ENCOUNTER — Ambulatory Visit (INDEPENDENT_AMBULATORY_CARE_PROVIDER_SITE_OTHER): Payer: Medicaid Other | Admitting: Psychiatry

## 2021-12-09 VITALS — BP 124/84 | HR 97 | Ht 60.0 in | Wt 169.0 lb

## 2021-12-09 DIAGNOSIS — F411 Generalized anxiety disorder: Secondary | ICD-10-CM

## 2021-12-09 DIAGNOSIS — F331 Major depressive disorder, recurrent, moderate: Secondary | ICD-10-CM | POA: Diagnosis not present

## 2021-12-09 DIAGNOSIS — G4710 Hypersomnia, unspecified: Secondary | ICD-10-CM

## 2021-12-09 MED ORDER — CITALOPRAM HYDROBROMIDE 10 MG PO TABS: 5.0000 mg | ORAL_TABLET | Freq: Every day | ORAL | 0 refills | Status: DC

## 2021-12-09 NOTE — Progress Notes (Signed)
Psychiatric Initial Adult Assessment   Patient Identification: Cassandra [name] MRN:  [id number] Date of Evaluation:  12/09/2021 Referral Source: primary care Chief Complaint:   Chief Complaint  Patient presents with   New Patient (Initial Visit)   Depression   Visit Diagnosis:    ICD-10-CM   1. Generalized anxiety disorder  F41.1     2. MDD (major depressive disorder), recurrent episode, moderate (HCC)  F33.1     3. 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  She has been on other medication during Prozac, Zoloft but did not help.  She feels her anxiety has improved she is not worried excessively she is adjusting to her medical condition and is getting regular follow-up treatments.  She does feel her depression is there is something withdrawn decreased energy and motivation she is on Wellbutrin that was increased to 3 mg last year that has helped but she believes she has tiredness and decreased interest in things or a motivation she believes it may be related to her comorbid medical condition including low calcium surgery of parathyroid hormones along with thyroid ectomy.  There is no associated psychotic symptoms there is no associated manic symptoms currently or in the past  Does not endorse panic symptoms  Does not use drugs or alcohol  She has a good support system at home with her mom.  Dad now lives in [place].  She has a dog and also tries to keep her still busy but she has noticed decreased  energy during the daytime and excessive sleepiness she does not endorse snoring but she feels it may be possible Also recently there was a lost her job a few months ago and relationship changes.  Other contributing factor including her grandpa's death 4 years ago for depression  Aggravating factor: Grand PA death few years ago, job can be , co morbid medical conditions inlcuding IDDM, hypothyroid Modifying factor: family, dog,   Duration more then 4 years  Severity decreased energy, withdrawn at times  Hospital admission : denies  Suicide attempt denies: denies   Past Psychiatric History: depression, anxiety  Previous Psychotropic Medications: Yes  Prozac, cymbalta Substance Abuse History in the last 12 months:  No.  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/25/2019   S/P thyroidectomy 01/27/2019   Symptomatic mammary hypertrophy 03/17/2017   Thyroid goiter 05/09/2018   Type 1 diabetes, uncontrolled, 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    Past Surgical History:  Procedure Laterality Date   ADENOIDECTOMY     COLONOSCOPY     ESOPHAGOGASTRODUODENOSCOPY     THYROIDECTOMY N/A  01/25/2019   Procedure: COMPLETE THYROIDECTOMY;  Surgeon: Leta Baptist, MD;  Location: [facility] OR;  Service: ENT;  Laterality: N/A;   TOE SURGERY Bilateral    for ingrown toenails   TYMPANOSTOMY TUBE PLACEMENT     WISDOM TOOTH EXTRACTION      Family Psychiatric History: grand pa had depression, mom has anxiety  Family History:  Family History  Problem Relation Age of Onset   Hypertension Maternal Grandmother    Anxiety disorder Maternal Grandmother    ADD / ADHD Maternal Grandmother    Diabetes Maternal Grandmother    Hyperlipidemia Maternal Grandfather    Hypothyroidism Maternal Grandfather    Cancer Maternal Grandfather        blood cancer (unknown name)   Hyperlipidemia Mother    Anxiety disorder Mother    Hyperlipidemia Other    Migraines Neg Hx    Seizures Neg Hx    Depression Neg Hx    Autism Neg Hx    Bipolar disorder Neg Hx    Schizophrenia Neg Hx     Social History:   Social History   Socioeconomic History   Marital status: Single    Spouse name: Not on file   Number of children: Not on file   Years of education: 12   Highest education level: High school graduate  Occupational History   Not on file  Tobacco Use   Smoking status: Former    Packs/day: 0.13    Years: 5.00    Total pack years: 0.65    Types: Cigarettes    Quit date: 01/23/2019    Years since quitting: 2.8    Passive exposure: Current (mom smokes)   Smokeless tobacco: Never  Vaping Use   Vaping Use: Never used  Substance and Sexual Activity   Alcohol use: No   Drug use: No   Sexual activity: Not Currently    Birth control/protection: Injection    Comment: Nexplanon  Other Topics Concern   Not on file  Social History Narrative   Pt lives with mother. Mother is a Quarry manager.  They have two dogs and three cats. Patient is graduate from [employer]; Pt enjoys softball, sleep, and be with family.    Right handed   Occasionally caffeine   Works at a [employer] and [place] Strain: Not on file  Food Insecurity: Not on file  Transportation Needs: Not on file  Physical Activity: Not on file  Stress: Not on file  Social Connections: Not on file    Additional Social History: grew up with mom and step dad. Mostly with mom  No kids  Allergies:   Allergies  Allergen Reactions   Fish Allergy Diarrhea   Shellfish-Derived Products Diarrhea and Nausea And Vomiting   Tape Itching    Medical tape causes itching   Augmentin [Amoxicillin-Pot Clavulanate] Nausea And Vomiting   Lactose Intolerance (Gi)    Tilactase Nausea And Vomiting   Latex Rash    Metabolic Disorder Labs: Lab Results  Component Value Date   HGBA1C 8.1 06/17/2021   MPG 174 08/27/2020   MPG 182.9 01/10/2018   Lab Results  Component Value Date   PROLACTIN 12.3 05/22/2020   Lab Results  Component Value Date   CHOL 141 03/18/2021   TRIG 158 (H) 03/18/2021   HDL 45 (L) 03/18/2021   CHOLHDL 3.1 03/18/2021   LDLCALC 72 03/18/2021   LDLCALC 120 (H) 11/28/2018   Lab Results  Component Value Date   TSH 0.41 10/07/2021    Therapeutic Level Labs: No results found for: "LITHIUM" No results found for: "CBMZ" No results found for: "VALPROATE"  Current Medications: Current Outpatient Medications  Medication Sig Dispense Refill   amitriptyline (ELAVIL) 75 MG tablet Take 1 tablet (75 mg total) by mouth at bedtime. 90 tablet 1   aspirin-acetaminophen-caffeine (EXCEDRIN MIGRAINE) 742-595-63 MG tablet Take by mouth.     Blood Glucose Monitoring Suppl (ACCU-CHEK GUIDE) w/Device KIT 1 kit by Does not apply route daily as needed. 2 kit 5   buPROPion (WELLBUTRIN XL) 300 MG 24 hr tablet Take by mouth.     calcitRIOL (ROCALTROL) 0.25 MCG capsule Take 3 capsules (0.75 mcg) am and 2 capsules (0.5 mcg) pm 270 capsule 5   calcium acetate (PHOSLO) 667 MG capsule Take by mouth.     CALCIUM PO Take 6 tablets by mouth in the morning and at  bedtime.     cholestyramine (QUESTRAN) 4 g packet Take 1 packet by mouth daily.     Continuous Blood Gluc Receiver (DEXCOM G6 RECEIVER) DEVI 1 Device by Does not apply route daily as needed. 1 each 0   Continuous Blood Gluc Sensor (DEXCOM G6 SENSOR) MISC CHANGE SENSOR EVERY 10 DAYS AS DIRECTED. 3 each 5   Continuous Blood Gluc Transmit (DEXCOM G6 TRANSMITTER) MISC 1 kit by Does not apply route daily as needed. 1 each 1   DENTA 5000 PLUS 1.1 % CREA dental cream Take by mouth as directed.     fluconazole (DIFLUCAN) 150 MG tablet Take 1 tablet (150 mg total) by mouth daily. Take 1 tab. Repeat after 3 days if symptoms persist. 3 tablet 1   fluticasone (FLONASE) 50 MCG/ACT nasal spray Place into the nose.     Glucagon (BAQSIMI TWO PACK) 3 MG/DOSE POWD Place 1 each into the nose as needed (severe hypoglycmia with unresponsiveness). 1 each 3   hydrochlorothiazide (HYDRODIURIL) 50 MG tablet Take 1 tablet (50 mg total) by mouth 2 (two) times daily. 60 tablet 3   hydrOXYzine (ATARAX) 10 MG tablet TAKE 1 TABLET BY MOUTH THREE TIMES A DAY AS NEEDED 270 tablet 2   insulin degludec (TRESIBA) 200 UNIT/ML FlexTouch Pen Inject into the skin.     Insulin lispro (HUMALOG JUNIOR KWIKPEN) 100 UNIT/ML Inject up to 50 units daily per provider instructions. Please use in case insulin pump fails. 15 mL 11   insulin lispro (HUMALOG) 100 UNIT/ML injection INJECT UP TO 300 UNITS IN INSULIN PUMP EVERY 48 HOURS, PER DKA AND HYPERGLYCEMIA PROTOCOLS 40 mL 5   Insulin Syringe-Needle U-100 (INSULIN SYRINGE .3CC/29GX1/2") 29G X 1/2" 0.3 ML MISC 1 each by Does not apply route 6 (six) times daily. 200 each 6   levocetirizine (XYZAL) 5 MG tablet Take by mouth.     levothyroxine (SYNTHROID) 150 MCG tablet TAKE 1 TABLET BY MOUTH EVERY DAY 90 tablet 3   methocarbamol (ROBAXIN) 500 MG tablet Take 1 tablet (500 mg total) by mouth 2 (two) times daily as needed for muscle spasms.  20 tablet 0   metoprolol tartrate (LOPRESSOR) 50 MG tablet Take  50 mg by mouth 2 (two) times daily.     mupirocin ointment (BACTROBAN) 2 %      naproxen sodium (ALEVE) 220 MG tablet Take by mouth.     nystatin (MYCOSTATIN/NYSTOP) powder Apply topically.     Ostomy Supplies (ADHESIVE REMOVER WIPES) MISC Use as directed for pump and CGM sites. 50 each 3   pantoprazole (PROTONIX) 20 MG tablet Take 20 mg by mouth daily.     promethazine (PHENERGAN) 12.5 MG tablet Take by mouth.     Vitamin D3 (VITAMIN D) 25 MCG tablet Take 1 tablet (1,000 Units total) by mouth daily. 60 tablet 0   No current facility-administered medications for this visit.     Psychiatric Specialty Exam: Review of Systems  Cardiovascular:  Negative for chest pain.  Neurological:  Negative for tremors.  Psychiatric/Behavioral:  Positive for dysphoric mood.     Blood pressure 124/84, pulse 97, height 5' (1.524 m), weight 169 lb (76.7 kg).Body mass index is 33.01 kg/m.  General Appearance: Casual  Eye Contact:  Fair  Speech:  Slow  Volume:  Normal  Mood:   subdued  Affect:  Constricted  Thought Process:  Goal Directed  Orientation:  Full (Time, Place, and Person)  Thought Content:  Rumination  Suicidal Thoughts:  No  Homicidal Thoughts:  No  Memory:  Immediate;   Fair  Judgement:  Fair  Insight:  Fair  Psychomotor Activity:  Decreased  Concentration:  Concentration: Fair  Recall:  [place] of Knowledge:Good  Language: Good  Akathisia:  No  Handed:    AIMS (if indicated):  not done  Assets:  Desire for Improvement Social Support  ADL's:  Intact  Cognition: WNL  Sleep:   excessive   Screenings: GAD-7    Flowsheet Row Office Visit from 05/22/2020 in [place] and [place] [facility] and [place] Visit from 12/18/2019 in [facility] [place] [facility] and [place] Visit from 04/13/2019 in [facility] and [place] for [facility] and [place] Visit from 12/28/2018 in [facility] and [place] for [facility] and  [place] Visit from 08/16/2018 in [place] and [place] for Child and [place]  Total GAD-7 Score _0 PHQ2-9    [place] Visit from 12/09/2021 in [place] Office Visit from 05/22/2020 in [place] [facility] [place] [facility] and [place] Visit from 12/18/2019 in [facility] and [place] for [facility] and [place] Visit from 04/13/2019 in [facility] and [place] for Child and [place] Visit from 12/28/2018 in [place] and [place] for Child and [place]  PHQ-2 Total Score _1 PHQ-9 Total Score _2 [place] Office Visit from 12/09/2021 in [place] ED from 04/16/2021 in [facility]  ED from 08/09/2020 in [place] No Risk No Risk No Risk       Assessment and Plan: as follows  MDD recurrent moderate: continue wellbutrin 339m, add small dose of celexa 552mI will also recommend therapy to deal with his stress including grandparents that also adjustment to relationship and comorbid medical condition to  work on Radiographer, therapeutic  GAD: add celexa 53m. Atarax prn for anxiety we will start a small dose considering she has been on Prozac in the past and also her main concern is more so on the tiredness depression side will increase dose if she is in need and tolerating it Also recommend therapy Sleepiness: rule out sleep apnea, reviewed sleep hygiene, wakes up tired, will get a referral   She will requested by her primary care physician but understands she can inform her office if any questions or refills needed  Direct care time spent including face-to-face chart review and documentation 60 minutes Follow-up 4 weeks or earlier if needed  Collaboration of Care: Primary Care  Provider AEB meds and chart reviewed  Patient/Guardian was advised Release of Information must be obtained prior to any record release in order to collaborate their care with an outside provider. Patient/Guardian was advised if they have not already done so to contact the registration department to sign all necessary forms in order for [facility] release information regarding their care.   Consent: Patient/Guardian gives verbal consent for treatment and assignment of benefits for services provided during this visit. Patient/Guardian expressed understanding and agreed to proceed.   NMerian Capron MD 11/7/20239:19 AM

## 2021-12-11 ENCOUNTER — Ambulatory Visit (INDEPENDENT_AMBULATORY_CARE_PROVIDER_SITE_OTHER): Payer: Medicaid Other | Admitting: Internal Medicine

## 2021-12-11 ENCOUNTER — Encounter: Payer: Self-pay | Admitting: Internal Medicine

## 2021-12-11 VITALS — BP 122/82 | HR 91 | Ht 60.0 in | Wt 169.9 lb

## 2021-12-11 DIAGNOSIS — E890 Postprocedural hypothyroidism: Secondary | ICD-10-CM | POA: Diagnosis not present

## 2021-12-11 DIAGNOSIS — E1065 Type 1 diabetes mellitus with hyperglycemia: Secondary | ICD-10-CM

## 2021-12-11 DIAGNOSIS — E892 Postprocedural hypoparathyroidism: Secondary | ICD-10-CM

## 2021-12-11 LAB — POCT GLYCOSYLATED HEMOGLOBIN (HGB A1C): Hemoglobin A1C: 8.1 % — AB (ref 4.0–5.6)

## 2021-12-11 NOTE — Patient Instructions (Signed)
HOW TO TREAT LOW BLOOD SUGARS (Blood sugar LESS THAN 70 MG/DL) Please follow the RULE OF 15 for the treatment of hypoglycemia treatment (when your (blood sugars are less than 70 mg/dL)   STEP 1: Take 15 grams of carbohydrates when your blood sugar is low, which includes:  3-4 GLUCOSE TABS  OR 3-4 OZ OF JUICE OR REGULAR SODA OR ONE TUBE OF GLUCOSE GEL    STEP 2: RECHECK blood sugar in 15 MINUTES STEP 3: If your blood sugar is still low at the 15 minute recheck --> then, go back to STEP 1 and treat AGAIN with another 15 grams of carbohydrates.    24-Hour Urine Collection  You will be collecting your urine for a 24-hour period of time. Your timer starts with your first urine of the morning (For example - If you first pee at 9AM, your timer will start at 9AM) Throw away your first urine of the morning Collect your urine every time you pee for the next 24 hours STOP your urine collection 24 hours after you started the collection (For example - You would stop at 9AM the day after you started)

## 2021-12-11 NOTE — Progress Notes (Signed)
Name: Cassandra [name]  MRN/ DOB: [id number], Jul 02, 2000   Age/ Sex: 21 y.o., female    PCP: Aquilla Hacker, MD   Reason for Endocrinology Evaluation: Type 1 Diabetes Mellitus     Date of Initial Endocrinology Visit: 12/11/2021     Cassandra [name] IDENTIFIER: Cassandra [name] is a 21 y.o. female with a past medical history of HTN, T1DM, postsurgical Hypothyroidism. The Cassandra [name] presented for initial endocrinology clinic visit on 12/11/2021 for consultative assistance with her diabetes management.    HPI: Cassandra [name] was    Diagnosed with DM at age 85 Prior Medications tried/Intolerance: She has been on the tandem pump since 11/2019 Currently checking blood sugars multiple x / day, through Dexcom Hypoglycemia episodes : yes               Symptoms: yes                 Frequency: fasting  Hemoglobin A1c has ranged from 7.2% in 2022, peaking at 10.2% in 2021. Cassandra [name] required assistance for hypoglycemia: yes , years ago Cassandra [name] has required hospitalization within the last 1 year from hyper or hypoglycemia: last DKA 2020  In terms of diet, the Cassandra [name] eats 2 meals a day, does not snacks . Avoids sugar sweetened beverages    The Cassandra [name] has a diagnosis of neuropathy, was seen by pediatric neurology but was unable to swallow gabapentin     THYROID/PARATHYROID HISTORY She is s/p total thyroidectomy on 01/25/2019 due to compressive symptoms .  She has been on LT-for replacement Postoperative course was complicated by hypocalcemia, she is currently on calcium supplements, calcitriol, and HCTZ  Maternal great grandmother with DM  Maternal grandfather with thyroid disease    This Cassandra [name] with type 1 diabetes is treated with Tandem (insulin pump). During the visit the pump basal and bolus doses were reviewed including carb/insulin rations and supplemental doses. The clinical list was updated. The glucose meter download was reviewed in detail to determine if the current pump settings  are providing the best glycemic control without excessive hypoglycemia.  Pump and meter download:    Pump   Tandem  Settings  I:C  CF Target  Insulin type   Humalog       Basal rate          0000 1.250 10 30 180   0400 1.100 10 30 180   0600 1.150 10 30 120   0800 1.200 10 30 120   1200 1._0 120   1500 1.250 6 30 120   2100 1._1 180                   Type & Model of Pump: Tandem  Insulin Type: Currently using Humalog .  Body mass index is 33.18 kg/m.  PUMP STATISTICS: Average BG: 175  Average Daily Carbs (g): 53  Average Total Daily Insulin: 61.95  Average Daily Basal: 34 (62 %) Average Daily Bolus: 21 (38 %)    HOME ENDOCRINE REGIMEN: Humalog  Calcium 4 tabs 3 x daily  This is 6 grams per day. (OsCal 500/200).  Smart A-Z Minerals Calcium Ionic Mineral Dietary Supplement. 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/25/2019   S/P thyroidectomy 01/27/2019   Symptomatic mammary hypertrophy 03/17/2017   Thyroid goiter 05/09/2018   Type 1 diabetes, uncontrolled, 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   Past Surgical History:  Past Surgical History:  Procedure Laterality Date   ADENOIDECTOMY     COLONOSCOPY     ESOPHAGOGASTRODUODENOSCOPY     THYROIDECTOMY N/A 01/25/2019   Procedure: COMPLETE THYROIDECTOMY;  Surgeon: Leta Baptist, MD;  Location: [facility];  Service: ENT;  Laterality: N/A;   TOE SURGERY Bilateral    for ingrown toenails   TYMPANOSTOMY TUBE PLACEMENT     WISDOM TOOTH EXTRACTION      Social History:  reports that she quit smoking about 2 years ago. Her smoking use included cigarettes. She has a 0.65 pack-year smoking history. She has been exposed to tobacco smoke. She has never used smokeless tobacco. She reports that she does not drink alcohol and does not use drugs. Family History:  Family History  Problem Relation Age of Onset   Hypertension Maternal Grandmother    Anxiety disorder Maternal Grandmother    ADD / ADHD Maternal Grandmother    Diabetes Maternal Grandmother    Hyperlipidemia Maternal Grandfather    Hypothyroidism Maternal Grandfather    Cancer Maternal Grandfather        blood cancer (unknown name)   Hyperlipidemia Mother    Anxiety disorder Mother    Hyperlipidemia Other    Migraines Neg Hx    Seizures Neg Hx    Depression Neg Hx    Autism Neg Hx    Bipolar disorder Neg Hx    Schizophrenia Neg Hx      HOME MEDICATIONS: Allergies as of 12/11/2021       Reactions   Fish Allergy Diarrhea   Shellfish-derived Products Diarrhea, Nausea And Vomiting   Tape Itching   Medical tape causes itching   Augmentin [amoxicillin-pot Clavulanate] Nausea And Vomiting   Lactose Intolerance (gi)    Tilactase Nausea And Vomiting   Latex Rash        Medication List         Accurate as of December 11, 2021 11:21 AM. If you have any questions, ask your nurse or doctor.          STOP taking these medications    aspirin-acetaminophen-caffeine 250-250-65 MG tablet Commonly known as: EXCEDRIN MIGRAINE Stopped by: Dorita Sciara, MD   fluconazole 150 MG tablet Commonly known as: DIFLUCAN Stopped by: Dorita Sciara, MD   methocarbamol 500 MG tablet Commonly known as: ROBAXIN Stopped  by: Dorita Sciara, MD       TAKE these medications    Accu-Chek Guide w/Device Kit 1 kit by Does not apply route daily as needed.   Adhesive Remover Wipes Misc Use as directed for pump and CGM sites.   amitriptyline 75 MG tablet Commonly known as: ELAVIL Take 1 tablet (75 mg total) by mouth at bedtime.   Baqsimi Two Pack 3 MG/DOSE Powd Generic drug: Glucagon Place 1 each into the nose as needed (severe hypoglycmia with unresponsiveness).   buPROPion 300 MG 24 hr tablet Commonly known as: WELLBUTRIN XL Take by mouth.   calcitRIOL 0.25 MCG capsule Commonly known as: ROCALTROL Take 3 capsules (0.75 mcg) am and 2 capsules (0.5 mcg) pm   calcium acetate 667 MG capsule Commonly known as: PHOSLO Take by mouth.   CALCIUM PO Take 6 tablets by mouth in the morning and at bedtime.   cholestyramine 4 g packet Commonly known as: QUESTRAN Take 1 packet by mouth daily.   citalopram 10 MG tablet Commonly known as: CeleXA Take 0.5 tablets (5 mg total) by mouth daily.   Denta 5000 Plus 1.1 % Crea dental cream Generic drug: sodium fluoride Take by mouth as directed.   Dexcom G6 Receiver Devi 1 Device by Does not apply route daily as needed.   Dexcom G6 Sensor Misc CHANGE SENSOR EVERY 10 DAYS AS DIRECTED.   Dexcom G6 Transmitter Misc 1 kit by Does not apply route daily as needed.   fluticasone 50 MCG/ACT nasal spray Commonly known as: FLONASE Place into the nose.   hydrochlorothiazide 50 MG tablet Commonly known as:  HYDRODIURIL Take 1 tablet (50 mg total) by mouth 2 (two) times daily.   hydrOXYzine 10 MG tablet Commonly known as: ATARAX TAKE 1 TABLET BY MOUTH THREE TIMES A DAY AS NEEDED   insulin degludec 200 UNIT/ML FlexTouch Pen Commonly known as: TRESIBA Inject into the skin.   insulin lispro 100 UNIT/ML injection Commonly known as: HumaLOG INJECT UP TO 300 UNITS IN INSULIN PUMP EVERY 48 HOURS, PER DKA AND HYPERGLYCEMIA PROTOCOLS   Insulin lispro 100 UNIT/ML Commonly known as: HUMALOG Junior KwikPen Inject up to 50 units daily per provider instructions. Please use in case insulin pump fails.   INSULIN SYRINGE .3CC/29GX1/2" 29G X 1/2" 0.3 ML Misc 1 each by Does not apply route 6 (six) times daily.   levocetirizine 5 MG tablet Commonly known as: XYZAL Take by mouth.   levothyroxine 150 MCG tablet Commonly known as: SYNTHROID TAKE 1 TABLET BY MOUTH EVERY DAY   metoprolol tartrate 50 MG tablet Commonly known as: LOPRESSOR Take 50 mg by mouth 2 (two) times daily.   mupirocin ointment 2 % Commonly known as: BACTROBAN   naproxen sodium 220 MG tablet Commonly known as: ALEVE Take by mouth.   nystatin powder Commonly known as: MYCOSTATIN/NYSTOP Apply topically.   pantoprazole 20 MG tablet Commonly known as: PROTONIX Take 20 mg by mouth daily.   promethazine 12.5 MG tablet Commonly known as: PHENERGAN Take by mouth.   vitamin D3 25 MCG tablet Commonly known as: CHOLECALCIFEROL Take 1 tablet (1,000 Units total) by mouth daily.         ALLERGIES: Allergies  Allergen Reactions   Fish Allergy Diarrhea   Shellfish-Derived Products Diarrhea and Nausea And Vomiting   Tape Itching    Medical tape causes itching   Augmentin [Amoxicillin-Pot Clavulanate] Nausea And Vomiting   Lactose Intolerance (Gi)    Tilactase Nausea And Vomiting   Latex  Rash     REVIEW OF SYSTEMS: A comprehensive ROS was conducted with the Cassandra [name] and is negative except as per HPI and below:   Review of Systems  Gastrointestinal:  Positive for abdominal pain, diarrhea and nausea. Negative for vomiting.  Neurological:  Positive for tingling.      OBJECTIVE:   VITAL SIGNS: BP 122/82 (BP Location: Left Arm, Cassandra [name] Position: Sitting, Cuff Size: Large)   Pulse 91   Ht 5' (1.524 m)   Wt 169 lb 14.4 oz (77.1 kg)   SpO2 99%   BMI 33.18 kg/m    PHYSICAL EXAM:  General: Cassandra [name] appears well and is in NAD  Neck: General: Supple without adenopathy or carotid bruits. Thyroid: Thyroid size normal.  No goiter or nodules appreciated.   Lungs: Clear with good BS bilat with no rales, rhonchi, or wheezes  Heart: RRR   Abdomen:  soft, nontender  Extremities:  Lower extremities - No pretibial edema. No lesions.  Neuro: MS is good with appropriate affect, Cassandra [name] is alert and Ox3    DM foot exam: 12/11/2021  The skin of the feet is intact without sores or ulcerations. The pedal pulses are 2+ on right and 2+ on left. The sensation is decreased to a screening 5.07, 10 gram monofilament bilaterally   DATA REVIEWED:  Lab Results  Component Value Date   HGBA1C 8.1 06/17/2021   HGBA1C 7.9 (A) 03/18/2021   HGBA1C 8.0 (A) 11/27/2020    Latest Reference Range & Units 12/11/21 11:43  Sodium 134 - 144 mmol/L 138  Potassium 3.5 - 5.2 mmol/L 3.8  Chloride 96 - 106 mmol/L 100  CO2 20 - 29 mmol/L 21  Glucose 70 - 99 mg/dL 107 (H)  BUN 6 - 20 mg/dL 9  Creatinine 0.57 - 1.00 mg/dL 0.78  Calcium 8.7 - 10.2 mg/dL 8.0 (L)  BUN/Creatinine Ratio 9 - 23  12  eGFR >59 mL/min/1.73 111  Albumin 4.0 - 5.0 g/dL 4.4  MICROALB/CREAT RATIO  WILL FOLLOW (P)  Vitamin D, 25-Hydroxy 30.0 - 100.0 ng/mL 29.9 (L)    Latest Reference Range & Units Most Recent  PTH, Intact 15 - 65 pg/mL 6 (L) 12/11/21 11:43     Latest Reference Range & Units 12/11/21 11:43  TSH 0.450 - 4.500 uIU/mL 0.576      Latest Reference Range & Units 12/11/21 11:43  Vitamin D, 25-Hydroxy 30.0 - 100.0 ng/mL 29.9 (L)  (L): Data is  abnormally low    Thyroid pathology report 01/25/2019    FINAL MICROSCOPIC DIAGNOSIS:  A. THYROID, TOTAL, THYROIDECTOMY: - Diffuse lymphocytic thyroiditis (Hashimoto's). - No evidence of malignancy. - Benign parathyroid gland.    ASSESSMENT / PLAN / RECOMMENDATIONS:   1) Type 1 Diabetes Mellitus, Poorly controlled, With neuropathis complications - Most recent A1c of 8.1 %. Goal A1c < 7.0 %.    Plan: GENERAL: I have discussed with the Cassandra [name] the pathophysiology of diabetes. We went over the natural progression of the disease. I explained the complications associated with diabetes including retinopathy, nephropathy, neuropathy as well as increased risk of cardiovascular disease. We went over the benefit seen with glycemic control.  I explained to the Cassandra [name] that diabetic patients are at higher than normal risk for amputations.  The Cassandra [name] endorses hypoglycemia overnight, will reduce basal rate around that time Unfortunately she has been noted with severe hyperglycemia during the day, the Cassandra [name] has been noted not to consistently bolus with meals, she usually averages once a day and most of the  time the bolus is either halfway through the meal or after the meal Today we discussed the importance of entering carbohydrates with each meal, we discussed the importance of bolusing right before the meal rather not waiting after the meal  MEDICATIONS: Humalog    Pump   Tandem  Settings  I:C  CF Target  Insulin type   Humalog       Basal rate          0000 1.250 10 30 180   0400 0.99 10 30 180   0600 1.150 10 30 120   0800 1.200 10 30 120   1200 1._0 120   1500 1.250 6 30 120   2100 1._1 180                EDUCATION / INSTRUCTIONS: BG monitoring instructions: Cassandra [name] is instructed to check her blood sugars 3 times a day, before each meal. Call [facility] if: BG persistently < 70  I reviewed the Rule of 15 for the treatment of hypoglycemia in  detail with the Cassandra [name]. Literature supplied.   2) Diabetic complications:  Eye: Does not have known diabetic retinopathy.  Neuro/ Feet: Does  have known diabetic peripheral neuropathy. Renal: Cassandra [name] does not have known baseline CKD. She is not on an ACEI/ARB at present.   3) Postoperative hypothyroidism:   -Status post total thyroidectomy due to benign reasons -- Cassandra [name] educated extensively on the correct way to take levothyroxine (first thing in the morning with water, 30 minutes before eating or taking other medications). - Cassandra [name] encouraged to double dose the following day if she were to miss a dose given long half-life of levothyroxine. -TSH is normal  Continue levothyroxine 150 mcg daily     4) Postoperative hypoparathyroidism  -Corrected serum calcium continues to be low at 7.68 mg/DL, despite high doses of calcium -I will increase calcitriol as below -She is on HCTZ 15 MGs twice daily to prevent hypercalciuria, I have recommended proceeding with 24-hour urinary calcium excretion and adjusting the dose accordingly    Medications  -Oscal D 500/200 iu 4 tabs TID  - Smart A-Z Minerals Calcium Ionic Mineral Dietary Supplement. 65 mg of calcium per 5 mL daily  - Stop Calcitriol 0.25 mcg 3 tabs with breakfast and 2 tabs with dinner  - Start Calcitriol 0.5 mcg , 3 tabs every morning only  -  Continue HCTZ 50 mg twice a day - Increase Vitamin D 2000 iu daily     Signed electronically by: Mack Guise, MD  [facility]   [address] [place], [place] [address] Phone: [phone] FAX: [phone]   CC: Aquilla Hacker, MD [place] [address] Phone: [phone]  Fax: [phone]    Return to Endocrinology clinic as below: Future Appointments  Date Time Provider [place]  01/06/2022  4:00 PM Andreas Blower [facility] None  01/08/2022  4:30 PM Merian Capron, MD  [facility] None

## 2021-12-12 ENCOUNTER — Encounter: Payer: Self-pay | Admitting: Internal Medicine

## 2021-12-12 DIAGNOSIS — E1065 Type 1 diabetes mellitus with hyperglycemia: Secondary | ICD-10-CM | POA: Insufficient documentation

## 2021-12-12 LAB — BASIC METABOLIC PANEL
BUN/Creatinine Ratio: 12 (ref 9–23)
BUN: 9 mg/dL (ref 6–20)
CO2: 21 mmol/L (ref 20–29)
Calcium: 8 mg/dL — ABNORMAL LOW (ref 8.7–10.2)
Chloride: 100 mmol/L (ref 96–106)
Creatinine, Ser: 0.78 mg/dL (ref 0.57–1.00)
Glucose: 107 mg/dL — ABNORMAL HIGH (ref 70–99)
Potassium: 3.8 mmol/L (ref 3.5–5.2)
Sodium: 138 mmol/L (ref 134–144)
eGFR: 111 mL/min/{1.73_m2} (ref >59)

## 2021-12-12 LAB — PARATHYROID HORMONE, INTACT (NO CA): PTH: 6 pg/mL — ABNORMAL LOW (ref 15–65)

## 2021-12-12 LAB — MICROALBUMIN / CREATININE URINE RATIO
Creatinine, Urine: 122.8 mg/dL
Microalb/Creat Ratio: <2 mg/g creat (ref 0–29)
Microalbumin, Urine: <3 ug/mL

## 2021-12-12 LAB — TSH: TSH: 0.576 u[IU]/mL (ref 0.450–4.500)

## 2021-12-12 LAB — ALBUMIN: Albumin: 4.4 g/dL (ref 4.0–5.0)

## 2021-12-12 LAB — VITAMIN D 25 HYDROXY (VIT D DEFICIENCY, FRACTURES): Vit D, 25-Hydroxy: 29.9 ng/mL — ABNORMAL LOW (ref 30.0–100.0)

## 2021-12-12 MED ORDER — HYDROCHLOROTHIAZIDE 50 MG PO TABS: 50.0000 mg | ORAL_TABLET | Freq: Two times a day (BID) | ORAL | 3 refills | Status: DC

## 2021-12-12 MED ORDER — LEVOTHYROXINE SODIUM 150 MCG PO TABS: 150.0000 ug | ORAL_TABLET | Freq: Every day | ORAL | 3 refills | Status: DC

## 2021-12-12 MED ORDER — CALCITRIOL 0.5 MCG PO CAPS: 1.5000 ug | ORAL_CAPSULE | Freq: Every morning | ORAL | 3 refills | Status: DC

## 2021-12-31 ENCOUNTER — Other Ambulatory Visit (HOSPITAL_COMMUNITY): Payer: Self-pay | Admitting: Psychiatry

## 2022-01-06 ENCOUNTER — Ambulatory Visit (HOSPITAL_COMMUNITY): Payer: Medicaid Other | Admitting: Licensed Clinical Social Worker

## 2022-01-08 ENCOUNTER — Ambulatory Visit (HOSPITAL_COMMUNITY): Payer: Medicaid Other | Admitting: Psychiatry

## 2022-02-03 ENCOUNTER — Ambulatory Visit (HOSPITAL_COMMUNITY): Payer: Medicaid Other | Admitting: Licensed Clinical Social Worker

## 2022-02-05 ENCOUNTER — Encounter (HOSPITAL_COMMUNITY): Payer: Self-pay | Admitting: Psychiatry

## 2022-02-05 ENCOUNTER — Ambulatory Visit (INDEPENDENT_AMBULATORY_CARE_PROVIDER_SITE_OTHER): Payer: Medicaid Other | Admitting: Psychiatry

## 2022-02-05 VITALS — BP 111/85 | HR 106 | Ht 60.0 in | Wt 167.0 lb

## 2022-02-05 DIAGNOSIS — G4710 Hypersomnia, unspecified: Secondary | ICD-10-CM

## 2022-02-05 DIAGNOSIS — F331 Major depressive disorder, recurrent, moderate: Secondary | ICD-10-CM

## 2022-02-05 DIAGNOSIS — F411 Generalized anxiety disorder: Secondary | ICD-10-CM | POA: Diagnosis not present

## 2022-02-05 MED ORDER — CITALOPRAM HYDROBROMIDE 10 MG PO TABS: 10.0000 mg | ORAL_TABLET | Freq: Every day | ORAL | 1 refills | Status: DC

## 2022-02-05 NOTE — Progress Notes (Signed)
[facility] Follow up visit  Patient Identification: Cassandra [name] MRN:  [id number] Date of Evaluation:  02/05/2022 Referral Source: primary care Chief Complaint:   Chief Complaint  Patient presents with   Follow-up   Visit Diagnosis:    ICD-10-CM   1. Generalized anxiety disorder  F41.1     2. MDD (major depressive disorder), recurrent episode, moderate (HCC)  F33.1     3. 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  There is no associated psychotic symptoms there is no associated manic symptoms currently or in the past Last visit added  celexa has helped anxieety but still gets stressed  Job stress is better  Grief is ok   Aggravating factor: Grand PA death few years ago, job can be , co morbid medical conditions inlcuding IDDM, hypothyroid Modifying factor: family, dog  Duration more then 4 years  Severity better, less sleepy during the day   Past Psychiatric History: depression, anxiety  Previous Psychotropic Medications: Yes  Prozac, cymbalta Substance Abuse History in the last 12 months:  No.  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   Generalized anxiety  disorder 12/14/2016   GERD (gastroesophageal reflux disease)    Hashimoto's disease 01/2019   Hypertension    Hypocalcemia 02/07/2019   Hypoglycemia 11/14/2016   Hypokalemia    Hypomagnesemia    Insomnia 02/17/2017   Major depressive disorder    Migraine syndrome 03/17/2017   MRSA cellulitis    Neuropathy    Feet, legs, hands   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/25/2019   S/P thyroidectomy 01/27/2019   Symptomatic mammary hypertrophy 03/17/2017   Thyroid goiter 05/09/2018   Type 1 diabetes, uncontrolled, 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    Past Surgical History:  Procedure Laterality Date   ADENOIDECTOMY     COLONOSCOPY     ESOPHAGOGASTRODUODENOSCOPY     THYROIDECTOMY N/A 01/25/2019   Procedure: COMPLETE THYROIDECTOMY;  Surgeon: Leta Baptist, MD;  Location: [facility] OR;  Service: ENT;  Laterality: N/A;   TOE SURGERY Bilateral    for ingrown toenails   TYMPANOSTOMY TUBE PLACEMENT     WISDOM TOOTH EXTRACTION      Family Psychiatric History: grand pa had depression, mom has anxiety  Family History:  Family History  Problem Relation Age of Onset   Hypertension Maternal Grandmother    Anxiety disorder Maternal Grandmother    ADD / ADHD Maternal Grandmother    Diabetes Maternal Grandmother    Hyperlipidemia Maternal Grandfather    Hypothyroidism Maternal Grandfather    Cancer Maternal Grandfather  blood cancer (unknown name)   Hyperlipidemia Mother    Anxiety disorder Mother    Hyperlipidemia Other    Migraines Neg Hx    Seizures Neg Hx    Depression Neg Hx    Autism Neg Hx    Bipolar disorder Neg Hx    Schizophrenia Neg Hx     Social History:   Social History   Socioeconomic History   Marital status: Single    Spouse name: Not on file   Number of children: Not on file   Years of education: 12   Highest education level: High school graduate   Occupational History   Not on file  Tobacco Use   Smoking status: Former    Packs/day: 0.13    Years: 5.00    Total pack years: 0.65    Types: Cigarettes    Quit date: 01/23/2019    Years since quitting: 3.0    Passive exposure: Current (mom smokes)   Smokeless tobacco: Never  Vaping Use   Vaping Use: Never used  Substance and Sexual Activity   Alcohol use: No   Drug use: No   Sexual activity: Not Currently    Birth control/protection: Injection    Comment: Nexplanon  Other Topics Concern   Not on file  Social History Narrative   Pt lives with mother. Mother is a Quar[employer] manager.  They have two dogs and three cats. Patient is graduate from [employer]; Pt enjoys softball, sleep, and be with family.    Right handed   Occasionally caffeine   Works at a [employer] and [place] Strain: Not on file  Food Insecurity: Not on file  Transportation Needs: Not on file  Physical Activity: Not on file  Stress: Not on file  Social Connections: Not on file    Allergies:   Allergies  Allergen Reactions   Fish Allergy Diarrhea   Shellfish-Derived Products Diarrhea and Nausea And Vomiting   Tape Itching    Medical tape causes itching   Augmentin [Amoxicillin-Pot Clavulanate] Nausea And Vomiting   Lactose Intolerance (Gi)    Tilactase Nausea And Vomiting   Latex Rash    Metabolic Disorder Labs: Lab Results  Component Value Date   HGBA1C 8.1 (A) 12/11/2021   MPG 174 08/27/2020   MPG 182.9 01/10/2018   Lab Results  Component Value Date   PROLACTIN 12.3 05/22/2020   Lab Results  Component Value Date   CHOL 141 03/18/2021   TRIG 158 (H) 03/18/2021   HDL 45 (L) 03/18/2021   CHOLHDL 3.1 03/18/2021   LDLCALC 72 03/18/2021   LDLCALC 120 (H) 11/28/2018   Lab Results  Component Value Date   TSH 0.576 12/11/2021    Therapeutic Level Labs: No results found for: "LITHIUM" No results found for:  "CBMZ" No results found for: "VALPROATE"  Current Medications: Current Outpatient Medications  Medication Sig Dispense Refill   amitriptyline (ELAVIL) 75 MG tablet Take 1 tablet (75 mg total) by mouth at bedtime. 90 tablet 1   Blood Glucose Monitoring Suppl (ACCU-CHEK GUIDE) w/Device KIT 1 kit by Does not apply route daily as needed. 2 kit 5   buPROPion (WELLBUTRIN XL) 300 MG 24 hr tablet Take by mouth.     calcitRIOL (ROCALTROL) 0.5 MCG capsule Take 3 capsules (1.5 mcg total) by mouth every morning. 270 capsule 3   calcium acetate (PHOSLO) 667 MG capsule Take by mouth.  CALCIUM PO Take 6 tablets by mouth in the morning and at bedtime.     cholestyramine (QUESTRAN) 4 g packet Take 1 packet by mouth daily.     Continuous Blood Gluc Receiver (DEXCOM G6 RECEIVER) DEVI 1 Device by Does not apply route daily as needed. 1 each 0   Continuous Blood Gluc Sensor (DEXCOM G6 SENSOR) MISC CHANGE SENSOR EVERY 10 DAYS AS DIRECTED. 3 each 5   Continuous Blood Gluc Transmit (DEXCOM G6 TRANSMITTER) MISC 1 kit by Does not apply route daily as needed. 1 each 1   DENTA 5000 PLUS 1.1 % CREA dental cream Take by mouth as directed.     fluticasone (FLONASE) 50 MCG/ACT nasal spray Place into the nose.     Glucagon (BAQSIMI TWO PACK) 3 MG/DOSE POWD Place 1 each into the nose as needed (severe hypoglycmia with unresponsiveness). 1 each 3   hydrochlorothiazide (HYDRODIURIL) 50 MG tablet Take 1 tablet (50 mg total) by mouth 2 (two) times daily. 180 tablet 3   hydrOXYzine (ATARAX) 10 MG tablet TAKE 1 TABLET BY MOUTH THREE TIMES A DAY AS NEEDED 270 tablet 2   insulin degludec (TRESIBA) 200 UNIT/ML FlexTouch Pen Inject into the skin.     insulin lispro (HUMALOG) 100 UNIT/ML injection INJECT UP TO 300 UNITS IN INSULIN PUMP EVERY 48 HOURS, PER DKA AND HYPERGLYCEMIA PROTOCOLS 40 mL 5   Insulin Syringe-Needle U-100 (INSULIN SYRINGE .3CC/29GX1/2") 29G X 1/2" 0.3 ML MISC 1 each by Does not apply route 6 (six) times daily. 200  each 6   levocetirizine (XYZAL) 5 MG tablet Take by mouth.     levothyroxine (SYNTHROID) 150 MCG tablet Take 1 tablet (150 mcg total) by mouth daily. 90 tablet 3   metoprolol tartrate (LOPRESSOR) 50 MG tablet Take 50 mg by mouth 2 (two) times daily.     mupirocin ointment (BACTROBAN) 2 %      naproxen sodium (ALEVE) 220 MG tablet Take by mouth.     nystatin (MYCOSTATIN/NYSTOP) powder Apply topically.     Ostomy Supplies (ADHESIVE REMOVER WIPES) MISC Use as directed for pump and CGM sites. 50 each 3   pantoprazole (PROTONIX) 20 MG tablet Take 20 mg by mouth daily.     promethazine (PHENERGAN) 12.5 MG tablet Take by mouth.     Vitamin D3 (VITAMIN D) 25 MCG tablet Take 1 tablet (1,000 Units total) by mouth daily. 60 tablet 0   citalopram (CELEXA) 10 MG tablet Take 1 tablet (10 mg total) by mouth daily. 30 tablet 1   No current facility-administered medications for this visit.     Psychiatric Specialty Exam: Review of Systems  Cardiovascular:  Negative for chest pain.  Neurological:  Negative for tremors.  Psychiatric/Behavioral:  Positive for dysphoric mood.     Blood pressure 111/85, pulse (!) 106, height 5' (1.524 m), weight 167 lb (75.8 kg).Body mass index is 32.61 kg/m.  General Appearance: Casual  Eye Contact:  Fair  Speech:  Slow  Volume:  Normal  Mood:  better  Affect:  Constricted  Thought Process:  Goal Directed  Orientation:  Full (Time, Place, and Person)  Thought Content:  Rumination  Suicidal Thoughts:  No  Homicidal Thoughts:  No  Memory:  Immediate;   Fair  Judgement:  Fair  Insight:  Fair  Psychomotor Activity:  Decreased  Concentration:  Concentration: Fair  Recall:  [place] of Knowledge:Good  Language: Good  Akathisia:  No  Handed:    AIMS (if indicated):  not  done  Assets:  Desire for Improvement Social Support  ADL's:  Intact  Cognition: WNL  Sleep:   excessive   Screenings: GAD-7    Flowsheet Row Office Visit from 05/22/2020 in [place] and  [place] [facility] [place] Visit from 12/18/2019 in [facility] [place] [facility] and [place] Visit from 04/13/2019 in [facility] and [place] for [facility] and [place] Visit from 12/28/2018 in [facility] and [place] [facility] and [place] Visit from 08/16/2018 in [place] [facility] [place] for [facility] and [place]  Total GAD-7 Score _0 PHQ2-9    [facility] Visit from 12/09/2021 in [facility] Office Visit from 05/22/2020 in [place] and [place] for [facility] and [place] Visit from 12/18/2019 in [place] and [place] for [facility] and [place] Visit from 04/13/2019 in [place] and [place] for Child and [place] Visit from 12/28/2018 in [place] and [place] for [facility] and [place]  PHQ-2 Total Score _1 PHQ-9 Total Score _2 [place] Office Visit from 12/09/2021 in [facility] ED from 04/16/2021 in [facility]  ED from 08/09/2020 in [place] No Risk No Risk No Risk       Assessment and Plan: as follows  Prior documentation reviewed   MDD recurrent moderate: some bette, continue wellbutrin increase celexa to 69m  GAD: some improvement will increase celexa to 161mconsider therapy, she will reschedule   Sleepiness: rule out sleep apnea, doing better by not taking naps during the day and shifting wellbutrin to day time  Fu [id number] care time spent 20 min including chart review and documentation  Collaboration of Care: Primary Care Provider AEB meds and chart reviewed  Patient/Guardian was advised Release of Information must be obtained prior to any record release in order to collaborate  their care with an outside provider. Patient/Guardian was advised if they have not already done so to contact the registration department to sign all necessary forms in order for us [place] release information regarding their care.   Consent: Patient/Guardian gives verbal consent for treatment and assignment of benefits for services provided during this visit. Patient/Guardian expressed understanding and agreed to proceed.   NadMerian CapronD 1/4/20244:41 PM

## 2022-02-26 ENCOUNTER — Other Ambulatory Visit (INDEPENDENT_AMBULATORY_CARE_PROVIDER_SITE_OTHER): Payer: Self-pay | Admitting: Pediatric Endocrinology

## 2022-02-26 DIAGNOSIS — E1040 Type 1 diabetes mellitus with diabetic neuropathy, unspecified: Secondary | ICD-10-CM

## 2022-02-28 ENCOUNTER — Encounter: Payer: Self-pay | Admitting: Internal Medicine

## 2022-03-09 ENCOUNTER — Ambulatory Visit (INDEPENDENT_AMBULATORY_CARE_PROVIDER_SITE_OTHER): Payer: Medicaid Other | Admitting: Licensed Clinical Social Worker

## 2022-03-09 DIAGNOSIS — F489 Nonpsychotic mental disorder, unspecified: Secondary | ICD-10-CM

## 2022-03-09 NOTE — Progress Notes (Signed)
Patient did not show for assessment 

## 2022-03-10 ENCOUNTER — Encounter: Payer: Self-pay | Admitting: Internal Medicine

## 2022-03-13 NOTE — Telephone Encounter (Signed)
F/u  The patient stop by the office to have forms to be filled out .   Accommodation Medical Assessment Form placed in MD folder.

## 2022-03-18 ENCOUNTER — Encounter: Payer: Self-pay | Admitting: Internal Medicine

## 2022-03-24 ENCOUNTER — Other Ambulatory Visit (INDEPENDENT_AMBULATORY_CARE_PROVIDER_SITE_OTHER): Payer: Self-pay | Admitting: Pediatric Endocrinology

## 2022-03-24 DIAGNOSIS — E1040 Type 1 diabetes mellitus with diabetic neuropathy, unspecified: Secondary | ICD-10-CM

## 2022-03-25 ENCOUNTER — Other Ambulatory Visit: Payer: Self-pay

## 2022-03-25 DIAGNOSIS — E1040 Type 1 diabetes mellitus with diabetic neuropathy, unspecified: Secondary | ICD-10-CM

## 2022-03-25 MED ORDER — INSULIN LISPRO 100 UNIT/ML IJ SOLN: INTRAMUSCULAR | 5 refills | Status: AC

## 2022-03-27 NOTE — Telephone Encounter (Signed)
Patients mother Wells Guiles came back by to drop off FMLA paperwork to be corrected. Call back [phone]

## 2022-03-30 ENCOUNTER — Encounter (INDEPENDENT_AMBULATORY_CARE_PROVIDER_SITE_OTHER): Payer: Self-pay | Admitting: Pediatric Endocrinology

## 2022-04-07 ENCOUNTER — Ambulatory Visit (HOSPITAL_COMMUNITY): Payer: Medicaid Other | Admitting: Psychiatry

## 2022-04-07 ENCOUNTER — Telehealth (INDEPENDENT_AMBULATORY_CARE_PROVIDER_SITE_OTHER): Payer: Self-pay | Admitting: Pediatric Endocrinology

## 2022-04-07 NOTE — Telephone Encounter (Signed)
Paperwork given to Dr. Badik. 

## 2022-04-07 NOTE — Telephone Encounter (Signed)
Forms completed

## 2022-04-07 NOTE — Telephone Encounter (Signed)
Originals placed up front for pick up, copy sent to patient by mychart.

## 2022-04-07 NOTE — Telephone Encounter (Signed)
Who's calling (name and relationship to patient) : Cassandra [name]; mom   Best contact number: [phone]  Provider they see: Dr. Baldo Ash  Reason for call: Mom has dropped off paper and stated everything needs to be filled out.   Call ID:      PRESCRIPTION REFILL ONLY  Name of prescription:  Pharmacy:

## 2022-04-14 ENCOUNTER — Ambulatory Visit: Payer: Medicaid Other | Admitting: Internal Medicine

## 2022-04-28 ENCOUNTER — Encounter (HOSPITAL_COMMUNITY): Payer: Self-pay | Admitting: Psychiatry

## 2022-04-28 ENCOUNTER — Ambulatory Visit (INDEPENDENT_AMBULATORY_CARE_PROVIDER_SITE_OTHER): Payer: Medicaid Other | Admitting: Psychiatry

## 2022-04-28 VITALS — BP 114/87 | HR 86 | Ht 60.0 in | Wt 159.0 lb

## 2022-04-28 DIAGNOSIS — G4710 Hypersomnia, unspecified: Secondary | ICD-10-CM | POA: Diagnosis not present

## 2022-04-28 DIAGNOSIS — F411 Generalized anxiety disorder: Secondary | ICD-10-CM

## 2022-04-28 DIAGNOSIS — F331 Major depressive disorder, recurrent, moderate: Secondary | ICD-10-CM

## 2022-04-28 NOTE — Progress Notes (Signed)
[facility] Follow up visit  Patient Identification: Cassandra [name] MRN:  [id number] Date of Evaluation:  04/28/2022 Referral Source: primary care Chief Complaint:   Chief Complaint  Patient presents with   Follow-up   Visit Diagnosis:  No diagnosis found. 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  Last visit increased celexa to 10mg  doing better, has helped depression, anxiety  Going thru GI and liver evaluation that is stressful   Job stress is better  Grief is ok   Aggravating factor: Grand PA death few years ago, job can be , co morbid medical conditions inlcuding IDDM, hypothyroid Modifying factor: family, dog  Duration more then 4 years  Severity ; fair   Past Psychiatric History: depression, anxiety  Previous Psychotropic Medications: Yes  Prozac, cymbalta Substance Abuse History in the last 12 months:  No.  Consequences of Substance Abuse: NA  Past Medical History:  Past Medical History:  Diagnosis Date   Autoimmune thyroiditis [id number]   Complication of anesthesia    woke up during Colonoscopy and EGD   Dyslipidemia 04/08/2014   Dysrhythmia    Tachy   Family history of adverse reaction to anesthesia    Mom woke up during surgery.   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/25/2019   S/P thyroidectomy 01/27/2019   Symptomatic mammary hypertrophy 03/17/2017   Thyroid goiter 05/09/2018   Type 1 diabetes, uncontrolled, 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    Past Surgical History:  Procedure Laterality Date   ADENOIDECTOMY     COLONOSCOPY     ESOPHAGOGASTRODUODENOSCOPY     THYROIDECTOMY N/A 01/25/2019   Procedure: COMPLETE THYROIDECTOMY;  Surgeon: Leta Baptist, MD;  Location: [facility] OR;  Service: ENT;  Laterality: N/A;   TOE SURGERY Bilateral    for ingrown toenails   TYMPANOSTOMY TUBE PLACEMENT     WISDOM TOOTH EXTRACTION      Family Psychiatric History: grand pa had depression, mom has anxiety  Family History:  Family History  Problem Relation Age of Onset   Hypertension Maternal Grandmother    Anxiety disorder Maternal Grandmother    ADD / ADHD Maternal Grandmother    Diabetes Maternal Grandmother    Hyperlipidemia Maternal Grandfather    Hypothyroidism Maternal Grandfather    Cancer Maternal Grandfather        blood cancer (unknown name)   Hyperlipidemia Mother    Anxiety disorder Mother    Hyperlipidemia Other    Migraines Neg Hx    Seizures Neg Hx    Depression Neg Hx  Autism Neg Hx    Bipolar disorder Neg Hx    Schizophrenia Neg Hx     Social History:   Social History   Socioeconomic History   Marital status: Single    Spouse name: Not on file   Number of children: Not on file   Years of education: 12   Highest education level: High school graduate  Occupational History   Not on file  Tobacco Use   Smoking status: Former    Packs/day: 0.13    Years: 5.00    Additional pack years: 0.00    Total pack years: 0.65    Types: Cigarettes    Quit  date: 01/23/2019    Years since quitting: 3.2    Passive exposure: Current (mom smokes)   Smokeless tobacco: Never  Vaping Use   Vaping Use: Never used  Substance and Sexual Activity   Alcohol use: No   Drug use: No   Sexual activity: Not Currently    Birth control/protection: Injection    Comment: Nexplanon  Other Topics Concern   Not on file  Social History Narrative   Pt lives with mother. Mother is a [employer]ar[employer] manager.  They have two dogs and three cats. Patient is graduate from [employer]; Pt enjoys softball, sleep, and be with family.    Right handed   Occasionally caffeine   Works at a [employer] and [place] Strain: Not on file  Food Insecurity: Not on file  Transportation Needs: Not on file  Physical Activity: Not on file  Stress: Not on file  Social Connections: Not on file    Allergies:   Allergies  Allergen Reactions   Fish Allergy Diarrhea   Shellfish-Derived Products Diarrhea and Nausea And Vomiting   Tape Itching    Medical tape causes itching   Augmentin [Amoxicillin-Pot Clavulanate] Nausea And Vomiting   Lactose Intolerance (Gi)    Tilactase Nausea And Vomiting   Latex Rash    Metabolic Disorder Labs: Lab Results  Component Value Date   HGBA1C 8.1 (A) 12/11/2021   MPG 174 08/27/2020   MPG 182.9 01/10/2018   Lab Results  Component Value Date   PROLACTIN 12.3 05/22/2020   Lab Results  Component Value Date   CHOL 141 03/18/2021   TRIG 158 (H) 03/18/2021   HDL 45 (L) 03/18/2021   CHOLHDL 3.1 03/18/2021   LDLCALC 72 03/18/2021   LDLCALC 120 (H) 11/28/2018   Lab Results  Component Value Date   TSH 0.576 12/11/2021    Therapeutic Level Labs: No results found for: "LITHIUM" No results found for: "CBMZ" No results found for: "VALPROATE"  Current Medications: Current Outpatient Medications  Medication Sig Dispense Refill   amitriptyline (ELAVIL) 75 MG tablet Take  1 tablet (75 mg total) by mouth at bedtime. 90 tablet 1   Blood Glucose Monitoring Suppl (ACCU-CHEK GUIDE) w/Device KIT 1 kit by Does not apply route daily as needed. 2 kit 5   buPROPion (WELLBUTRIN XL) 300 MG 24 hr tablet Take by mouth.     calcitRIOL (ROCALTROL) 0.5 MCG capsule Take 3 capsules (1.5 mcg total) by mouth every morning. 270 capsule 3   calcium acetate (PHOSLO) 667 MG capsule Take by mouth.     CALCIUM PO Take 6 tablets by mouth in the morning and at bedtime.     cholestyramine (QUESTRAN) 4 g packet Take 1 packet by mouth daily.  citalopram (CELEXA) 10 MG tablet Take 1 tablet (10 mg total) by mouth daily. 30 tablet 1   Continuous Blood Gluc Receiver (DEXCOM G6 RECEIVER) DEVI 1 Device by Does not apply route daily as needed. 1 each 0   Continuous Blood Gluc Sensor (DEXCOM G6 SENSOR) MISC CHANGE SENSOR EVERY 10 DAYS AS DIRECTED. 3 each 5   Continuous Blood Gluc Transmit (DEXCOM G6 TRANSMITTER) MISC 1 kit by Does not apply route daily as needed. 1 each 1   DENTA 5000 PLUS 1.1 % CREA dental cream Take by mouth as directed.     fluticasone (FLONASE) 50 MCG/ACT nasal spray Place into the nose.     Glucagon (BAQSIMI TWO PACK) 3 MG/DOSE POWD Place 1 each into the nose as needed (severe hypoglycmia with unresponsiveness). 1 each 3   hydrochlorothiazide (HYDRODIURIL) 50 MG tablet Take 1 tablet (50 mg total) by mouth 2 (two) times daily. 180 tablet 3   hydrOXYzine (ATARAX) 10 MG tablet TAKE 1 TABLET BY MOUTH THREE TIMES A DAY AS NEEDED 270 tablet 2   insulin degludec (TRESIBA) 200 UNIT/ML FlexTouch Pen Inject into the skin.     insulin lispro (HUMALOG) 100 UNIT/ML injection INJECT UP TO 300 UNITS IN INSULIN PUMP EVERY 48 HOURS, PER DKA AND HYPERGLYCEMIA PROTOCOLS 40 mL 5   Insulin Syringe-Needle U-100 (INSULIN SYRINGE .3CC/29GX1/2") 29G X 1/2" 0.3 ML MISC 1 each by Does not apply route 6 (six) times daily. 200 each 6   levocetirizine (XYZAL) 5 MG tablet Take by mouth.     levothyroxine  (SYNTHROID) 150 MCG tablet Take 1 tablet (150 mcg total) by mouth daily. 90 tablet 3   metoprolol tartrate (LOPRESSOR) 50 MG tablet Take 50 mg by mouth 2 (two) times daily.     mupirocin ointment (BACTROBAN) 2 %      naproxen sodium (ALEVE) 220 MG tablet Take by mouth.     nystatin (MYCOSTATIN/NYSTOP) powder Apply topically.     Ostomy Supplies (ADHESIVE REMOVER WIPES) MISC Use as directed for pump and CGM sites. 50 each 3   pantoprazole (PROTONIX) 20 MG tablet Take 20 mg by mouth daily.     promethazine (PHENERGAN) 12.5 MG tablet Take by mouth.     Vitamin D3 (VITAMIN D) 25 MCG tablet Take 1 tablet (1,000 Units total) by mouth daily. 60 tablet 0   No current facility-administered medications for this visit.     Psychiatric Specialty Exam: Review of Systems  Cardiovascular:  Negative for chest pain.  Neurological:  Negative for tremors.    Blood pressure 114/87, pulse 86, height 5' (1.524 m), weight 159 lb (72.1 kg).Body mass index is 31.05 kg/m.  General Appearance: Casual  Eye Contact:  Fair  Speech:  Slow  Volume:  Normal  Mood:  fair  Affect:  Constricted  Thought Process:  Goal Directed  Orientation:  Full (Time, Place, and Person)  Thought Content:  Rumination  Suicidal Thoughts:  No  Homicidal Thoughts:  No  Memory:  Immediate;   Fair  Judgement:  Fair  Insight:  Fair  Psychomotor Activity:  Decreased  Concentration:  Concentration: Fair  Recall:  [employer] of Knowledge:Good  Language: Good  Akathisia:  No  Handed:    AIMS (if indicated):  not done  Assets:  Desire for Improvement Social Support  ADL's:  Intact  Cognition: WNL  Sleep:   excessive   Screenings: GAD-7    Flowsheet Row Office Visit from 05/22/2020 in [place]  [facility] for [place] Office Visit from 12/18/2019 in [place] for [place] Office Visit from 04/13/2019 in [place] for  [place] Office Visit from 12/28/2018 in [place] for [place] Office Visit from 08/16/2018 in [place] for [place]  Total GAD-7 Score 11 12 17 10 17       PHQ2-9    [place] Office Visit from 12/09/2021 in [place] [facility] [place] Visit from 05/22/2020 in [place] for [place] Office Visit from 12/18/2019 in [place] for [place] Office Visit from 04/13/2019 in [place] for [place] Office Visit from 12/28/2018 in [place] for [place]  PHQ-2 Total Score 2 1 1 3 3   PHQ-9 Total Score 11 7 10 15 8       [place] Office Visit from 12/09/2021 in [place] at [facility] ED from 04/16/2021 in [facility] at [facility]) ED from 08/09/2020 in [facility] at [place] No Risk No Risk No Risk       Assessment and Plan: as follows  Prior documentation reviewed   MDD recurrent moderate: fair continue celexa  GAD: some better, continue celexa   Sleepiness: rule out sleep apnea, doing better by not taking naps during the day and shifting wellbutrin to day time Still not got sleep apnea evaluation done Fu 80m. Direct care time spent 15 minutes including chart review and documentation  Collaboration of Care: Primary Care Provider AEB meds and chart reviewed  Patient/Guardian was advised Release of Information must be obtained prior to any record release in order to collaborate their care with an outside provider. Patient/Guardian was advised if they have not already done so to contact the registration department to sign all necessary  forms in order for [place] to release information regarding their care.   Consent: Patient/Guardian gives verbal consent for treatment and assignment of benefits for services provided during this visit. Patient/Guardian expressed understanding and agreed to proceed.   Merian Capron, MD 3/26/20243:25 PM

## 2022-05-20 ENCOUNTER — Telehealth (HOSPITAL_COMMUNITY): Payer: Self-pay

## 2022-05-20 NOTE — Telephone Encounter (Signed)
Medication request - Fax from patient's [facility] [address] for a new Citalopram 10 mg order, last provided on 02/05/22 for 30 days + 1 refill. Patient last seen and medication continued per chart review of note 04/28/22 and returns 07/28/22. Patient's pharmacy requesting a 90 day order of the Citalopram 10 mg, one a day for patient.

## 2022-05-21 MED ORDER — CITALOPRAM HYDROBROMIDE 10 MG PO TABS: 10.0000 mg | ORAL_TABLET | Freq: Every day | ORAL | 0 refills | Status: DC

## 2022-05-25 ENCOUNTER — Telehealth (HOSPITAL_COMMUNITY): Payer: Self-pay

## 2022-05-25 MED ORDER — CITALOPRAM HYDROBROMIDE 10 MG PO TABS: 10.0000 mg | ORAL_TABLET | Freq: Every day | ORAL | 0 refills | Status: DC

## 2022-05-25 NOTE — Telephone Encounter (Signed)
Medication refill - Fax from [facility]'s [facility] requesting a 90 day order for her Citalopram, last provided for only 30 days on 05/21/22 and patient returns next on 07/28/22.

## 2022-06-04 ENCOUNTER — Telehealth (HOSPITAL_COMMUNITY): Payer: Self-pay

## 2022-06-04 ENCOUNTER — Encounter (HOSPITAL_COMMUNITY): Payer: Self-pay | Admitting: Psychiatry

## 2022-06-04 NOTE — Telephone Encounter (Signed)
Advice - Patient called to request that Dr. Gilmore Laroche write her a letter to be able to keep her animal support dog for her apartment and to also be able to take her in other places that allow animal supports. Informed this request would be sent to provider to question if he would support this and would provide.  Informed patient not sure this would allow her to take her dog to all places as this would not be for a service dog as she stated understanding and the difference.

## 2022-06-09 ENCOUNTER — Telehealth (HOSPITAL_COMMUNITY): Payer: Self-pay | Admitting: Psychiatry

## 2022-06-09 NOTE — Telephone Encounter (Signed)
Patient called requesting refill of:  buPROPion (WELLBUTRIN XL) 300 MG 24 hr tablet   [id number] - [place], [place] - [address] Phone: [phone]  Fax: [phone]     Last ordered by historical provider.  Last visit: 04/28/2022  Next visit: 07/28/2022

## 2022-06-10 MED ORDER — BUPROPION HCL ER (XL) 300 MG PO TB24: 300.0000 mg | ORAL_TABLET | Freq: Every day | ORAL | 1 refills | Status: DC

## 2022-06-17 ENCOUNTER — Encounter: Payer: Self-pay | Admitting: Obstetrics and Gynecology

## 2022-06-24 ENCOUNTER — Ambulatory Visit
Admission: EM | Admit: 2022-06-24 | Discharge: 2022-06-24 | Disposition: A | Discharge status: Home / Self Care | Payer: Medicaid Other | Attending: Nurse Practitioner | Admitting: Nurse Practitioner

## 2022-06-24 DIAGNOSIS — Z794 Long term (current) use of insulin: Secondary | ICD-10-CM | POA: Diagnosis not present

## 2022-06-24 DIAGNOSIS — J0100 Acute maxillary sinusitis, unspecified: Secondary | ICD-10-CM | POA: Diagnosis not present

## 2022-06-24 DIAGNOSIS — R051 Acute cough: Secondary | ICD-10-CM | POA: Diagnosis present

## 2022-06-24 DIAGNOSIS — Z1152 Encounter for screening for COVID-19: Secondary | ICD-10-CM | POA: Insufficient documentation

## 2022-06-24 DIAGNOSIS — R0981 Nasal congestion: Secondary | ICD-10-CM | POA: Diagnosis present

## 2022-06-24 DIAGNOSIS — Z7722 Contact with and (suspected) exposure to environmental tobacco smoke (acute) (chronic): Secondary | ICD-10-CM | POA: Insufficient documentation

## 2022-06-24 DIAGNOSIS — E1065 Type 1 diabetes mellitus with hyperglycemia: Secondary | ICD-10-CM | POA: Insufficient documentation

## 2022-06-24 DIAGNOSIS — B349 Viral infection, unspecified: Secondary | ICD-10-CM | POA: Diagnosis not present

## 2022-06-24 DIAGNOSIS — Z87891 Personal history of nicotine dependence: Secondary | ICD-10-CM | POA: Diagnosis not present

## 2022-06-24 MED ORDER — PROMETHAZINE-DM 6.25-15 MG/5ML PO SYRP
5.0000 mL | ORAL_SOLUTION | Freq: Four times a day (QID) | ORAL | 0 refills | Status: DC | PRN
Start: 2022-06-24 — End: 2022-09-30

## 2022-06-24 MED ORDER — DOXYCYCLINE HYCLATE 100 MG PO CAPS
100.0000 mg | ORAL_CAPSULE | Freq: Two times a day (BID) | ORAL | 0 refills | Status: AC
Start: 2022-06-26 — End: 2022-07-03

## 2022-06-24 MED ORDER — FLUTICASONE PROPIONATE 50 MCG/ACT NA SUSP
1.0000 | Freq: Every day | NASAL | 0 refills | Status: DC
Start: 2022-06-24 — End: 2023-05-06

## 2022-06-24 NOTE — ED Provider Notes (Addendum)
[facility]    CSN: [id number] Arrival date & time: 06/24/22  1335      History   Chief Complaint Chief Complaint  Patient presents with   Cough   Nasal Congestion    HPI Cassandra [name] is a 22 y.o. female  presents for evaluation of URI symptoms for 5 days. Patient reports associated symptoms of cough, congestion, ear pressure, sinus pressure/pain. Denies N/V/D, sore throat, fevers, body aches, shortness of breath. Patient does not have a hx of asthma or smoking. No known sick contacts.  Pt has taken Sudafed OTC for symptoms. Pt has no other concerns at this time.    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/25/2019   S/P thyroidectomy 01/27/2019   Symptomatic mammary hypertrophy 03/17/2017   Thyroid goiter 05/09/2018   Type 1 diabetes, uncontrolled, 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    Patient Active Problem List   Diagnosis Date Noted   Type 1 diabetes mellitus with hyperglycemia (HCC) 12/12/2021   Endometriosis 09/20/2020   Dysrhythmia 07/23/2020   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   Hypocalcemia 02/07/2019   S/P thyroidectomy 01/27/2019   Post-surgical  hypothyroidism 01/25/2019   Hashimoto's disease 01/2019   Hypomagnesemia    Hypokalemia    Major depressive disorder    MRSA cellulitis    GERD (gastroesophageal reflux disease)    Migraine syndrome 03/17/2017   Symptomatic mammary hypertrophy 03/17/2017   Insomnia 02/17/2017   Generalized anxiety disorder 12/14/2016   Type 1 diabetes, uncontrolled, 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   Hypertension 11/15/2016   Hypoglycemia 11/14/2016   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   Dyslipidemia 04/08/2014    Past Surgical History:  Procedure Laterality Date   ADENOIDECTOMY     CHOLECYSTECTOMY     COLONOSCOPY     ESOPHAGOGASTRODUODENOSCOPY     THYROIDECTOMY N/A 01/25/2019   Procedure: COMPLETE THYROIDECTOMY;  Surgeon: Newman Pies, MD;  Location: [facility] OR;  Service: ENT;  Laterality: N/A;   TOE SURGERY Bilateral    for ingrown toenails   TYMPANOSTOMY TUBE PLACEMENT     WISDOM TOOTH EXTRACTION      OB History     Gravida  0   Para  0   Term  0   Preterm  0   AB  0   Living  0      SAB  0   IAB  0   Ectopic  0   Multiple  0   Live Births  0            Home Medications    Prior to Admission medications   Medication  Sig Start Date End Date Taking? Authorizing Provider  doxycycline (VIBRAMYCIN) 100 MG capsule Take 1 capsule (100 mg total) by mouth 2 (two) times daily for 7 days. 06/26/22 07/03/22 Yes Radford Pax, NP  fluticasone (FLONASE) 50 MCG/ACT nasal spray Place 1 spray into both nostrils daily. 06/24/22  Yes Radford Pax, NP  promethazine-dextromethorphan (PROMETHAZINE-DM) 6.25-15 MG/5ML syrup Take 5 mLs by mouth 4 (four) times daily as needed for cough. 06/24/22  Yes Radford Pax, NP  amitriptyline (ELAVIL) 75 MG tablet Take 1 tablet (75 mg total) by mouth at bedtime. 07/05/20   Verneda Skill, FNP  Blood Glucose Monitoring Suppl (ACCU-CHEK GUIDE) w/Device KIT 1 kit by Does not apply route daily as needed. 05/02/18    [name], MD  buPROPion (WELLBUTRIN XL) 300 MG 24 hr tablet Take 1 tablet (300 mg total) by mouth daily. 06/10/22   Thresa Ross, MD  calcitRIOL (ROCALTROL) 0.5 MCG capsule Take 3 capsules (1.5 mcg total) by mouth every morning. 12/12/21   Shamleffer, Konrad Dolores, MD  calcium acetate (PHOSLO) 667 MG capsule Take by mouth. 02/14/19   [provider]  CALCIUM PO Take 6 tablets by mouth in the morning and at bedtime.    [provider]  cholestyramine (QUESTRAN) 4 g packet Take 1 packet by mouth daily. 10/02/21   [provider]  citalopram (CELEXA) 10 MG tablet Take 1 tablet (10 mg total) by mouth daily. 05/25/22   Thresa Ross, MD  Continuous Blood Gluc Receiver (DEXCOM G6 RECEIVER) DEVI 1 Device by Does not apply route daily as needed. 09/02/20   [name], MD  Continuous Blood Gluc Sensor (DEXCOM G6 SENSOR) MISC CHANGE SENSOR EVERY 10 DAYS AS DIRECTED. 05/06/21   [name], MD  Continuous Blood Gluc Transmit (DEXCOM G6 TRANSMITTER) MISC 1 kit by Does not apply route daily as needed. 08/02/19   [name], MD  DENTA 5000 PLUS 1.1 % CREA dental cream Take by mouth as directed. 09/27/21   [provider]  Glucagon (BAQSIMI TWO PACK) 3 MG/DOSE POWD Place 1 each into the nose as needed (severe hypoglycmia with unresponsiveness). 10/07/21   [name], MD  hydrochlorothiazide (HYDRODIURIL) 50 MG tablet Take 1 tablet (50 mg total) by mouth 2 (two) times daily. 12/12/21   Shamleffer, Konrad Dolores, MD  hydrOXYzine (ATARAX) 10 MG tablet TAKE 1 TABLET BY MOUTH THREE TIMES A DAY AS NEEDED 11/03/21   [name], MD  insulin degludec (TRESIBA) 200 UNIT/ML FlexTouch Pen Inject into the skin.    [provider]  insulin lispro (HUMALOG) 100 UNIT/ML injection INJECT UP TO 300 UNITS IN INSULIN PUMP EVERY 48 HOURS, PER DKA AND HYPERGLYCEMIA PROTOCOLS 03/25/22   Shamleffer, Konrad Dolores, MD  Insulin Syringe-Needle U-100 (INSULIN SYRINGE  .3CC/29GX1/2") 29G X 1/2" 0.3 ML MISC 1 each by Does not apply route 6 (six) times daily. 11/24/17   [name], MD  levocetirizine (XYZAL) 5 MG tablet Take by mouth.    [provider]  levothyroxine (SYNTHROID) 150 MCG tablet Take 1 tablet (150 mcg total) by mouth daily. 12/12/21   Shamleffer, Konrad Dolores, MD  metoprolol tartrate (LOPRESSOR) 50 MG tablet Take 50 mg by mouth 2 (two) times daily. 07/18/21   [provider]  mupirocin ointment (BACTROBAN) 2 %  10/01/21 10/01/22  [provider]  naproxen sodium (ALEVE) 220 MG tablet Take by mouth.    [provider]  nystatin (MYCOSTATIN/NYSTOP) powder Apply topically. 07/10/20   [provider]  Ostomy Supplies (ADHESIVE REMOVER WIPES) MISC Use as directed for pump and CGM sites. 10/07/21   [name], MD  pantoprazole (PROTONIX) 20 MG tablet Take 20 mg by mouth daily. 10/02/21   [provider]  promethazine (PHENERGAN) 12.5 MG tablet Take by mouth. 01/18/20   [provider]  Vitamin D3 (VITAMIN D) 25 MCG tablet Take 1 tablet (1,000 Units total) by mouth daily. 04/22/20   Littie Deeds, MD    Family History Family History  Problem Relation Age of Onset   Hypertension Maternal Grandmother    Anxiety disorder Maternal Grandmother    ADD / ADHD Maternal Grandmother    Diabetes Maternal Grandmother    Hyperlipidemia Maternal Grandfather    Hypothyroidism Maternal Grandfather    Cancer Maternal Grandfather        blood cancer (unknown name)   Hyperlipidemia Mother    Anxiety disorder Mother    Hyperlipidemia Other    Migraines Neg Hx    Seizures Neg Hx    Depression Neg Hx    Autism Neg Hx    Bipolar disorder Neg Hx    Schizophrenia Neg Hx     Social History Social History   Tobacco Use   Smoking status: Former    Packs/day: 0.13    Years: 5.00    Additional pack years: 0.00    Total pack years: 0.65    Types: Cigarettes    Quit date: 01/23/2019    Years  since quitting: 3.4    Passive exposure: Current (mom smokes)   Smokeless tobacco: Never  Vaping Use   Vaping Use: Never used  Substance Use Topics   Alcohol use: No   Drug use: No     Allergies   Fish allergy, Shellfish-derived products, Tape, Augmentin [amoxicillin-pot clavulanate], Lactose intolerance (gi), Tilactase, and Latex   Review of Systems Review of Systems  HENT:  Positive for congestion, ear pain, sinus pressure and sinus pain.   Respiratory:  Positive for cough.      Physical Exam Triage Vital Signs ED Triage Vitals  Enc Vitals Group     BP 06/24/22 1354 119/77     Pulse Rate 06/24/22 1353 95     Resp 06/24/22 1353 18     Temp 06/24/22 1353 98.4 F (36.9 C)     Temp Source 06/24/22 1353 Oral     SpO2 06/24/22 1353 99 %     Weight --      Height --      Head Circumference --      Peak Flow --      Pain Score 06/24/22 1351 7     Pain Loc --      Pain Edu? --      Excl. in GC? --    No data found.  Updated Vital Signs BP 119/77   Pulse 95   Temp 98.4 F (36.9 C) (Oral)   Resp 18   LMP 05/28/2022 (Exact Date)   SpO2 99%   Visual Acuity Right Eye Distance:   Left Eye Distance:   Bilateral Distance:    Right Eye Near:   Left Eye Near:    Bilateral Near:     Physical Exam Vitals and nursing note reviewed.  Constitutional:      General: She is not in acute distress.    Appearance: She is well-developed. She is not ill-appearing.  HENT:     Head: Normocephalic and atraumatic.     Right Ear: Tympanic membrane and  ear canal normal.     Left Ear: Tympanic membrane and ear canal normal.     Nose: Congestion present.     Right Turbinates: Pale.     Left Turbinates: Pale.     Right Sinus: Maxillary sinus tenderness present.     Left Sinus: Maxillary sinus tenderness present.     Mouth/Throat:     Mouth: Mucous membranes are moist.     Pharynx: Oropharynx is clear. Uvula midline. Posterior oropharyngeal erythema present.     Tonsils: No  tonsillar exudate or tonsillar abscesses.  Eyes:     Conjunctiva/sclera: Conjunctivae normal.     Pupils: Pupils are equal, round, and reactive to light.  Cardiovascular:     Rate and Rhythm: Normal rate and regular rhythm.     Heart sounds: Normal heart sounds.  Pulmonary:     Effort: Pulmonary effort is normal.     Breath sounds: Normal breath sounds.  Musculoskeletal:     Cervical back: Normal range of motion and neck supple.  Lymphadenopathy:     Cervical: No cervical adenopathy.  Skin:    General: Skin is warm and dry.  Neurological:     General: No focal deficit present.     Mental Status: She is alert and oriented to person, place, and time.  Psychiatric:        Mood and Affect: Mood normal.        Behavior: Behavior normal.      UC Treatments / Results  Labs (all labs ordered are listed, but only abnormal results are displayed) Labs Reviewed  SARS CORONAVIRUS 2 (TAT 6-24 HRS)   ontains abnormal data Basic metabolic panel Order: [id number] Status: Final result     Visible to patient: Yes (seen)     Next appt: 07/30/2022 at 03:50 PM in Obstetrics and Gynecology Milas Hock, MD)     Dx: Uncontrolled type 1 diabetes mellitus...   0 Result Notes     1 Patient Communication     1 HM Topic          Component Ref Range & Units 6 mo ago (12/11/21) 7 mo ago (11/19/21) 7 mo ago (11/13/21) 7 mo ago (11/03/21) 8 mo ago (10/07/21) 1 yr ago (06/17/21) 1 yr ago (03/18/21)  Glucose 70 - 99 mg/dL 409 High  811 High  914 High  198 High  R, CM 171 High  R, CM 145 High  R, CM 194 High  R, CM  BUN 6 - 20 mg/dL 9 7 8 12  R 13 R 11 R 16 R  Creatinine, Ser 0.57 - 1.00 mg/dL 7.82 9.56 2.13 0.86 R 5.78 R 0.98 High  R 0.88 R  eGFR >59 mL/min/1.73 111        BUN/Creatinine Ratio 9 - 23 12 8  Low  10 SEE NOTE: R, CM SEE NOTE: R, CM 11 R NOT APPLICABLE R  Sodium 134 - 144 mmol/L 138 138 135 140 R 138 R 138 R 138 R  Potassium 3.5 - 5.2 mmol/L 3.8 3.8 4.2 4.2 R 3.8 R 4.0 R 3.7 R   Chloride 96 - 106 mmol/L 100 98 95 Low  105 R 105 R 103 R 103 R  CO2 20 - 29 mmol/L 21 23 24 24  R 22 R 25 R 23 R  Calcium 8.7 - 10.2 mg/dL 8.0 Low  7.9 Low  8.6 Low  8.0 Low  R 8.0 Low  R 9.1 R 7.6 Low  R  Resulting Agency [facility] [place] [facility] [place] [facility] [place] [facility] [place]         Narrative Performed by: Verdell Carmine Performed at:  [address] [facility] [address] [place], [place]  [id number] Lab Director: Jolene Schimke MD, Phone:  [phone]    Specimen Collected: 12/11/21 11:43 Last Resulted: 12/12/21 16:36       EKG   Radiology No results found.  Procedures Procedures (including critical care time)  Medications Ordered in UC Medications - No data to display  Initial Impression / Assessment and Plan / UC Course  I have reviewed the triage vital signs and the nursing notes.  Pertinent labs & imaging results that were available during my care of the patient were reviewed by me and considered in my medical decision making (see chart for details).     Patient is well-appearing and in no acute distress.  Vital signs stable. Start Flonase daily.  Promethazine DM as needed for cough.  Side effect profile reviewed Discussed sinusitis.  Provisional prescription for Doxy sent to pharmacy with instructed to take only if symptoms do not improve or worsen over the next 2 days and patient verbalized understanding Nasal rinses as tolerated Rest and fluids PCP follow-up if symptoms do not improve ER precautions reviewed and patient verbalized understanding Final Clinical Impressions(s) / UC Diagnoses   Final diagnoses:  Acute cough  Viral illness  Acute maxillary sinusitis, recurrence not specified     Discharge Instructions      The clinic will contact you with results of the COVID test done today if positive Start Promethazine DM as needed for cough.  Please note this  medication can make you drowsy.  Do not drink alcohol or drive on this medication Flonase daily A provisional prescription for doxycycline has been provided.  Please do not take it if your symptoms do not improve or worsen over the next 2 days Follow-up with your PCP if your symptoms or not improving Please go to the ER if you develop any worsening symptoms   ED Prescriptions     Medication Sig Dispense Auth. Provider   fluticasone (FLONASE) 50 MCG/ACT nasal spray Place 1 spray into both nostrils daily. 15.8 mL Radford Pax, NP   promethazine-dextromethorphan (PROMETHAZINE-DM) 6.25-15 MG/5ML syrup Take 5 mLs by mouth 4 (four) times daily as needed for cough. 118 mL Radford Pax, NP   doxycycline (VIBRAMYCIN) 100 MG capsule Take 1 capsule (100 mg total) by mouth 2 (two) times daily for 7 days. 14 capsule Radford Pax, NP      PDMP not reviewed this encounter.   Radford Pax, NP 06/24/22 1428    Radford Pax, NP 06/24/22 [phone]

## 2022-06-24 NOTE — ED Triage Notes (Signed)
Pt presents with congestion, runny nose, cough X 5 days.  Denies n/v/d, states she has some ear pressure.  Home interventions: sudafed (no relief)

## 2022-06-24 NOTE — Discharge Instructions (Signed)
The clinic will contact you with results of the COVID test done today if positive Start Promethazine DM as needed for cough.  Please note this medication can make you drowsy.  Do not drink alcohol or drive on this medication Flonase daily A provisional prescription for doxycycline has been provided.  Please do not take it if your symptoms do not improve or worsen over the next 2 days Follow-up with your PCP if your symptoms or not improving Please go to the ER if you develop any worsening symptoms

## 2022-06-25 LAB — SARS CORONAVIRUS 2 (TAT 6-24 HRS): SARS Coronavirus 2: NEGATIVE

## 2022-07-03 ENCOUNTER — Other Ambulatory Visit (HOSPITAL_COMMUNITY): Payer: Self-pay | Admitting: Psychiatry

## 2022-07-03 NOTE — Telephone Encounter (Signed)
buPROPion (WELLBUTRIN XL) 300 MG 24 hr tablet     Take 1 tablet (300 mg total) by mouth daily. Dispense: 30 tablet, Refills: 1 ordered  Last refill on 5/8

## 2022-07-21 ENCOUNTER — Other Ambulatory Visit (HOSPITAL_COMMUNITY): Payer: Self-pay | Admitting: Psychiatry

## 2022-07-22 ENCOUNTER — Telehealth (HOSPITAL_COMMUNITY): Payer: Self-pay | Admitting: *Deleted

## 2022-07-22 ENCOUNTER — Other Ambulatory Visit (HOSPITAL_COMMUNITY): Payer: Self-pay | Admitting: *Deleted

## 2022-07-22 MED ORDER — CITALOPRAM HYDROBROMIDE 10 MG PO TABS: 10.0000 mg | ORAL_TABLET | Freq: Every day | ORAL | 0 refills | Status: DC

## 2022-07-22 NOTE — Telephone Encounter (Signed)
citalopram (CELEXA) 10 MG tablet [[id number]]   Order Details Dose: 10 mg Route: Oral Frequency: Daily  Dispense Quantity: 90 tablet Refills: 0        Sig: Take 1 tablet (10 mg total) by mouth daily.   Last appt--- 04/28/22 Next appt--- 07/28/22

## 2022-07-28 ENCOUNTER — Ambulatory Visit (HOSPITAL_COMMUNITY): Payer: Medicaid Other | Admitting: Psychiatry

## 2022-07-28 NOTE — Progress Notes (Unsigned)
GYNECOLOGY OFFICE VISIT NOTE  History:   Cassandra [name] is a 22 y.o. G0P0000 here today for infertility in the setting of T1DM, HTN, dyslipidemia and chronic pelvic pain concerning for endometriosis.   She has been without contraception for over one year. She started to actively TTC in ***. As of May 2023 she was not actively TTC.   Her last A1C was  8.1 and was 12/2021. Her last TSH on the same day was wnl.   I had recommended PFPT to help with pelvic pain but she has not yet been able to do this.     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/25/2019   S/P thyroidectomy 01/27/2019   Symptomatic mammary hypertrophy 03/17/2017   Thyroid goiter 05/09/2018   Type 1 diabetes, uncontrolled, 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    Past Surgical History:  Procedure Laterality Date   ADENOIDECTOMY     CHOLECYSTECTOMY     COLONOSCOPY     ESOPHAGOGASTRODUODENOSCOPY     THYROIDECTOMY N/A 01/25/2019   Procedure: COMPLETE THYROIDECTOMY;  Surgeon: Newman Pies, MD;  Location: [facility] OR;  Service: ENT;  Laterality: N/A;   TOE SURGERY Bilateral    for ingrown toenails   TYMPANOSTOMY TUBE PLACEMENT     WISDOM TOOTH EXTRACTION      The following  portions of the patient's history were reviewed and updated as appropriate: allergies, current medications, past family history, past medical history, past social history, past surgical history and problem list.   Health Maintenance:   Normal pap and negative HRHPV on ***.   No results found for: "DIAGPAP"   Normal mammogram on ***.   Review of Systems:  Pertinent items noted in HPI and remainder of comprehensive ROS otherwise negative.  Physical Exam:  There were no vitals taken for this visit. CONSTITUTIONAL: Well-developed, well-nourished female in no acute distress.  HEENT:  Normocephalic, atraumatic. External right and left ear normal. No scleral icterus.  NECK: Normal range of motion, supple, no masses noted on observation SKIN: No rash noted. Not diaphoretic. No erythema. No pallor. MUSCULOSKELETAL: Normal range of motion. No edema noted. NEUROLOGIC: Alert and oriented to person, place, and time. Normal muscle tone coordination. No cranial nerve deficit noted. PSYCHIATRIC: Normal mood and affect. Normal behavior. Normal judgment and thought content.  PELVIC: Deferred  Labs and Imaging No results found for this or any previous visit (from the past 168 hour(s)). No results found.  Assessment and Plan:   1. Infertility counseling I would recommend preconception counseling with MFM given her multiple co-morbidities.  I would recommend follow up with Dr. Briscoe Deutscher for probable endometriosis and dx laparoscopy with possible exicision of endometriosis which would help her CPP but  also would improve potential fertility outcomes.   2. Chronic pelvic pain in female See above.     Diagnoses and all orders for this visit:  Infertility counseling  Chronic pelvic pain in female  Cervical cancer screening    Routine preventative health maintenance measures emphasized. Please refer to After Visit Summary for other counseling recommendations.   No follow-ups on file.  Milas Hock, MD, FACOG Obstetrician & Gynecologist, [facility], [facility]

## 2022-07-30 ENCOUNTER — Encounter: Payer: Self-pay | Admitting: Obstetrics and Gynecology

## 2022-07-30 ENCOUNTER — Ambulatory Visit: Payer: Medicaid Other | Admitting: Obstetrics and Gynecology

## 2022-07-30 VITALS — BP 132/87 | HR 112 | Ht 60.0 in | Wt 156.0 lb

## 2022-07-30 DIAGNOSIS — Z3169 Encounter for other general counseling and advice on procreation: Secondary | ICD-10-CM | POA: Diagnosis not present

## 2022-07-30 DIAGNOSIS — N920 Excessive and frequent menstruation with regular cycle: Secondary | ICD-10-CM | POA: Diagnosis not present

## 2022-07-30 DIAGNOSIS — Z124 Encounter for screening for malignant neoplasm of cervix: Secondary | ICD-10-CM

## 2022-07-30 DIAGNOSIS — R102 Pelvic and perineal pain: Secondary | ICD-10-CM | POA: Diagnosis not present

## 2022-07-30 DIAGNOSIS — G8929 Other chronic pain: Secondary | ICD-10-CM

## 2022-07-30 MED ORDER — TRANEXAMIC ACID 650 MG PO TABS
1300.0000 mg | ORAL_TABLET | Freq: Three times a day (TID) | ORAL | 2 refills | Status: DC
Start: 2022-07-30 — End: 2023-05-06

## 2022-07-31 ENCOUNTER — Encounter: Payer: Self-pay | Admitting: Obstetrics and Gynecology

## 2022-07-31 LAB — T4F: T4,Free (Direct): 2.41 ng/dL — ABNORMAL HIGH (ref 0.82–1.77)

## 2022-07-31 LAB — TSH RFX ON ABNORMAL TO FREE T4: TSH: 0.078 u[IU]/mL — ABNORMAL LOW (ref 0.450–4.500)

## 2022-07-31 LAB — HEMOGLOBIN A1C
Est. average glucose Bld gHb Est-mCnc: 180 mg/dL
Hgb A1c MFr Bld: 7.9 % — ABNORMAL HIGH (ref 4.8–5.6)

## 2022-08-04 LAB — ANTI MULLERIAN HORMONE: ANTI-MULLERIAN HORMONE (AMH): 0.762 ng/mL

## 2022-08-05 ENCOUNTER — Encounter: Payer: Self-pay | Admitting: Obstetrics and Gynecology

## 2022-08-07 ENCOUNTER — Encounter (INDEPENDENT_AMBULATORY_CARE_PROVIDER_SITE_OTHER): Payer: Self-pay

## 2022-08-14 ENCOUNTER — Encounter: Payer: Self-pay | Admitting: Obstetrics and Gynecology

## 2022-08-20 ENCOUNTER — Encounter (INDEPENDENT_AMBULATORY_CARE_PROVIDER_SITE_OTHER): Payer: Self-pay

## 2022-08-26 ENCOUNTER — Encounter: Payer: Self-pay | Admitting: Obstetrics and Gynecology

## 2022-08-26 DIAGNOSIS — R102 Pelvic and perineal pain: Secondary | ICD-10-CM

## 2022-08-26 MED ORDER — NAPROXEN 500 MG PO TABS
500.0000 mg | ORAL_TABLET | Freq: Two times a day (BID) | ORAL | 2 refills | Status: DC
Start: 2022-08-26 — End: 2023-05-06

## 2022-09-03 ENCOUNTER — Encounter: Payer: Self-pay | Admitting: Obstetrics and Gynecology

## 2022-09-04 ENCOUNTER — Ambulatory Visit
Admission: RE | Admit: 2022-09-04 | Discharge: 2022-09-04 | Disposition: A | Discharge status: Home / Self Care | Payer: Medicaid Other | Source: Ambulatory Visit | Attending: Internal Medicine | Admitting: Internal Medicine

## 2022-09-04 VITALS — BP 133/77 | HR 116 | Temp 99.0°F | Resp 20

## 2022-09-04 DIAGNOSIS — E1065 Type 1 diabetes mellitus with hyperglycemia: Secondary | ICD-10-CM | POA: Insufficient documentation

## 2022-09-04 DIAGNOSIS — N760 Acute vaginitis: Secondary | ICD-10-CM | POA: Insufficient documentation

## 2022-09-04 DIAGNOSIS — S30814A Abrasion of vagina and vulva, initial encounter: Secondary | ICD-10-CM | POA: Diagnosis not present

## 2022-09-04 MED ORDER — DOXYCYCLINE HYCLATE 100 MG PO CAPS: 100.0000 mg | ORAL_CAPSULE | Freq: Two times a day (BID) | ORAL | 0 refills | Status: AC

## 2022-09-04 MED ORDER — FLUCONAZOLE 150 MG PO TABS: 150.0000 mg | ORAL_TABLET | ORAL | 0 refills | Status: DC

## 2022-09-04 NOTE — Discharge Instructions (Addendum)
Take doxycycline antibiotic every 12 hours for the next 7 days. Apply aquaphor to the wounds as needed to soothe area. Take tylenol or ibuprofen as needed.  If you develop any new or worsening symptoms or if your symptoms do not start to improve, please return here or follow-up with your primary care provider. If your symptoms are severe, please go to the emergency room.

## 2022-09-04 NOTE — ED Provider Notes (Signed)
[facility]    CSN: [id number] Arrival date & time: 09/04/22  1631      History   Chief Complaint Chief Complaint  Patient presents with   Vaginal Itching    I scratched myself accidentally when showering about a week and a half ago and now I have been itching and in excruciating pain especially when trying to urinate. I am a type 1 diabetic and my sugars have also been a bit crazy. - Entered by patient   Vaginal Discharge    HPI Cassandra [name] is a 22 y.o. female.   Patient with history of type 1 diabetes presents to urgent care for evaluation of vaginal injury that happened 1 week ago. Patient was experiencing vaginal itching, scratched the vaginal area with long nails, and this resulted in multiple abrasions to the vaginal area. She has noticed white vaginal discharge over the last 1 week as well. Unsure of any pus drainage from the vaginal abrasions. She is experiencing pain to the external vaginal area with urination, denies urinary frequency, urgency, and hematuria. No fever, chills, N/V, abdominal pain, rash, or recent antibiotic/steroid use. No concern for STD. Continuous blood glucose monitor currently reads 206, non-fasting CBG. States sugars have been slightly higher than normal due to menstrual cycle. Takes insulin as prescribed.   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/25/2019   S/P thyroidectomy 01/27/2019   Symptomatic mammary hypertrophy 03/17/2017   Thyroid goiter 05/09/2018   Type 1 diabetes, uncontrolled, 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    Patient Active Problem List   Diagnosis Date Noted   Type 1 diabetes mellitus with hyperglycemia (HCC) 12/12/2021   Endometriosis 09/20/2020   Dysrhythmia 07/23/2020   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   Hypocalcemia 02/07/2019   S/P thyroidectomy 01/27/2019   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/25/2019   Hashimoto's disease 01/2019   Hypomagnesemia    Hypokalemia    Major depressive disorder    MRSA cellulitis    GERD (gastroesophageal reflux disease)    Migraine syndrome 03/17/2017   Symptomatic mammary hypertrophy 03/17/2017   Insomnia 02/17/2017   Generalized anxiety disorder 12/14/2016   Type 1 diabetes, uncontrolled, 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   Hypertension 11/15/2016   Hypoglycemia 11/14/2016   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   Dyslipidemia 04/08/2014    Past Surgical History:  Procedure Laterality Date   ADENOIDECTOMY     CHOLECYSTECTOMY     COLONOSCOPY     ESOPHAGOGASTRODUODENOSCOPY     THYROIDECTOMY N/A 01/25/2019   Procedure: COMPLETE THYROIDECTOMY;  Surgeon: Newman Pies, MD;  Location: [facility] OR;  Service: ENT;  Laterality: N/A;   TOE SURGERY Bilateral    for ingrown toenails   TYMPANOSTOMY TUBE PLACEMENT     WISDOM TOOTH EXTRACTION      OB History     Gravida  0   Para  0   Term  0   Preterm  0   AB  0   Living  0      SAB  0   IAB  0   Ectopic  0   Multiple  0   Live Births  0            Home Medications    Prior to Admission medications   Medication Sig  Start Date End Date Taking? Authorizing Provider  doxycycline (VIBRAMYCIN) 100 MG capsule Take 1 capsule (100 mg total) by mouth 2 (two) times daily for 7 days. 09/04/22 09/11/22 Yes Carlisle Beers, FNP  fluconazole (DIFLUCAN) 150 MG tablet Take 1 tablet (150 mg total) by mouth every 7 (seven) days. 09/04/22  Yes Carlisle Beers, FNP  amitriptyline (ELAVIL) 75 MG tablet Take 1 tablet (75 mg total) by mouth at bedtime. 07/05/20   Verneda Skill, FNP  Blood Glucose Monitoring Suppl (ACCU-CHEK GUIDE) w/Device KIT 1 kit by Does not apply route daily as needed. 05/02/18   [name], MD  buPROPion (WELLBUTRIN XL) 300 MG 24 hr tablet TAKE 1 TABLET BY MOUTH EVERY DAY 07/22/22   Thresa Ross, MD  calcitRIOL (ROCALTROL) 0.5 MCG capsule Take 3 capsules (1.5 mcg total) by mouth every morning. 12/12/21   Shamleffer, Konrad Dolores, MD  calcium acetate (PHOSLO) 667 MG capsule Take by mouth. 02/14/19   [provider]  CALCIUM PO Take 6 tablets by mouth in the morning and at bedtime.    [provider]  cholestyramine (QUESTRAN) 4 g packet Take 1 packet by mouth daily. 10/02/21   [provider]  citalopram (CELEXA) 10 MG tablet Take 1 tablet (10 mg total) by mouth daily. 07/22/22   Thresa Ross, MD  Continuous Blood Gluc Receiver (DEXCOM G6 RECEIVER) DEVI 1 Device by Does not apply route daily as needed. 09/02/20   [name], MD  Continuous Blood Gluc Sensor (DEXCOM G6 SENSOR) MISC CHANGE SENSOR EVERY 10 DAYS AS DIRECTED. 05/06/21   [name], MD  Continuous Blood Gluc Transmit (DEXCOM G6 TRANSMITTER) MISC 1 kit by Does not apply route daily as needed. 08/02/19   [name], MD  DENTA 5000 PLUS 1.1 % CREA dental cream Take by mouth as directed. 09/27/21   [provider]  fluticasone (FLONASE) 50 MCG/ACT nasal spray Place 1 spray into both nostrils daily. 06/24/22   Radford Pax, NP  Glucagon (BAQSIMI TWO PACK) 3 MG/DOSE POWD Place 1 each into the nose  as needed (severe hypoglycmia with unresponsiveness). 10/07/21   [name], MD  hydrochlorothiazide (HYDRODIURIL) 50 MG tablet Take 1 tablet (50 mg total) by mouth 2 (two) times daily. 12/12/21   Shamleffer, Konrad Dolores, MD  hydrOXYzine (ATARAX) 10 MG tablet TAKE 1 TABLET BY MOUTH THREE TIMES A DAY AS NEEDED 11/03/21   [name], MD  insulin degludec (TRESIBA) 200 UNIT/ML FlexTouch Pen Inject into the skin.    [provider]  insulin lispro (HUMALOG) 100 UNIT/ML injection INJECT UP TO 300 UNITS IN INSULIN PUMP EVERY 48 HOURS, PER DKA AND HYPERGLYCEMIA PROTOCOLS 03/25/22   Shamleffer, Konrad Dolores, MD  Insulin Syringe-Needle U-100 (INSULIN SYRINGE .3CC/29GX1/2") 29G X 1/2" 0.3 ML MISC 1 each by Does not  apply route 6 (six) times daily. 11/24/17   [name], MD  levocetirizine (XYZAL) 5 MG tablet Take by mouth.    [provider]  levothyroxine (SYNTHROID) 150 MCG tablet Take 1 tablet (150 mcg total) by mouth daily. 12/12/21   Shamleffer, Konrad Dolores, MD  metoprolol tartrate (LOPRESSOR) 50 MG tablet Take 50 mg by mouth 2 (two) times daily. 07/18/21   [provider]  mupirocin ointment (BACTROBAN) 2 %  10/01/21 10/01/22  [provider]  naproxen (NAPROSYN) 500 MG tablet Take 1 tablet (500 mg total) by mouth 2 (two) times daily with a meal. As needed for pain 08/26/22   Milas Hock, MD  naproxen sodium (ALEVE) 220 MG tablet Take by mouth.    [provider]  nystatin (MYCOSTATIN/NYSTOP) powder Apply topically. 07/10/20   [provider]  Ostomy Supplies (ADHESIVE REMOVER WIPES) MISC Use as directed for pump and CGM sites. 10/07/21   [name], MD  pantoprazole (PROTONIX) 20 MG tablet Take 20 mg by mouth daily. 10/02/21   [provider]  promethazine (PHENERGAN) 12.5 MG tablet Take by mouth. 01/18/20   [provider]  promethazine-dextromethorphan (PROMETHAZINE-DM) 6.25-15 MG/5ML syrup Take 5 mLs by mouth  4 (four) times daily as needed for cough. 06/24/22   Radford Pax, NP  tranexamic acid (LYSTEDA) 650 MG TABS tablet Take 2 tablets (1,300 mg total) by mouth 3 (three) times daily. Take during menses for a maximum of five days 07/30/22   Milas Hock, MD  Vitamin D3 (VITAMIN D) 25 MCG tablet Take 1 tablet (1,000 Units total) by mouth daily. 04/22/20   Littie Deeds, MD    Family History Family History  Problem Relation Age of Onset   Hypertension Maternal Grandmother    Anxiety disorder Maternal Grandmother    ADD / ADHD Maternal Grandmother    Diabetes Maternal Grandmother    Hyperlipidemia Maternal Grandfather    Hypothyroidism Maternal Grandfather    Cancer Maternal Grandfather        blood cancer (unknown name)   Hyperlipidemia Mother    Anxiety disorder Mother    Hyperlipidemia Other    Migraines Neg Hx    Seizures Neg Hx    Depression Neg Hx    Autism Neg Hx    Bipolar disorder Neg Hx    Schizophrenia Neg Hx     Social History Social History   Tobacco Use   Smoking status: Former    Current packs/day: 0.00    Average packs/day: 0.1 packs/day for 5.0 years (0.7 ttl pk-yrs)    Types: Cigarettes    Start date: 01/22/2014    Quit date: 01/23/2019    Years since quitting: 3.6    Passive exposure: Current (mom smokes)   Smokeless tobacco: Never  Vaping Use   Vaping status: Never Used  Substance Use Topics   Alcohol use: No   Drug use: No     Allergies   Fish allergy, Shellfish-derived products, Tape, Amoxicillin-pot clavulanate, Lactose intolerance (gi), Shellfish allergy, Tilactase, and Latex   Review of Systems Review of Systems  Genitourinary:  Positive for vaginal discharge.  Per HPI   Physical Exam Triage Vital Signs ED Triage Vitals  Encounter Vitals Group     BP 09/04/22 1646 133/77     Systolic BP Percentile --      Diastolic BP Percentile --      Pulse Rate 09/04/22 1646 (!) 116     Resp 09/04/22 1646 20  Temp 09/04/22 1646 99 F (37.2 C)      Temp Source 09/04/22 1646 Oral     SpO2 09/04/22 1646 98 %     Weight --      Height --      Head Circumference --      Peak Flow --      Pain Score 09/04/22 1645 6     Pain Loc --      Pain Education --      Exclude from Growth Chart --    No data found.  Updated Vital Signs BP 133/77 (BP Location: Right Arm)   Pulse (!) 116   Temp 99 F (37.2 C) (Oral)   Resp 20   LMP 08/26/2022   SpO2 98%   Visual Acuity Right Eye Distance:   Left Eye Distance:   Bilateral Distance:    Right Eye Near:   Left Eye Near:    Bilateral Near:     Physical Exam Vitals and nursing note reviewed. Exam conducted with a chaperone present (KP, RN).  Constitutional:      Appearance: She is not ill-appearing or toxic-appearing.  HENT:     Head: Normocephalic and atraumatic.     Right Ear: Hearing and external ear normal.     Left Ear: Hearing and external ear normal.     Nose: Nose normal.     Mouth/Throat:     Lips: Pink.  Eyes:     General: Lids are normal. Vision grossly intact. Gaze aligned appropriately.     Extraocular Movements: Extraocular movements intact.     Conjunctiva/sclera: Conjunctivae normal.  Pulmonary:     Effort: Pulmonary effort is normal.  Genitourinary:    Exam position: Knee-chest position.     Labia:        Right: Lesion present. No rash, tenderness or injury.        Left: Lesion present. No rash, tenderness or injury.     Musculoskeletal:     Cervical back: Neck supple.  Skin:    General: Skin is warm and dry.     Capillary Refill: Capillary refill takes less than 2 seconds.     Findings: No rash.  Neurological:     General: No focal deficit present.     Mental Status: She is alert and oriented to person, place, and time. Mental status is at baseline.     Cranial Nerves: No dysarthria or facial asymmetry.  Psychiatric:        Mood and Affect: Mood normal.        Speech: Speech normal.        Behavior: Behavior normal.        Thought Content:  Thought content normal.        Judgment: Judgment normal.      UC Treatments / Results  Labs (all labs ordered are listed, but only abnormal results are displayed) Labs Reviewed  CERVICOVAGINAL ANCILLARY ONLY    EKG   Radiology No results found.  Procedures Procedures (including critical care time)  Medications Ordered in UC Medications - No data to display  Initial Impression / Assessment and Plan / UC Course  I have reviewed the triage vital signs and the nursing notes.  Pertinent labs & imaging results that were available during my care of the patient were reviewed by me and considered in my medical decision making (see chart for details).   1. Abrasion of vagina with infection, initial encounter At risk for delayed wound  healing and infection given type 1 diabetes with hyperglycemia. Abrasions appear infected, will treat with doxycycline BID for 7 days. May apply topical Aquaphor to soothe site. Diflucan sent to empirically treat for antibiotic induced yeast vaginitis.  Vaginal swab is pending, will treat for other infections based off of vaginal swab results in the next 2 to 3 days. Infection return precautions discussed.   Counseled patient on potential for adverse effects with medications prescribed/recommended today, strict ER and return-to-clinic precautions discussed, patient verbalized understanding.    Final Clinical Impressions(s) / UC Diagnoses   Final diagnoses:  Abrasion of vagina with infection, initial encounter  Type 1 diabetes mellitus with hyperglycemia (HCC)     Discharge Instructions      Take doxycycline antibiotic every 12 hours for the next 7 days. Apply aquaphor to the wounds as needed to soothe area. Take tylenol or ibuprofen as needed.  If you develop any new or worsening symptoms or if your symptoms do not start to improve, please return here or follow-up with your primary care provider. If your symptoms are severe, please go to the  emergency room.     ED Prescriptions     Medication Sig Dispense Auth. Provider   doxycycline (VIBRAMYCIN) 100 MG capsule Take 1 capsule (100 mg total) by mouth 2 (two) times daily for 7 days. 14 capsule Reita May M, FNP   fluconazole (DIFLUCAN) 150 MG tablet Take 1 tablet (150 mg total) by mouth every 7 (seven) days. 2 tablet Carlisle Beers, FNP      PDMP not reviewed this encounter.   Carlisle Beers, [place] 09/04/22 1726

## 2022-09-04 NOTE — ED Triage Notes (Signed)
Pt c/o vaginal itching and pain after scratching vaginal area and c/o vaginal d/c x 1 week-NAD-steady gait

## 2022-09-04 NOTE — ED Notes (Signed)
In with NP for exam

## 2022-09-07 ENCOUNTER — Ambulatory Visit
Admission: RE | Admit: 2022-09-07 | Discharge: 2022-09-07 | Disposition: A | Discharge status: Home / Self Care | Payer: Medicaid Other | Source: Ambulatory Visit | Attending: Family Medicine | Admitting: Family Medicine

## 2022-09-07 VITALS — BP 122/83 | HR 107 | Temp 98.2°F | Resp 18

## 2022-09-07 DIAGNOSIS — N898 Other specified noninflammatory disorders of vagina: Secondary | ICD-10-CM

## 2022-09-07 MED ORDER — ACYCLOVIR 5 % EX OINT: 1.0000 | TOPICAL_OINTMENT | Freq: Three times a day (TID) | CUTANEOUS | 0 refills | Status: DC | PRN

## 2022-09-07 MED ORDER — VALACYCLOVIR HCL 1 G PO TABS: 1000.0000 mg | ORAL_TABLET | Freq: Three times a day (TID) | ORAL | 0 refills | Status: AC

## 2022-09-07 NOTE — ED Triage Notes (Signed)
Pt presents to UC w/ c/o abrasions to vaginal area which she states became ulcers 3 days ago. She states they are very painful and the pain radiates down her legs. Pt has continued to take doxycycline and diflucan for BV and yeast infection. Pt has been applying Aquaphor to ulcerated areas.

## 2022-09-07 NOTE — Discharge Instructions (Signed)
Lab test may take up to 3 to 5 days to result.  Our office will call you once your results are known.  In the meantime I am prescribing antivirals for treatment of your symptoms.  Recommend discontinuing doxycycline as your symptoms have worsened instead of improved.  I have also prescribed you an antiviral ointment to use 3 times daily until symptoms resolve.  Recommend barrier protection chest condoms with sexual contact until lab results are known.

## 2022-09-07 NOTE — ED Provider Notes (Signed)
[facility]    CSN: [id number] Arrival date & time: 09/07/22  1540      History   Chief Complaint Chief Complaint  Patient presents with   Abrasion    I was just here Friday for BV and infection from a scratch on my vagina and pain while urinating. Now I have abrasions on my vagina from all of this. I have been taking the antibiotic and using aquaphor and no change. Actually getting worse - Entered by patient    HPI Cassandra [name] is a 22 y.o. female.   HPI Patient presents today for evaluation of  multiple painful lesion on both labia. She was seen here at [facility] on 09/04/2022 and treated with antibiotics as she had a painful lesion which she associated with a scratch injury she sustained while showering. She noticed over the last few days despite taking antibiotics the lesions have increased and are painful.  She characterizes the pain as burning and itching. She denies any concern for STD as she has been with the same partner for 2 years. She denies any knowledge of prior diagnosis of HSV. Denies any other concerns. Patient has a vaginal cytology swab pending.   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/25/2019   S/P  thyroidectomy 01/27/2019   Symptomatic mammary hypertrophy 03/17/2017   Thyroid goiter 05/09/2018   Type 1 diabetes, uncontrolled, 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    Patient Active Problem List   Diagnosis Date Noted   Type 1 diabetes mellitus with hyperglycemia (HCC) 12/12/2021   Endometriosis 09/20/2020   Dysrhythmia 07/23/2020   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   Hypocalcemia 02/07/2019   S/P thyroidectomy 01/27/2019   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/25/2019   Hashimoto's disease 01/2019   Hypomagnesemia    Hypokalemia    Major depressive disorder    MRSA cellulitis    GERD (gastroesophageal reflux disease)    Migraine syndrome 03/17/2017   Symptomatic mammary hypertrophy 03/17/2017   Insomnia 02/17/2017   Generalized anxiety disorder 12/14/2016   Type 1 diabetes, uncontrolled, 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   Hypertension 11/15/2016   Hypoglycemia 11/14/2016   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   Dyslipidemia 04/08/2014    Past Surgical History:  Procedure Laterality Date   ADENOIDECTOMY     CHOLECYSTECTOMY     COLONOSCOPY     ESOPHAGOGASTRODUODENOSCOPY     THYROIDECTOMY N/A 01/25/2019   Procedure: COMPLETE THYROIDECTOMY;  Surgeon: Newman Pies, MD;  Location: [facility] OR;  Service: ENT;  Laterality: N/A;   TOE SURGERY Bilateral    for ingrown toenails   TYMPANOSTOMY TUBE PLACEMENT     WISDOM TOOTH EXTRACTION      OB  History     Gravida  0   Para  0   Term  0   Preterm  0   AB  0   Living  0      SAB  0   IAB  0   Ectopic  0   Multiple  0   Live Births  0            Home Medications    Prior to Admission medications   Medication Sig Start Date End Date Taking? Authorizing Provider  acyclovir ointment (ZOVIRAX) 5 % Apply 1 Application topically 3 (three) times daily as needed (vaginal lesions and vaginal bumps). 09/07/22  Yes Bing Neighbors, NP  valACYclovir (VALTREX) 1000 MG tablet Take 1  tablet (1,000 mg total) by mouth 3 (three) times daily for 14 days. 09/07/22 09/21/22 Yes Bing Neighbors, NP  amitriptyline (ELAVIL) 75 MG tablet Take 1 tablet (75 mg total) by mouth at bedtime. 07/05/20   Verneda Skill, FNP  Blood Glucose Monitoring Suppl (ACCU-CHEK GUIDE) w/Device KIT 1 kit by Does not apply route daily as needed. 05/02/18   [name], MD  buPROPion (WELLBUTRIN XL) 300 MG 24 hr tablet TAKE 1 TABLET BY MOUTH EVERY DAY 07/22/22   Thresa Ross, MD  calcitRIOL (ROCALTROL) 0.5 MCG capsule Take 3 capsules (1.5 mcg total) by mouth every morning. 12/12/21   Shamleffer, Konrad Dolores, MD  calcium acetate (PHOSLO) 667 MG capsule Take by mouth. 02/14/19   [provider]  CALCIUM PO Take 6 tablets by mouth in the morning and at bedtime.    [provider]  cholestyramine (QUESTRAN) 4 g packet Take 1 packet by mouth daily. 10/02/21   [provider]  citalopram (CELEXA) 10 MG tablet Take 1 tablet (10 mg total) by mouth daily. 07/22/22   Thresa Ross, MD  Continuous Blood Gluc Receiver (DEXCOM G6 RECEIVER) DEVI 1 Device by Does not apply route daily as needed. 09/02/20   [name], MD  Continuous Blood Gluc Sensor (DEXCOM G6 SENSOR) MISC CHANGE SENSOR EVERY 10 DAYS AS DIRECTED. 05/06/21   [name], MD  Continuous Blood Gluc Transmit (DEXCOM G6 TRANSMITTER) MISC 1 kit by Does not apply route daily as needed. 08/02/19   [name], MD  DENTA 5000 PLUS 1.1 % CREA dental cream Take by mouth as directed. 09/27/21   [provider]  doxycycline (VIBRAMYCIN) 100 MG capsule Take 1 capsule (100 mg total) by mouth 2 (two) times daily for 7 days. 09/04/22 09/11/22  Carlisle Beers, FNP  fluconazole (DIFLUCAN) 150 MG tablet Take 1 tablet (150 mg total) by mouth every 7 (seven) days. 09/04/22   Carlisle Beers, FNP  fluticasone (FLONASE) 50 MCG/ACT nasal spray Place 1 spray into both nostrils daily. 06/24/22   Radford Pax, NP  Glucagon  (BAQSIMI TWO PACK) 3 MG/DOSE POWD Place 1 each into the nose as needed (severe hypoglycmia with unresponsiveness). 10/07/21   [name], MD  hydrochlorothiazide (HYDRODIURIL) 50 MG tablet Take 1 tablet (50 mg total) by mouth 2 (two) times daily. 12/12/21   Shamleffer, Konrad Dolores, MD  hydrOXYzine (ATARAX) 10 MG tablet TAKE 1 TABLET BY MOUTH THREE TIMES A DAY AS NEEDED 11/03/21   [name], MD  insulin degludec (TRESIBA) 200 UNIT/ML FlexTouch Pen Inject into the skin.    [provider]  insulin lispro (HUMALOG) 100 UNIT/ML injection INJECT UP TO 300 UNITS IN INSULIN PUMP EVERY 48 HOURS, PER DKA AND HYPERGLYCEMIA PROTOCOLS  03/25/22   Shamleffer, Konrad Dolores, MD  Insulin Syringe-Needle U-100 (INSULIN SYRINGE .3CC/29GX1/2") 29G X 1/2" 0.3 ML MISC 1 each by Does not apply route 6 (six) times daily. 11/24/17   [name], MD  levocetirizine (XYZAL) 5 MG tablet Take by mouth.    [provider]  levothyroxine (SYNTHROID) 150 MCG tablet Take 1 tablet (150 mcg total) by mouth daily. 12/12/21   Shamleffer, Konrad Dolores, MD  metoprolol tartrate (LOPRESSOR) 50 MG tablet Take 50 mg by mouth 2 (two) times daily. 07/18/21   [provider]  mupirocin ointment (BACTROBAN) 2 %  10/01/21 10/01/22  [provider]  naproxen (NAPROSYN) 500 MG tablet Take 1 tablet (500 mg total) by mouth 2 (two) times daily with a meal. As needed for pain 08/26/22   Milas Hock, MD  naproxen sodium (ALEVE) 220 MG tablet Take by mouth.    [provider]  nystatin (MYCOSTATIN/NYSTOP) powder Apply topically. 07/10/20   [provider]  Ostomy Supplies (ADHESIVE REMOVER WIPES) MISC Use as directed for pump and CGM sites. 10/07/21   [name], MD  pantoprazole (PROTONIX) 20 MG tablet Take 20 mg by mouth daily. 10/02/21   [provider]  promethazine (PHENERGAN) 12.5 MG tablet Take by mouth. 01/18/20   [provider]   promethazine-dextromethorphan (PROMETHAZINE-DM) 6.25-15 MG/5ML syrup Take 5 mLs by mouth 4 (four) times daily as needed for cough. 06/24/22   Radford Pax, NP  tranexamic acid (LYSTEDA) 650 MG TABS tablet Take 2 tablets (1,300 mg total) by mouth 3 (three) times daily. Take during menses for a maximum of five days 07/30/22   Milas Hock, MD  Vitamin D3 (VITAMIN D) 25 MCG tablet Take 1 tablet (1,000 Units total) by mouth daily. 04/22/20   Littie Deeds, MD    Family History Family History  Problem Relation Age of Onset   Hypertension Maternal Grandmother    Anxiety disorder Maternal Grandmother    ADD / ADHD Maternal Grandmother    Diabetes Maternal Grandmother    Hyperlipidemia Maternal Grandfather    Hypothyroidism Maternal Grandfather    Cancer Maternal Grandfather        blood cancer (unknown name)   Hyperlipidemia Mother    Anxiety disorder Mother    Hyperlipidemia Other    Migraines Neg Hx    Seizures Neg Hx    Depression Neg Hx    Autism Neg Hx    Bipolar disorder Neg Hx    Schizophrenia Neg Hx     Social History Social History   Tobacco Use   Smoking status: Former    Current packs/day: 0.00    Average packs/day: 0.1 packs/day for 5.0 years (0.7 ttl pk-yrs)    Types: Cigarettes    Start date: 01/22/2014    Quit date: 01/23/2019    Years since quitting: 3.6    Passive exposure: Current (mom smokes)   Smokeless tobacco: Never  Vaping Use   Vaping status: Never Used  Substance Use Topics   Alcohol use: No   Drug use: No     Allergies   Fish allergy, Shellfish-derived products, Tape, Amoxicillin-pot clavulanate, Lactose intolerance (gi), Shellfish allergy, Tilactase, and Latex   Review of Systems Review of Systems Pertinent negatives listed in HPI   Physical Exam Triage Vital Signs ED Triage Vitals  Encounter Vitals Group     BP 09/07/22 1553 122/83     Systolic BP Percentile --      Diastolic BP Percentile --  Pulse Rate 09/07/22 1553 (!) 107      Resp 09/07/22 1553 18     Temp 09/07/22 1553 98.2 F (36.8 C)     Temp Source 09/07/22 1553 Oral     SpO2 09/07/22 1553 98 %     Weight --      Height --      Head Circumference --      Peak Flow --      Pain Score 09/07/22 1606 7     Pain Loc --      Pain Education --      Exclude from Growth Chart --    No data found.  Updated Vital Signs BP 122/83 (BP Location: Left Arm)   Pulse (!) 107   Temp 98.2 F (36.8 C) (Oral)   Resp 18   LMP 08/26/2022   SpO2 98%   Visual Acuity Right Eye Distance:   Left Eye Distance:   Bilateral Distance:    Right Eye Near:   Left Eye Near:    Bilateral Near:     Physical Exam Vitals and nursing note reviewed. Exam conducted with a chaperone present.  HENT:     Head: Normocephalic and atraumatic.  Cardiovascular:     Rate and Rhythm: Regular rhythm. Tachycardia present.     Pulses: Normal pulses.     Heart sounds: Normal heart sounds.  Genitourinary:    Exam position: Supine.     Labia:        Right: Tenderness and lesion present.        Left: Tenderness and lesion present.      Comments: Lesions (vesicular) present within perineum area Neurological:     General: No focal deficit present.     Mental Status: She is alert.      UC Treatments / Results  Labs (all labs ordered are listed, but only abnormal results are displayed) Labs Reviewed  HSV CULTURE AND TYPING    EKG   Radiology No results found.  Procedures Procedures (including critical care time)  Medications Ordered in UC Medications - No data to display  Initial Impression / Assessment and Plan / UC Course  I have reviewed the triage vital signs and the nursing notes.  Pertinent labs & imaging results that were available during my care of the patient were reviewed by me and considered in my medical decision making (see chart for details).   Given appearance and distribution of vaginal lesion, concern for possible HSV. Collected HSV culture/typing,  pending.  Will treat as an HSV initial outbreak while awaiting culture results.  Patient encouraged to use barrier protection given she is currently symptomatic.  Patient provided with written literature regarding HSV. Patient aware that our office will only call if results are abnormal. Final Clinical Impressions(s) / UC Diagnoses   Final diagnoses:  Vaginal lesion     Discharge Instructions      Lab test may take up to 3 to 5 days to result.  Our office will call you once your results are known.  In the meantime I am prescribing antivirals for treatment of your symptoms.  Recommend discontinuing doxycycline as your symptoms have worsened instead of improved.  I have also prescribed you an antiviral ointment to use 3 times daily until symptoms resolve.  Recommend barrier protection chest condoms with sexual contact until lab results are known.   ED Prescriptions     Medication Sig Dispense Auth. Provider   valACYclovir (VALTREX) 1000 MG tablet Take  1 tablet (1,000 mg total) by mouth 3 (three) times daily for 14 days. 42 tablet Bing Neighbors, NP   acyclovir ointment (ZOVIRAX) 5 % Apply 1 Application topically 3 (three) times daily as needed (vaginal lesions and vaginal bumps). 60 g Bing Neighbors, NP      PDMP not reviewed this encounter.   Bing Neighbors, NP 09/08/22 1336

## 2022-09-11 ENCOUNTER — Telehealth: Payer: Self-pay | Admitting: Internal Medicine

## 2022-09-11 NOTE — Telephone Encounter (Signed)
Please defer disability forms to her PCP.  Says the patient has been on disability since January 2024.   Last time I saw her was November, patient needs to follow-up with PCP

## 2022-09-11 NOTE — Telephone Encounter (Signed)
I spoke to [facility] and she is aware to follow up with PCP with her disability forms

## 2022-09-17 ENCOUNTER — Telehealth (HOSPITAL_COMMUNITY): Payer: Self-pay | Admitting: *Deleted

## 2022-09-17 MED ORDER — BUPROPION HCL ER (XL) 300 MG PO TB24: 300.0000 mg | ORAL_TABLET | Freq: Every day | ORAL | 0 refills | Status: DC

## 2022-09-17 NOTE — Addendum Note (Signed)
Addended by: Thresa Ross on: 09/17/2022 10:56 AM   Modules accepted: Orders

## 2022-09-17 NOTE — Telephone Encounter (Signed)
90 DAY Rx Refill Request  [id number] - [place], [place] - [address]   Disp Refills Start End   buPROPion (WELLBUTRIN XL) 300 MG 24 hr tablet 30 tablet 0 07/22/2022 --   Sig - Route: TAKE 1 TABLET  BY MOUTH EVERY DAY     NEXT APPT --09/30/22 LAST APPT -- 04/08/22  N/S 07/28/22

## 2022-09-30 ENCOUNTER — Ambulatory Visit: Payer: Medicaid Other | Admitting: Obstetrics and Gynecology

## 2022-09-30 ENCOUNTER — Other Ambulatory Visit (HOSPITAL_COMMUNITY)
Admission: RE | Admit: 2022-09-30 | Discharge: 2022-09-30 | Disposition: A | Discharge status: Home / Self Care | Payer: Medicaid Other | Source: Ambulatory Visit | Attending: Obstetrics and Gynecology | Admitting: Obstetrics and Gynecology

## 2022-09-30 ENCOUNTER — Encounter: Payer: Self-pay | Admitting: Obstetrics and Gynecology

## 2022-09-30 VITALS — BP 120/79 | HR 88 | Resp 16 | Ht 60.0 in | Wt 161.0 lb

## 2022-09-30 DIAGNOSIS — Z01419 Encounter for gynecological examination (general) (routine) without abnormal findings: Secondary | ICD-10-CM

## 2022-09-30 NOTE — Progress Notes (Signed)
   ANNUAL EXAM Patient name: Cassandra [name] MRN [id number]  Date of birth: 02-Sep-2000 Chief Complaint:   Annual Exam  History of Present Illness:   Cassandra [name] is a 22 y.o. G0P0000 female being seen today for a routine annual exam.   Current concerns: None. Currently holding on REI due to cost. SA was abnormal but still with good counts. She will go for D3 bloodwork with next cycle. AMH was 0.762.   Current birth control: TTC  Patient's last menstrual period was 09/26/2022.   Last Pap/Pap History: None   Health Maintenance Due  Topic Date Due   OPHTHALMOLOGY EXAM  Never done   PAP-Cervical Cytology Screening  Never done   PAP SMEAR-Modifier  Never done   COVID-19 Vaccine (2 - 2023-24 season) 10/03/2021   INFLUENZA VACCINE  09/03/2022    Review of Systems:   Pertinent items are noted in HPI Denies any headaches, blurred vision, fatigue, shortness of breath, chest pain, abdominal pain, abnormal vaginal discharge/itching/odor/irritation, problems with periods, bowel movements, urination, or intercourse unless otherwise stated above.  Pertinent History Reviewed:  Reviewed past medical,surgical, social and family history.  Reviewed problem list, medications and allergies. Physical Assessment:   Vitals:   09/30/22 1545  BP: 120/79  Pulse: 88  Resp: 16  Weight: 161 lb (73 kg)  Height: 5' (1.524 m)  Body mass index is 31.44 kg/m.   Physical Examination:  General appearance - well appearing, and in no distress Mental status - alert, oriented to person, place, and time Psych:  She has a normal mood and affect Skin - warm and dry, normal color, no suspicious lesions noted Chest - effort normal Heart - normal rate  Breasts - breasts appear normal, no suspicious masses, no skin or nipple changes or axillary nodes Abdomen - soft, nontender, nondistended, no masses or organomegaly Pelvic -  VULVA: normal appearing vulva with no masses, tenderness or lesions   VAGINA: normal appearing vagina with normal color and discharge, no lesions  CERVIX: normal appearing cervix without discharge or lesions, no CMT UTERUS: uterus is felt to be normal size, shape, consistency and nontender  ADNEXA: No adnexal masses or tenderness noted. Extremities:  No swelling or varicosities noted  Chaperone present for exam  No results found for this or any previous visit (from the past 24 hour(s)).  Assessment & Plan:  Cassandra [name] was seen today for annual exam.  Diagnoses and all orders for this visit:  Encounter for annual routine gynecological examination -     Cytology - PAP( [place]) -     FSH -     Estradiol  - Cervical cancer screening: Discussed screening Q3 years. Reviewed importance of annual exams and limits of pap smear. Pap with reflex HPV done - GC/CT: Discussed and recommended. Pt  declines - Gardasil: completed - Birth Control:  TTC - Breast Health: Encouraged self breast awareness/exams. Teaching provided. - Follow-up: 12 months and prn     Orders Placed This Encounter  Procedures   FSH   Estradiol    Meds: No orders of the defined types were placed in this encounter.   Follow-up: No follow-ups on file.  Milas Hock, MD 09/30/2022 4:27 PM

## 2022-10-08 LAB — CYTOLOGY - PAP: Diagnosis: NEGATIVE

## 2022-10-27 ENCOUNTER — Encounter: Payer: Self-pay | Admitting: Obstetrics and Gynecology

## 2022-10-27 ENCOUNTER — Encounter: Payer: Self-pay | Admitting: Internal Medicine

## 2022-10-28 ENCOUNTER — Encounter: Payer: Self-pay | Admitting: Obstetrics and Gynecology

## 2022-10-28 LAB — ESTRADIOL: Estradiol: 23.3 pg/mL

## 2022-10-28 LAB — FOLLICLE STIMULATING HORMONE: FSH: 7.1 m[IU]/mL

## 2022-10-29 ENCOUNTER — Encounter (HOSPITAL_COMMUNITY): Payer: Self-pay | Admitting: Psychiatry

## 2022-10-29 ENCOUNTER — Ambulatory Visit (INDEPENDENT_AMBULATORY_CARE_PROVIDER_SITE_OTHER): Payer: Medicaid Other | Admitting: Psychiatry

## 2022-10-29 DIAGNOSIS — G4710 Hypersomnia, unspecified: Secondary | ICD-10-CM

## 2022-10-29 DIAGNOSIS — F411 Generalized anxiety disorder: Secondary | ICD-10-CM | POA: Diagnosis not present

## 2022-10-29 DIAGNOSIS — F331 Major depressive disorder, recurrent, moderate: Secondary | ICD-10-CM

## 2022-10-29 MED ORDER — CITALOPRAM HYDROBROMIDE 20 MG PO TABS: 20.0000 mg | ORAL_TABLET | Freq: Every day | ORAL | 1 refills | Status: DC

## 2022-10-29 MED ORDER — BUPROPION HCL ER (XL) 300 MG PO TB24: 300.0000 mg | ORAL_TABLET | Freq: Every day | ORAL | 0 refills | Status: DC

## 2022-10-29 NOTE — Progress Notes (Signed)
[facility] Follow up visit  Patient Identification: Cassandra [name] MRN:  [id number] Date of Evaluation:  10/29/2022 Referral Source: primary care Chief Complaint:   No chief complaint on file. Follow up depression  Visit Diagnosis:    ICD-10-CM   1. MDD (major depressive disorder), recurrent episode, moderate (HCC)  F33.1     2. Generalized anxiety disorder  F41.1     3. Excessive sleepiness  G47.10     Depression, anxiety   History of Present Illness: Patient is a 22 years old currently single Caucasian female was living with her mom he she just started her work with [employer] initially referred by primary care physician to establish care for depression and anxiety  Patient gives a history of having anxiety when younger diagnosed with insulin-dependent diabetes in sixth grade   Dad passed 4 months ago in [place], she is going thru grief, not at [employer] as his dad used to work there so taking a break to avoid memories Supportive BF   Also has  including low calcium surgery of parathyroid hormones along with thyroid ectomy.   Aggravating factor: dad's death, Grand PA death few years ago, job can be , co morbid medical conditions inlcuding IDDM, hypothyroid Modifying factor: family, dog  Duration more then 4 years  Severity ; depressed   Past Psychiatric History: depression, anxiety  Previous Psychotropic Medications: Yes  Prozac, cymbalta Substance Abuse History in the last 12 months:  No.  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/25/2019   S/P thyroidectomy 01/27/2019   Symptomatic mammary hypertrophy 03/17/2017   Thyroid goiter 05/09/2018   Type 1 diabetes, uncontrolled, 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    Past Surgical History:  Procedure Laterality Date   ADENOIDECTOMY     CHOLECYSTECTOMY     COLONOSCOPY     ESOPHAGOGASTRODUODENOSCOPY     THYROIDECTOMY N/A 01/25/2019   Procedure: COMPLETE THYROIDECTOMY;  Surgeon: Newman Pies, MD;  Location: [facility] OR;  Service: ENT;  Laterality: N/A;   TOE SURGERY Bilateral    for ingrown toenails   TYMPANOSTOMY TUBE PLACEMENT     WISDOM TOOTH EXTRACTION      Family Psychiatric History: grand pa had depression, mom has anxiety  Family History:  Family History  Problem Relation Age of Onset   Hypertension Maternal Grandmother    Anxiety disorder Maternal Grandmother    ADD / ADHD Maternal Grandmother    Diabetes Maternal Grandmother    Hyperlipidemia Maternal Grandfather    Hypothyroidism Maternal Grandfather    Cancer Maternal Grandfather        blood cancer (unknown name)   Hyperlipidemia Mother    Anxiety disorder  Mother    Hyperlipidemia Other    Migraines Neg Hx    Seizures Neg Hx    Depression Neg Hx    Autism Neg Hx    Bipolar disorder Neg Hx    Schizophrenia Neg Hx     Social History:   Social History   Socioeconomic History   Marital status: Single    Spouse name: Not on file   Number of children: Not on file   Years of education: 12   Highest education level: High school graduate  Occupational History   Not on file  Tobacco Use   Smoking status: Former    Current packs/day: 0.00     Average packs/day: 0.1 packs/day for 5.0 years (0.7 ttl pk-yrs)    Types: Cigarettes    Start date: 01/22/2014    Quit date: 01/23/2019    Years since quitting: 3.7    Passive exposure: Current (mom smokes)   Smokeless tobacco: Never  Vaping Use   Vaping status: Never Used  Substance and Sexual Activity   Alcohol use: No   Drug use: No   Sexual activity: Not Currently    Birth control/protection: None  Other Topics Concern   Not on file  Social History Narrative   Pt lives with mother. Mother is a Midwife.  They have two dogs and three cats. Patient is graduate from [employer]; Pt enjoys softball, sleep, and be with family.    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    Social Determinants of Health   Financial Resource Strain: Low Risk  (05/06/2022)   Received from [employer], [facility]   Overall Financial Resource Strain (CARDIA)    Difficulty of Paying Living Expenses: Not hard at all  Food Insecurity: No Food Insecurity (05/06/2022)   Received from [facility], [facility]   Hunger Vital Sign    Worried About Running Out of Food in the Last Year: Never true    Ran Out of Food in the Last Year: Never true  Transportation Needs: No Transportation Needs (05/06/2022)   Received from [facility], [facility]   PRAPARE - Transportation    Lack of Transportation (Medical): No    Lack of Transportation (Non-Medical): No  Physical Activity: Not on file  Stress: No Stress Concern Present (10/24/2021)   Received from [facility], [facility] - Occupational Stress Questionnaire    Feeling of Stress : Not at all  Social Connections: Unknown (06/01/2021)   Received from [facility], [facility]   Social Network    Social Network: Not on file    Allergies:   Allergies  Allergen Reactions   Fish Allergy Diarrhea and Other (See Comments)    Shellfish-Derived Products Diarrhea and Nausea And Vomiting   Tape Itching    Medical tape causes itching   Amoxicillin-Pot Clavulanate Nausea And Vomiting and Other (See Comments)   Lactose Intolerance (Gi)    Shellfish Allergy Nausea And Vomiting and Other (See Comments)   Tilactase Nausea And Vomiting   Latex Rash    Metabolic Disorder Labs: Lab Results  Component Value Date   HGBA1C 7.9 (H) 07/30/2022   MPG 174 08/27/2020   MPG 182.9 01/10/2018   Lab Results  Component Value Date   PROLACTIN 12.3 05/22/2020   Lab Results  Component Value Date   CHOL 141 03/18/2021   TRIG 158 (H) 03/18/2021  HDL 45 (L) 03/18/2021   CHOLHDL 3.1 03/18/2021   LDLCALC 72 03/18/2021   LDLCALC 120 (H) 11/28/2018   Lab Results  Component Value Date   TSH 0.078 (L) 07/30/2022    Therapeutic Level Labs: No results found for: "LITHIUM" No results found for: "CBMZ" No results found for: "VALPROATE"  Current Medications: Current Outpatient Medications  Medication Sig Dispense Refill   acyclovir ointment (ZOVIRAX) 5 % Apply 1 Application topically 3 (three) times daily as needed (vaginal lesions and vaginal bumps). 60 g 0   amitriptyline (ELAVIL) 75 MG tablet Take 1 tablet (75 mg total) by mouth at bedtime. 90 tablet 1   Blood Glucose Monitoring Suppl (ACCU-CHEK GUIDE) w/Device KIT 1 kit by Does not apply route daily as needed. 2 kit 5   buPROPion (WELLBUTRIN XL) 300 MG 24 hr tablet Take 1 tablet (300 mg total) by mouth daily. 30 tablet 0   calcitRIOL (ROCALTROL) 0.5 MCG capsule Take 3 capsules (1.5 mcg total) by mouth every morning. 270 capsule 3   calcium acetate (PHOSLO) 667 MG capsule Take by mouth.     CALCIUM PO Take 6 tablets by mouth in the morning and at bedtime.     citalopram (CELEXA) 20 MG tablet Take 1 tablet (20 mg total) by mouth daily. 30 tablet 1   Continuous Blood Gluc Receiver (DEXCOM G6 RECEIVER) DEVI 1 Device by Does not apply route daily as needed. 1 each 0    Continuous Blood Gluc Sensor (DEXCOM G6 SENSOR) MISC CHANGE SENSOR EVERY 10 DAYS AS DIRECTED. 3 each 5   Continuous Blood Gluc Transmit (DEXCOM G6 TRANSMITTER) MISC 1 kit by Does not apply route daily as needed. 1 each 1   fluticasone (FLONASE) 50 MCG/ACT nasal spray Place 1 spray into both nostrils daily. 15.8 mL 0   Glucagon (BAQSIMI TWO PACK) 3 MG/DOSE POWD Place 1 each into the nose as needed (severe hypoglycmia with unresponsiveness). 1 each 3   hydrOXYzine (ATARAX) 10 MG tablet TAKE 1 TABLET BY MOUTH THREE TIMES A DAY AS NEEDED (Patient not taking: Reported on 09/30/2022) 270 tablet 2   insulin degludec (TRESIBA) 200 UNIT/ML FlexTouch Pen Inject into the skin.     insulin lispro (HUMALOG) 100 UNIT/ML injection INJECT UP TO 300 UNITS IN INSULIN PUMP EVERY 48 HOURS, PER DKA AND HYPERGLYCEMIA PROTOCOLS 40 mL 5   Insulin Syringe-Needle U-100 (INSULIN SYRINGE .3CC/29GX1/2") 29G X 1/2" 0.3 ML MISC 1 each by Does not apply route 6 (six) times daily. 200 each 6   levocetirizine (XYZAL) 5 MG tablet Take by mouth.     levothyroxine (SYNTHROID) 150 MCG tablet Take 1 tablet (150 mcg total) by mouth daily. 90 tablet 3   metoprolol tartrate (LOPRESSOR) 50 MG tablet Take 50 mg by mouth 2 (two) times daily.     naproxen (NAPROSYN) 500 MG tablet Take 1 tablet (500 mg total) by mouth 2 (two) times daily with a meal. As needed for pain 60 tablet 2   nystatin (MYCOSTATIN/NYSTOP) powder Apply topically.     Ostomy Supplies (ADHESIVE REMOVER WIPES) MISC Use as directed for pump and CGM sites. 50 each 3   pantoprazole (PROTONIX) 20 MG tablet Take 20 mg by mouth daily.     promethazine (PHENERGAN) 12.5 MG tablet Take by mouth.     tranexamic acid (LYSTEDA) 650 MG TABS tablet Take 2 tablets (1,300 mg total) by mouth 3 (three) times daily. Take during menses for a maximum of five days 30 tablet 2  Vitamin D3 (VITAMIN D) 25 MCG tablet Take 1 tablet (1,000 Units total) by mouth daily. 60 tablet 0   No current  facility-administered medications for this visit.     Psychiatric Specialty Exam: Review of Systems  Cardiovascular:  Negative for chest pain.  Neurological:  Negative for tremors.  Psychiatric/Behavioral:  Positive for dysphoric mood.     Last menstrual period 09/26/2022.There is no height or weight on file to calculate BMI.  General Appearance: Casual  Eye Contact:  Fair  Speech:  Slow  Volume:  Normal  Mood: subdued  Affect:  Constricted  Thought Process:  Goal Directed  Orientation:  Full (Time, Place, and Person)  Thought Content:  Rumination  Suicidal Thoughts:  No  Homicidal Thoughts:  No  Memory:  Immediate;   Fair  Judgement:  Fair  Insight:  Fair  Psychomotor Activity:  Decreased  Concentration:  Concentration: Fair  Recall:  Fair  Fund of Knowledge:Good  Language: Good  Akathisia:  No  Handed:    AIMS (if indicated):  not done  Assets:  Desire for Improvement Social Support  ADL's:  Intact  Cognition: WNL  Sleep:   excessive   Screenings: GAD-7    Flowsheet Row Office Visit from 05/22/2020 in [facility] Office Visit from 12/18/2019 in [facility] Office Visit from 04[facility]13/2019 in [facility] Office Visit from 12[facility]28/2018 in [facility] Office Visit from 08/16/2018 in [name]  Total GAD-7 Score 11 12 17 10 17       PHQ2-9    Flowsheet Row Office Visit from 12/09/2021 in [facility] Office Visit from 05/22/2020 in [facility] Office Visit from 12/18/2019 in [facility] Office Visit from 04/13/2019 in [facility] Office Visit from 12/28/2018 in [facility]  PHQ-2 Total Score 2 1 1 3 3   PHQ-9 Total Score 11 7 10 15 8       Flowsheet Row ED from 09/07/2022 in [facility] [address] [facility] ED from 09/04/2022 in [facility] ED from 06/24/2022 in [facility])  C-SSRS RISK CATEGORY No Risk No Risk No Risk       Assessment and Plan: as follows  Prior documentation reviewed   MDD recurrent moderate: subdued, increse celexa to 20mg  Continue wellbutrin Grief: provided supportive therapy, discussed to add activities to keep mind busy increase celexa  GAD: gets anxious, increase celexa to 20mg    Sleepiness: manageable, continue sleep hygiene Fu 11m. Direct care time spent 120 minutes  including chart review and documentation  Collaboration of Care: Primary Care Provider AEB meds and chart reviewed  Patient/Guardian was advised Release of Information must be obtained prior to any record release in order to collaborate their care with an outside provider. Patient/Guardian was advised if they have not already done so to contact the registration department to sign all necessary forms in order for [place] to  release information regarding their care.   Consent: Patient/Guardian gives verbal consent for treatment and assignment of benefits for services provided during this visit. Patient/Guardian expressed understanding and agreed to proceed.   Thresa Ross, MD 9/26/20248:37 AM

## 2022-11-22 ENCOUNTER — Other Ambulatory Visit (HOSPITAL_COMMUNITY): Payer: Self-pay | Admitting: Psychiatry

## 2022-12-04 ENCOUNTER — Encounter (HOSPITAL_COMMUNITY): Payer: Self-pay | Admitting: Emergency Medicine

## 2022-12-04 ENCOUNTER — Emergency Department (HOSPITAL_COMMUNITY)
Admission: EM | Admit: 2022-12-04 | Discharge: 2022-12-04 | Discharge status: Against Medical Advice | Payer: Medicaid Other | Attending: Emergency Medicine | Admitting: Emergency Medicine

## 2022-12-04 ENCOUNTER — Other Ambulatory Visit: Payer: Self-pay

## 2022-12-04 DIAGNOSIS — R1110 Vomiting, unspecified: Secondary | ICD-10-CM | POA: Insufficient documentation

## 2022-12-04 DIAGNOSIS — Z5321 Procedure and treatment not carried out due to patient leaving prior to being seen by health care provider: Secondary | ICD-10-CM | POA: Insufficient documentation

## 2022-12-04 DIAGNOSIS — R109 Unspecified abdominal pain: Secondary | ICD-10-CM | POA: Insufficient documentation

## 2022-12-04 LAB — CBC
HCT: 38.9 % (ref 36.0–46.0)
Hemoglobin: 13.2 g/dL (ref 12.0–15.0)
MCH: 27.2 pg (ref 26.0–34.0)
MCHC: 33.9 g/dL (ref 30.0–36.0)
MCV: 80.2 fL (ref 80.0–100.0)
Platelets: 270 10*3/uL (ref 150–400)
RBC: 4.85 MIL/uL (ref 3.87–5.11)
RDW: 13 % (ref 11.5–15.5)
WBC: 10.1 10*3/uL (ref 4.0–10.5)
nRBC: 0 % (ref 0.0–0.2)

## 2022-12-04 LAB — COMPREHENSIVE METABOLIC PANEL
ALT: 145 U/L — ABNORMAL HIGH (ref 0–44)
AST: 199 U/L — ABNORMAL HIGH (ref 15–41)
Albumin: 4.1 g/dL (ref 3.5–5.0)
Alkaline Phosphatase: 125 U/L (ref 38–126)
Anion gap: 13 (ref 5–15)
BUN: 11 mg/dL (ref 6–20)
CO2: 22 mmol/L (ref 22–32)
Calcium: 7.6 mg/dL — ABNORMAL LOW (ref 8.9–10.3)
Chloride: 100 mmol/L (ref 98–111)
Creatinine, Ser: 0.64 mg/dL (ref 0.44–1.00)
GFR, Estimated: >60 mL/min (ref >60)
Glucose, Bld: 141 mg/dL — ABNORMAL HIGH (ref 70–99)
Potassium: 3.3 mmol/L — ABNORMAL LOW (ref 3.5–5.1)
Sodium: 135 mmol/L (ref 135–145)
Total Bilirubin: 0.5 mg/dL (ref 0.3–1.2)
Total Protein: 7.1 g/dL (ref 6.5–8.1)

## 2022-12-04 LAB — LIPASE, BLOOD: Lipase: 5741 U/L — ABNORMAL HIGH (ref 11–51)

## 2022-12-04 LAB — HCG, SERUM, QUALITATIVE: Preg, Serum: NEGATIVE

## 2022-12-04 NOTE — ED Triage Notes (Signed)
Per [facility] pt coming from home c/o abdominal pain with emesis. Patient had endoscopy done earlier today at [facility].

## 2022-12-04 NOTE — ED Notes (Signed)
Pt family notified staff that pt would like to leave.

## 2022-12-08 ENCOUNTER — Ambulatory Visit (HOSPITAL_COMMUNITY): Payer: Medicaid Other | Admitting: Psychiatry

## 2022-12-29 ENCOUNTER — Ambulatory Visit (HOSPITAL_COMMUNITY): Payer: Medicaid Other | Admitting: Psychiatry

## 2023-01-07 ENCOUNTER — Ambulatory Visit (HOSPITAL_COMMUNITY): Payer: Medicaid Other | Admitting: Psychiatry

## 2023-01-19 ENCOUNTER — Other Ambulatory Visit: Payer: Self-pay | Admitting: Internal Medicine

## 2023-01-19 DIAGNOSIS — E890 Postprocedural hypothyroidism: Secondary | ICD-10-CM

## 2023-01-25 ENCOUNTER — Other Ambulatory Visit (HOSPITAL_COMMUNITY): Payer: Self-pay | Admitting: Psychiatry

## 2023-02-03 NOTE — L&D Delivery Note (Signed)
 OB/GYN Faculty Practice Delivery Note  Cassandra [name] is a 23 y.o. G2P0010 who was admitted for uncontrolled type 1 diabetes and ultimately proceeded with induction for the same reason.  ROM: 25h 33m with clear fluid GBS Status:  negative  Labor Progress: Initial SVE: closed/50/-3.  She was induced with misoprostol, cervical ripening balloon and pitocin and progressed to complete dilation  I counseled the patient on the pros/cons of operative vaginal delivery and recommended  Vacuum assisted delivery due to fetal heart rate and maternal exhaustion. I reviewed the risks of operative vaginal delivery including: fetal risks including cephalohematoma, scalp laceration, subgaleal hemorrhage and maternal risks of worse perineal laceration. We also reviewed risks of shoulder dystocia, need for emergency cesarean section in the event that vacuum delivery is unsuccessful.  Patient gave verbal consent..  We reviewed the alternative to OVD would be cesarean delivery. She consented to them for the indication of  fetal heart rate and maternal exhaustion.  Delivery Date/Time: 11/18/2023 at 0254  The patient was examined. The head was noted to be at +3 station in OA position.  Caput present. Epidural was sufficient for anesthesia.  Bladder emptied. Neonatology present for delivery. The kiwi vacuum cup was positioned over the sagittal suture approximately 3 cm anterior to posterior fontanelle.  Pressure was then increased to green', and the patient was instructed to push.  Pulling was administered along the pelvic curve while patient was pushing; there were 2 contractions and 0 popoffs. The infant was then delivered atraumatically.  Head delivered ROA. Nuchal cord: absent. Shoulders delivered with ease with gentle downward traction.  Cord was clamped and cut immediately due to request of NICU. Infant was passed off to neonatology team.  Pitocin started per protocol. Cord blood drawn and cord gasses  collected. Placenta delivered spontaneously with gentle cord traction. Placenta inspected and appeared intact/normal. Fundus firm with massage and Pitocin. Labia, perineum, vagina, and cervix inspected. A vaginal laceration was repaired with 2-0 vicryl.  Baby Weight: 5 lbs 14 oz  Placenta: L&D Complications: None Lacerations: vaginal EBL: 232 mL Analgesia: Epidural  Count correct: Yes  Infant:  APGAR (1 MIN):  2 APGAR (5 MINS):  6 APGAR (10 MINS):  8    Latest Reference Range & Units 11/18/23 03:00  pH cord blood (arterial) 7.21 - 7.38  7.19 (LL)  pCO2 cord blood (arterial) 42 - 56 mmHg 48  Acid-base deficit 0.0 - 2.0 mmol/L 0.0 - 2.0 mmol/L 9.5 (H) 9.9 (H)  Bicarbonate 13.0 - 22.0 mmol/L 13.0 - 22.0 mmol/L 18.0 18.3  Ph Cord Blood (Venous) 7.24 - 7.38  7.22 (L)  pCO2 Cord Blood (Venous) 42 - 56 mm[Hg] 44   Rollo ONEIDA Bring, MD, FACOG Obstetrician & Gynecologist, [facility] for [employer], [facility]

## 2023-02-19 ENCOUNTER — Other Ambulatory Visit (HOSPITAL_COMMUNITY): Payer: Self-pay | Admitting: Psychiatry

## 2023-03-16 HISTORY — PX: SPHINCTEROTOMY: SHX5279

## 2023-03-19 ENCOUNTER — Other Ambulatory Visit: Payer: Self-pay | Admitting: Internal Medicine

## 2023-03-19 DIAGNOSIS — E1040 Type 1 diabetes mellitus with diabetic neuropathy, unspecified: Secondary | ICD-10-CM

## 2023-04-15 ENCOUNTER — Encounter: Payer: Self-pay | Admitting: Obstetrics and Gynecology

## 2023-04-20 ENCOUNTER — Encounter: Payer: Self-pay | Admitting: *Deleted

## 2023-04-20 NOTE — Progress Notes (Signed)
 Pt called stating that she has a NOB  appt 05/27/23.  She is currently on Elavil 25 mg and Linzess.  I have spoken with J Rasch,NP and she recommend that pt not take the Linzess during pregnancy and she should contact her prescribing DR about changing the Elavil to something more compatible during pregnancy.

## 2023-04-21 ENCOUNTER — Encounter: Payer: Self-pay | Admitting: Obstetrics and Gynecology

## 2023-04-21 ENCOUNTER — Other Ambulatory Visit: Payer: Self-pay | Admitting: *Deleted

## 2023-04-21 ENCOUNTER — Encounter: Payer: Self-pay | Admitting: *Deleted

## 2023-04-21 MED ORDER — PROMETHAZINE HCL 25 MG PO TABS: 25.0000 mg | ORAL_TABLET | Freq: Four times a day (QID) | ORAL | 1 refills | Status: DC | PRN

## 2023-04-21 NOTE — Progress Notes (Signed)
 Pt is early pregnant and is c/oing of nausea with the pregnancy.  She is requesting something to be called into her pharmacy.  Per protocol Phenergan 25 mg sent to her pharmacy.

## 2023-04-26 ENCOUNTER — Telehealth: Admitting: Obstetrics & Gynecology

## 2023-04-26 ENCOUNTER — Encounter: Payer: Self-pay | Admitting: Obstetrics & Gynecology

## 2023-04-26 DIAGNOSIS — O3680X0 Pregnancy with inconclusive fetal viability, not applicable or unspecified: Secondary | ICD-10-CM | POA: Diagnosis not present

## 2023-04-26 DIAGNOSIS — F418 Other specified anxiety disorders: Secondary | ICD-10-CM | POA: Diagnosis not present

## 2023-04-26 DIAGNOSIS — N809 Endometriosis, unspecified: Secondary | ICD-10-CM

## 2023-04-26 DIAGNOSIS — N912 Amenorrhea, unspecified: Secondary | ICD-10-CM

## 2023-04-26 DIAGNOSIS — E1065 Type 1 diabetes mellitus with hyperglycemia: Secondary | ICD-10-CM

## 2023-04-26 DIAGNOSIS — F411 Generalized anxiety disorder: Secondary | ICD-10-CM

## 2023-04-26 DIAGNOSIS — E890 Postprocedural hypothyroidism: Secondary | ICD-10-CM

## 2023-04-26 NOTE — Progress Notes (Signed)
 OBSTETRICS PRENATAL VIRTUAL VISIT ENCOUNTER NOTE  Provider location: Center for [facility] [place]   Patient location: Home  I connected with Cassandra [name] on 04/26/23 at  3:10 PM EDT by MyChart Video Encounter and verified that I am speaking with the correct person using two identifiers. I discussed the limitations, risks, security and privacy concerns of performing an evaluation and management service virtually and the availability of in person appointments. I also discussed with the patient that there may be a patient responsible charge related to this service. The patient expressed understanding and agreed to proceed. Subjective:  Cassandra [name] is a 23 y.o. G1P0000 at Unknown being seen today for ongoing prenatal care.  She is currently monitored for the following issues for this high-risk pregnancy and has Hypertension; Type 1 diabetes, uncontrolled, with neuropathy; Generalized anxiety disorder; Insomnia; Major depressive disorder; MRSA cellulitis; GERD (gastroesophageal reflux disease); Hashimoto's disease; Dyslipidemia; Migraine syndrome; Open angle with borderline findings and low glaucoma risk in both eyes; Symptomatic mammary hypertrophy; Post-surgical hypothyroidism; S/P thyroidectomy; Post-surgical hypoparathyroidism; Dysrhythmia; Endometriosis; and Type 1 diabetes mellitus with hyperglycemia (HCC) on their problem list.  Patient reports  elevated blood sugars.  Patient has type 1 diabetes since age 36.  She had a good pediatric endocrinologist and has not been happy with her adult endocrinologist.  She saw a [place] endocrinologist and self-selected to transfer to [facility].  Her first appointment is Thursday with the new [facility] doctor.  She recently had surgery on her sphincter of Oddi.  She had 3 surgeries.  The first 2 they did pregnancy test.  On February 11 she had her third surgery.  Because she was having her "period" they said she declined her  pregnancy test.  She was spotting on February 10 and 11 and had no further bleeding afterwards.  Most likely her last menstrual period was January 12.  She is in need of a dating ultrasound which is ordered today. .  Patient's fasting blood sugars are 1 20-1 16.  Her lunch runs from 1 50-2 50.  Dinner is also near the same.  Her basal rate is a 29.38 units.  She can have lows down to 40s.  She drops very quickly when she gets below 100.  Fortunately her appointment with her new endocrinologist is Thursday and they will be managing her pump going forward.  I am unable to get her in with our diabetes education prior to that and it will be better for her to have all of her diabetic care under 1 physician office and diabetes office.  She asked the [facility] doctor if she could be seen sooner and they declined giving her an earlier appointment.  The following portions of the patient's history were reviewed and updated as appropriate: allergies, current medications, past family history, past medical history, past social history, past surgical history and problem list.   Objective:  There were no vitals filed for this visit.  Fetal Status:           General:  Alert, oriented and cooperative. Patient is in no acute distress.  Respiratory: Normal respiratory effort, no problems with respiration noted  Mental Status: Normal mood and affect. Normal behavior. Normal judgment and thought content.  Rest of physical exam deferred due to type of encounter  Imaging: No results found.  Assessment and Plan:  Pregnancy: G1P0000 at Unknown 1. Pregnancy, location unknown (Primary)/Gestational age - US OB LESS THAN 14 WEEKS WITH OB TRANSVAGINAL; Future  2.  Type 1 DM--wears pump and in the middle of changing from [facility] to [facility] endocrinologist.  Appt with Smitty Cords is in 3 days.  3.  Anxiety/Depression--Psychiatrist changed her meds from celexa and Wellbutrin to Zoloft;   Doing fine right now and she will report  worsening symptoms if they occur.   4.  Needs new OB appt after viability [place]   Preterm labor symptoms and general obstetric precautions including but not limited to vaginal bleeding, contractions, leaking of fluid and fetal movement were reviewed in detail with the patient. I discussed the assessment and treatment plan with the patient. The patient was provided an opportunity to ask questions and all were answered. The patient agreed with the plan and demonstrated an understanding of the instructions. The patient was advised to call back or seek an in-person office evaluation/go to [facility] at [facility] for any urgent or concerning symptoms. Please refer to After Visit Summary for other counseling recommendations.   I provided 35 minutes of time during this visit including review of records, the video visit, documentation, and coordination of care.  No follow-ups on file.  Future Appointments  Date Time Provider Department Center  04/27/2023  2:00 PM [facility] [place] 1 [facility]  05/27/2023  3:30 PM Milas Hock, MD [facility]    Elsie Lincoln, MD Center for [facility], [facility]

## 2023-04-27 ENCOUNTER — Ambulatory Visit (INDEPENDENT_AMBULATORY_CARE_PROVIDER_SITE_OTHER)

## 2023-04-27 DIAGNOSIS — Z3A01 Less than 8 weeks gestation of pregnancy: Secondary | ICD-10-CM | POA: Diagnosis not present

## 2023-04-27 DIAGNOSIS — O3680X0 Pregnancy with inconclusive fetal viability, not applicable or unspecified: Secondary | ICD-10-CM

## 2023-04-28 ENCOUNTER — Encounter: Payer: Self-pay | Admitting: Obstetrics & Gynecology

## 2023-04-30 ENCOUNTER — Encounter: Payer: Self-pay | Admitting: Obstetrics & Gynecology

## 2023-05-04 ENCOUNTER — Encounter: Payer: Self-pay | Admitting: Obstetrics and Gynecology

## 2023-05-06 ENCOUNTER — Encounter: Payer: Self-pay | Admitting: Obstetrics and Gynecology

## 2023-05-06 ENCOUNTER — Telehealth (INDEPENDENT_AMBULATORY_CARE_PROVIDER_SITE_OTHER): Admitting: Obstetrics and Gynecology

## 2023-05-06 DIAGNOSIS — O219 Vomiting of pregnancy, unspecified: Secondary | ICD-10-CM

## 2023-05-06 DIAGNOSIS — Z3A01 Less than 8 weeks gestation of pregnancy: Secondary | ICD-10-CM

## 2023-05-06 DIAGNOSIS — E109 Type 1 diabetes mellitus without complications: Secondary | ICD-10-CM

## 2023-05-06 MED ORDER — ONDANSETRON 4 MG PO TBDP: 4.0000 mg | ORAL_TABLET | Freq: Four times a day (QID) | ORAL | 3 refills | Status: DC | PRN

## 2023-05-06 MED ORDER — SCOPOLAMINE 1 MG/3DAYS TD PT72: 1.0000 | MEDICATED_PATCH | TRANSDERMAL | 3 refills | Status: DC

## 2023-05-06 MED ORDER — GLYCOPYRROLATE 2 MG PO TABS: 2.0000 mg | ORAL_TABLET | Freq: Three times a day (TID) | ORAL | 3 refills | Status: DC | PRN

## 2023-05-06 NOTE — Progress Notes (Signed)
 OBSTETRICS PRENATAL VIRTUAL VISIT ENCOUNTER NOTE  Provider location: Center for [facility] at [place]   Patient location: Home  I connected with Cassandra [name] on 05/06/23 at  1:50 PM EDT by MyChart Video Encounter and verified that I am speaking with the correct person using two identifiers. I discussed the limitations, risks, security and privacy concerns of performing an evaluation and management service virtually and the availability of in person appointments. I also discussed with the patient that there may be a patient responsible charge related to this service. The patient expressed understanding and agreed to proceed. Subjective:  Cassandra [name] is a 23 y.o. G1P0000 at [redacted]w[redacted]d being seen today for ongoing prenatal care.  She is currently monitored for the following issues for this high-risk pregnancy and has Hypertension; Type 1 diabetes, uncontrolled, with neuropathy; Generalized anxiety disorder; Insomnia; Major depressive disorder; MRSA cellulitis; GERD (gastroesophageal reflux disease); Hashimoto's disease; Dyslipidemia; Migraine syndrome; Open angle with borderline findings and low glaucoma risk in both eyes; Symptomatic mammary hypertrophy; Post-surgical hypothyroidism; S/P thyroidectomy; Post-surgical hypoparathyroidism; Dysrhythmia; Endometriosis; and Type 1 diabetes mellitus with hyperglycemia (HCC) on their problem list.  Patient reports  nausea and trouble with her CBGs. Recently saw new endocrinologist who has made adjustments within the last week.  .   The following portions of the patient's history were reviewed and updated as appropriate: allergies, current medications, past family history, past medical history, past social history, past surgical history and problem list.   Objective:  There were no vitals filed for this visit.  Fetal Status:           General:  Alert, oriented and cooperative. Patient is in no acute distress.  Respiratory:  Normal respiratory effort, no problems with respiration noted  Mental Status: Normal mood and affect. Normal behavior. Normal judgment and thought content.  Rest of physical exam deferred due to type of encounter  Imaging: US OB LESS THAN 14 WEEKS WITH OB TRANSVAGINAL Result Date: 04/27/2023 CLINICAL DATA:  Viability EXAM: OBSTETRIC <14 WK [place] AND TRANSVAGINAL OB US TECHNIQUE: Both transabdominal and transvaginal ultrasound examinations were performed for complete evaluation of the gestation as well as the maternal uterus, adnexal regions, and pelvic cul-de-sac. Transvaginal technique was performed to assess early pregnancy. COMPARISON:  None Available. FINDINGS: Intrauterine gestational sac: Single Yolk sac:  Visualized. Embryo:  Visualized. Cardiac Activity: Visualized. Heart Rate: 125 bpm CRL:  5.7 mm   6 w   3 d                  [place] EDC: 12/18/2023 Subchorionic hemorrhage:  None visualized. Maternal uterus/adnexae: Left ovarian corpus luteum. Normal right ovary. Trace free fluid in the pelvis. IMPRESSION: Single live intrauterine gestation.  No subchorionic hemorrhage. Electronically Signed   By: Annia Belt M.D.   On: 04/27/2023 16:38    Assessment and Plan:  Pregnancy: G1P0000 at Unknown 1. Vomiting or nausea of pregnancy (Primary) Taking phenergan - helps sometimes. Tries taking 3-4 times a day.  Try Zofran. Discussed option for scopolamine patch as well. Some excessive saliva so will also send in Robinul.  Work note written for reasonable accomodation (working from home). Her N/V is impacting her DM and making it more difficult to control her DM. She is already missing work intermittently to avoid emesis at work. Reasonable to request [facility] due to this and to allow better control of her T1DM and thus safer for her pregnancy.   2. T1DM Meals: 150-350 Fastings: 100-250.  Message  to [employer] to get access to her Dexcom data.  Continue at this time with f/u with endocrine with weekly CBG data to  him. We may take over at some point depending on control.     Preterm labor symptoms and general obstetric precautions including but not limited to vaginal bleeding, contractions, leaking of fluid and fetal movement were reviewed in detail with the patient. I discussed the assessment and treatment plan with the patient. The patient was provided an opportunity to ask questions and all were answered. The patient agreed with the plan and demonstrated an understanding of the instructions. The patient was advised to call back or seek an in-person office evaluation/go to MAU at [facility] for any urgent or concerning symptoms. Please refer to After Visit Summary for other counseling recommendations.   I provided 14 minutes of face-to-face time during this encounter.  No follow-ups on file.  Future Appointments  Date Time Provider Department Center  05/06/2023  1:50 PM Milas Hock, MD [facility]  05/27/2023  3:30 PM Milas Hock, MD [facility]    Milas Hock, MD Center for [facility], [facility]

## 2023-05-07 ENCOUNTER — Other Ambulatory Visit: Payer: Self-pay | Admitting: Obstetrics and Gynecology

## 2023-05-07 MED ORDER — DEXCOM G7 SENSOR MISC: 1.0000 | Freq: Every day | 3 refills | Status: AC

## 2023-05-17 ENCOUNTER — Encounter: Payer: Self-pay | Admitting: Obstetrics and Gynecology

## 2023-05-20 ENCOUNTER — Encounter: Payer: Self-pay | Admitting: Obstetrics and Gynecology

## 2023-05-20 ENCOUNTER — Other Ambulatory Visit: Payer: Self-pay

## 2023-05-20 DIAGNOSIS — Z3401 Encounter for supervision of normal first pregnancy, first trimester: Secondary | ICD-10-CM

## 2023-05-24 ENCOUNTER — Encounter: Payer: Self-pay | Admitting: Obstetrics and Gynecology

## 2023-05-26 ENCOUNTER — Ambulatory Visit (HOSPITAL_BASED_OUTPATIENT_CLINIC_OR_DEPARTMENT_OTHER)
Admission: RE | Admit: 2023-05-26 | Discharge: 2023-05-26 | Disposition: A | Discharge status: Home / Self Care | Source: Ambulatory Visit | Attending: Obstetrics and Gynecology | Admitting: Obstetrics and Gynecology

## 2023-05-26 DIAGNOSIS — Z3A10 10 weeks gestation of pregnancy: Secondary | ICD-10-CM | POA: Diagnosis not present

## 2023-05-26 DIAGNOSIS — Z3401 Encounter for supervision of normal first pregnancy, first trimester: Secondary | ICD-10-CM | POA: Insufficient documentation

## 2023-05-27 ENCOUNTER — Encounter: Admitting: Obstetrics and Gynecology

## 2023-05-31 ENCOUNTER — Encounter: Payer: Self-pay | Admitting: Obstetrics and Gynecology

## 2023-06-08 ENCOUNTER — Encounter: Payer: Self-pay | Admitting: Obstetrics and Gynecology

## 2023-06-10 ENCOUNTER — Other Ambulatory Visit: Payer: Self-pay

## 2023-06-10 DIAGNOSIS — Z3A12 12 weeks gestation of pregnancy: Secondary | ICD-10-CM

## 2023-06-10 DIAGNOSIS — Z1379 Encounter for other screening for genetic and chromosomal anomalies: Secondary | ICD-10-CM

## 2023-06-10 NOTE — Progress Notes (Signed)
 Patient sent Mychart message stating she received an email from [place] requesting an order requisition. Patient had [employer] labs drawn at a [place] lab site. Order was placed and requisition was sent to [facility].  Deyci Gesell l Gissela Bloch, CMA

## 2023-06-15 ENCOUNTER — Encounter: Payer: Self-pay | Admitting: Obstetrics and Gynecology

## 2023-06-16 ENCOUNTER — Telehealth: Payer: Self-pay

## 2023-06-16 NOTE — Telephone Encounter (Signed)
 Spoke with patient directly regarding her questions and concerns.  All questions answered and encouraged patient to call with any additional questions or concerns.  Cassandra [name] Lares [employer]

## 2023-06-17 ENCOUNTER — Other Ambulatory Visit (HOSPITAL_COMMUNITY)
Admission: RE | Admit: 2023-06-17 | Discharge: 2023-06-17 | Disposition: A | Discharge status: Home / Self Care | Source: Ambulatory Visit | Attending: Obstetrics and Gynecology | Admitting: Obstetrics and Gynecology

## 2023-06-17 ENCOUNTER — Ambulatory Visit: Admitting: Obstetrics and Gynecology

## 2023-06-17 ENCOUNTER — Encounter: Payer: Self-pay | Admitting: Obstetrics and Gynecology

## 2023-06-17 VITALS — BP 134/84 | HR 105 | Wt 184.0 lb

## 2023-06-17 DIAGNOSIS — F322 Major depressive disorder, single episode, severe without psychotic features: Secondary | ICD-10-CM

## 2023-06-17 DIAGNOSIS — O99341 Other mental disorders complicating pregnancy, first trimester: Secondary | ICD-10-CM | POA: Diagnosis not present

## 2023-06-17 DIAGNOSIS — O99281 Endocrine, nutritional and metabolic diseases complicating pregnancy, first trimester: Secondary | ICD-10-CM | POA: Diagnosis not present

## 2023-06-17 DIAGNOSIS — E1065 Type 1 diabetes mellitus with hyperglycemia: Secondary | ICD-10-CM

## 2023-06-17 DIAGNOSIS — O10011 Pre-existing essential hypertension complicating pregnancy, first trimester: Secondary | ICD-10-CM

## 2023-06-17 DIAGNOSIS — O0990 Supervision of high risk pregnancy, unspecified, unspecified trimester: Secondary | ICD-10-CM | POA: Insufficient documentation

## 2023-06-17 DIAGNOSIS — Z1331 Encounter for screening for depression: Secondary | ICD-10-CM

## 2023-06-17 DIAGNOSIS — O24011 Pre-existing diabetes mellitus, type 1, in pregnancy, first trimester: Secondary | ICD-10-CM

## 2023-06-17 DIAGNOSIS — F411 Generalized anxiety disorder: Secondary | ICD-10-CM

## 2023-06-17 DIAGNOSIS — I10 Essential (primary) hypertension: Secondary | ICD-10-CM

## 2023-06-17 DIAGNOSIS — K219 Gastro-esophageal reflux disease without esophagitis: Secondary | ICD-10-CM

## 2023-06-17 DIAGNOSIS — E890 Postprocedural hypothyroidism: Secondary | ICD-10-CM

## 2023-06-17 DIAGNOSIS — Z3A13 13 weeks gestation of pregnancy: Secondary | ICD-10-CM

## 2023-06-17 MED ORDER — FAMOTIDINE 20 MG PO TABS: 20.0000 mg | ORAL_TABLET | Freq: Two times a day (BID) | ORAL | 3 refills | Status: DC

## 2023-06-17 MED ORDER — ASPIRIN 81 MG PO TBEC: 81.0000 mg | DELAYED_RELEASE_TABLET | Freq: Every day | ORAL | 12 refills | Status: DC

## 2023-06-17 NOTE — Addendum Note (Signed)
 Addended by: Lacey Pian A on: 06/17/2023 05:33 PM   Modules accepted: Orders

## 2023-06-17 NOTE — Progress Notes (Signed)
 History:    Cassandra [name] is a 23 y.o. G2P0010 at [redacted]w[redacted]d by early ultrasound being seen today for her first obstetrical visit.   Patient does intend to breast feed.   Pregnancy history fully reviewed.  Patient reports nausea.      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/25/2019   S/P thyroidectomy 01/27/2019   Symptomatic mammary hypertrophy 03/17/2017   Thyroid  goiter 05/09/2018   Type 1 diabetes, uncontrolled, 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   Past Surgical History:  Procedure Laterality Date   ADENOIDECTOMY     CHOLECYSTECTOMY     COLONOSCOPY     ELPP Surgery     ESOPHAGOGASTRODUODENOSCOPY     SPHINCTEROTOMY  03/16/2023   THYROIDECTOMY N/A 01/25/2019   Procedure: COMPLETE THYROIDECTOMY;  Surgeon: Reynold Caves,  MD;  Location: [facility] OR;  Service: ENT;  Laterality: N/A;   TOE SURGERY Bilateral    for ingrown toenails   TYMPANOSTOMY TUBE PLACEMENT     WISDOM TOOTH EXTRACTION     Family History  Problem Relation Age of Onset   Hypertension Maternal Grandmother    Anxiety disorder Maternal Grandmother    ADD / ADHD Maternal Grandmother    Diabetes Maternal Grandmother    Hyperlipidemia Maternal Grandfather    Hypothyroidism Maternal Grandfather    Cancer Maternal Grandfather        blood cancer (unknown name)   Hyperlipidemia Mother    Anxiety disorder Mother    Hyperlipidemia Other    Migraines Neg Hx    Seizures Neg Hx    Depression Neg Hx    Autism Neg Hx    Bipolar disorder Neg Hx    Schizophrenia Neg Hx    Social History   Tobacco Use   Smoking status: Former    Current packs/day: 0.00    Average packs/day: 0.1 packs/day for 5.0 years (0.7 ttl pk-yrs)    Types: Cigarettes    Start date: 01/22/2014    Quit date: 01/23/2019    Years since quitting: 4.4    Passive exposure: Current (mom  smokes)   Smokeless tobacco: Never  Vaping Use   Vaping status: Never Used  Substance Use Topics   Alcohol use: No   Drug use: No   Allergies  Allergen Reactions   Fish Allergy Diarrhea and Other (See Comments)   Shellfish-Derived Products Diarrhea and Nausea And Vomiting   Tape Itching    Medical tape causes itching   Amoxicillin-Pot Clavulanate Nausea And Vomiting and Other (See Comments)   Lactose Intolerance (Gi)    Shellfish Allergy Nausea And Vomiting and Other (See Comments)   Tilactase Nausea And Vomiting   Latex Rash   Current Outpatient Medications on File Prior to Visit  Medication Sig Dispense Refill   amitriptyline  (ELAVIL ) 75 MG tablet Take 1 tablet (75 mg total) by mouth at bedtime. 90 tablet 1   Blood Glucose Monitoring Suppl (ACCU-CHEK GUIDE) w/Device KIT 1 kit by Does not apply route daily as needed. 2 kit 5   citalopram  (CELEXA ) 10 MG tablet Take 10 mg by mouth daily.      Continuous Blood Gluc Receiver (DEXCOM G6 RECEIVER) DEVI 1 Device by Does not apply route daily as needed. 1 each 0   Continuous Blood Gluc Sensor (DEXCOM G6 SENSOR) MISC CHANGE SENSOR EVERY 10 DAYS AS DIRECTED. 3 each 5   Continuous Blood Gluc Transmit (DEXCOM G6 TRANSMITTER) MISC 1 kit by Does not apply route daily as needed. 1 each 1   Glucagon  (BAQSIMI  TWO PACK) 3 MG/DOSE POWD Place 1 each into the nose as needed (severe hypoglycmia with unresponsiveness). 1 each 3   glycopyrrolate  (ROBINUL ) 2 MG tablet Take 1 tablet (2 mg total) by mouth 3 (three) times daily as needed. 30 tablet 3   insulin  lispro (HUMALOG ) 100 UNIT/ML injection INJECT UP TO 300 UNITS IN INSULIN  PUMP EVERY 48 HOURS, PER DKA AND HYPERGLYCEMIA PROTOCOLS 40 mL 5   levothyroxine  (SYNTHROID ) 150 MCG tablet Take 1 tablet (150 mcg total) by mouth daily. (Patient taking differently: Take 200 mcg by mouth daily.) 90 tablet 3   Melatonin 1 MG CAPS Take by mouth.     ondansetron  (ZOFRAN -ODT) 4 MG disintegrating tablet Take 1 tablet (4 mg total) by mouth every 6 (six) hours as needed for nausea. 20 tablet 3   Ostomy Supplies (ADHESIVE REMOVER WIPES) MISC Use as directed for pump and CGM sites. 50 each 3   Prenatal Vit-Fe Fumarate-FA (MULTIVITAMIN-PRENATAL) 27-0.8 MG TABS tablet Take 1 tablet by mouth daily at 12 noon.     promethazine  (PHENERGAN ) 25 MG tablet Take 1 tablet (25 mg total) by mouth every 6 (six) hours as needed for nausea or vomiting. 30 tablet 1   scopolamine  (TRANSDERM-SCOP) 1 MG/3DAYS Place 1 patch (1.5 mg total) onto the skin every 3 (three) days. 10 patch 3   insulin  degludec (TRESIBA ) 200 UNIT/ML FlexTouch Pen Inject into the skin. (Patient not taking: Reported on 06/17/2023)     Insulin  Syringe-Needle U-100 (INSULIN  SYRINGE .3CC/29GX1/2") 29G X 1/2" 0.3 ML MISC 1 each by Does not apply route 6 (six) times daily. (Patient not taking: Reported on 06/17/2023) 200 each 6   metoprolol  tartrate (LOPRESSOR ) 50 MG tablet Take  50 mg by mouth 2 (two) times daily. (Patient not taking: Reported on 06/17/2023)     No current facility-administered medications on file prior to visit.    Review of Systems Pertinent items noted in HPI and remainder of comprehensive ROS otherwise negative.  Physical Exam:   Vitals:   06/17/23 1523  BP: 134/84  Pulse: (!) 105  Weight: 83.5 kg   Fetal Heart Rate (bpm): 159  General: well-developed, well-nourished female in no acute distress  Skin: normal coloration and turgor, no rashes  Neurologic: oriented, normal, negative, normal mood  Extremities: normal strength, tone, and muscle mass, ROM of all joints is normal  HEENT PERRLA, extraocular movement intact and sclera clear, anicteric  Neck supple and no masses  Cardiovascular: regular rate and rhythm  Respiratory:  no respiratory distress, normal breath sounds    Assessment:     Pregnancy: G2P0010 Patient Active Problem List   Diagnosis Date Noted   Supervision of high risk pregnancy, antepartum 06/17/2023   Type 1 diabetes mellitus with hyperglycemia (HCC) 12/12/2021   Endometriosis 09/20/2020   Dysrhythmia 07/23/2020   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   S/P thyroidectomy 01/27/2019   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/25/2019   Hashimoto's disease 01/2019   Major depressive disorder    MRSA cellulitis    GERD (gastroesophageal reflux disease)    Migraine syndrome 03/17/2017   Symptomatic mammary hypertrophy 03/17/2017   Insomnia 02/17/2017   Generalized anxiety disorder 12/14/2016   Type 1 diabetes, uncontrolled, 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   Hypertension 11/15/2016   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   Dyslipidemia 04/08/2014     Plan:    1. Supervision of high risk pregnancy, antepartum (Primary) -Anatomy ultrasound at 19 weeks. Discussed frequent growth scans -recommended starting ASA 81mg . Pt agreeable   2. Type 1 diabetes mellitus with  hyperglycemia (HCC) -Sees endocrinologist monthly and communicates every two weeks regarding insulin  adjustments. Will recheck A1C today.  -has Dexcom  3. Primary hypertension -not currently on meds. Babyscripts App to monitor at home & BP cuff given. - Check baseline labs  4. Generalized anxiety disorder -taking amitriptyline  & celexa . Has a psychiatrist that she sees, recently referred to a therapist but waiting for an appt  5. Current severe episode of major depressive disorder without psychotic features, unspecified whether recurrent (HCC) -taking amitriptyline  & celexa . Has a psychiatrist that she sees, recently referred to a therapist but waiting for an appt  6. Post-surgical hypothyroidism -Sees endocrinologist monthly and has labs every two weeks. TSH much improved, free T4 normal -Currently on levothyroxine  150mcg  7. Gastroesophageal reflux disease without esophagitis Not currently taking anything but having some nausea, will send pepcid but does have protonix  at home - famotidine (PEPCID) 20 MG tablet; Take 1 tablet (20 mg total) by mouth 2 (two) times daily.  Dispense: 60 tablet; Refill: 3   Initial labs ordered. Continue prenatal vitamins. Problem list reviewed and updated. Genetic Screening discussed, NIPS: LR NIPS, still waiting for Horizon results. Ultrasound discussed; fetal anatomic survey: ordered. Anticipatory guidance about prenatal visits given including labs, ultrasounds, and testing. Discussed usage of Babyscripts and virtual visits  The nature of [place] [facility] for [facility]Faculty Practice with multiple MDs and Advanced Practice Providers was explained to patient; also emphasized that residents, students are part of our team. Routine obstetric precautions reviewed. Encouraged to seek out care at office or emergency room [facility] MAU preferred) for urgent and/or emergent concerns.  Return in about 4 weeks (around 07/15/2023) for OB VISIT, MD or  APP.    Doria Garden, SWHNP 06/17/23 Center for [employer], [facility]

## 2023-06-18 LAB — CBC/D/PLT+RPR+RH+ABO+RUBIGG...
Antibody Screen: NEGATIVE
Basophils Absolute: 0.1 10*3/uL (ref 0.0–0.2)
Basos: 1 %
EOS (ABSOLUTE): 0.1 10*3/uL (ref 0.0–0.4)
Eos: 1 %
HCV Ab: NONREACTIVE
HIV Screen 4th Generation wRfx: NONREACTIVE
Hematocrit: 39.2 % (ref 34.0–46.6)
Hemoglobin: 12.8 g/dL (ref 11.1–15.9)
Hepatitis B Surface Ag: NEGATIVE
Immature Grans (Abs): 0.1 10*3/uL (ref 0.0–0.1)
Immature Granulocytes: 1 %
Lymphocytes Absolute: 2.6 10*3/uL (ref 0.7–3.1)
Lymphs: 19 %
MCH: 27.4 pg (ref 26.6–33.0)
MCHC: 32.7 g/dL (ref 31.5–35.7)
MCV: 84 fL (ref 79–97)
Monocytes Absolute: 0.7 10*3/uL (ref 0.1–0.9)
Monocytes: 5 %
Neutrophils Absolute: 10.1 10*3/uL — ABNORMAL HIGH (ref 1.4–7.0)
Neutrophils: 73 %
Platelets: 324 10*3/uL (ref 150–450)
RBC: 4.67 x10E6/uL (ref 3.77–5.28)
RDW: 14.3 % (ref 11.7–15.4)
RPR Ser Ql: NONREACTIVE
Rh Factor: NEGATIVE
Rubella Antibodies, IGG: 3.76 {index} (ref >0.99)
WBC: 13.6 10*3/uL — ABNORMAL HIGH (ref 3.4–10.8)

## 2023-06-18 LAB — COMPREHENSIVE METABOLIC PANEL WITH GFR
ALT: 13 IU/L (ref 0–32)
AST: 16 IU/L (ref 0–40)
Albumin: 4 g/dL (ref 4.0–5.0)
Alkaline Phosphatase: 80 IU/L (ref 44–121)
BUN/Creatinine Ratio: 12 (ref 9–23)
BUN: 9 mg/dL (ref 6–20)
Bilirubin Total: <0.2 mg/dL (ref 0.0–1.2)
CO2: 21 mmol/L (ref 20–29)
Calcium: 8.6 mg/dL — ABNORMAL LOW (ref 8.7–10.2)
Chloride: 102 mmol/L (ref 96–106)
Creatinine, Ser: 0.73 mg/dL (ref 0.57–1.00)
Globulin, Total: 3 g/dL (ref 1.5–4.5)
Glucose: 107 mg/dL — ABNORMAL HIGH (ref 70–99)
Potassium: 4 mmol/L (ref 3.5–5.2)
Sodium: 139 mmol/L (ref 134–144)
Total Protein: 7 g/dL (ref 6.0–8.5)
eGFR: 118 mL/min/{1.73_m2} (ref >59)

## 2023-06-18 LAB — PROTEIN / CREATININE RATIO, URINE
Creatinine, Urine: 47 mg/dL
Protein, Ur: <4 mg/dL

## 2023-06-18 LAB — HEMOGLOBIN A1C
Est. average glucose Bld gHb Est-mCnc: 166 mg/dL
Hgb A1c MFr Bld: 7.4 % — ABNORMAL HIGH (ref 4.8–5.6)

## 2023-06-18 LAB — HCV INTERPRETATION

## 2023-06-19 ENCOUNTER — Ambulatory Visit: Payer: Self-pay | Admitting: Obstetrics and Gynecology

## 2023-06-19 ENCOUNTER — Encounter: Payer: Self-pay | Admitting: Obstetrics and Gynecology

## 2023-06-19 DIAGNOSIS — O26899 Other specified pregnancy related conditions, unspecified trimester: Secondary | ICD-10-CM | POA: Insufficient documentation

## 2023-06-19 LAB — CULTURE, OB URINE

## 2023-06-19 LAB — URINE CULTURE, OB REFLEX: Organism ID, Bacteria: NO GROWTH

## 2023-06-21 LAB — CERVICOVAGINAL ANCILLARY ONLY
Chlamydia: NEGATIVE
Comment: NEGATIVE
Comment: NORMAL
Neisseria Gonorrhea: NEGATIVE

## 2023-06-22 LAB — HORIZON CUSTOM

## 2023-06-22 LAB — PANORAMA PRENATAL TEST FULL PANEL:PANORAMA TEST PLUS 5 ADDITIONAL MICRODELETIONS

## 2023-06-23 ENCOUNTER — Ambulatory Visit: Payer: Self-pay | Admitting: Obstetrics and Gynecology

## 2023-06-25 ENCOUNTER — Ambulatory Visit (INDEPENDENT_AMBULATORY_CARE_PROVIDER_SITE_OTHER)

## 2023-06-25 VITALS — BP 132/84 | HR 98 | Ht 61.0 in | Wt 182.0 lb

## 2023-06-25 DIAGNOSIS — R300 Dysuria: Secondary | ICD-10-CM | POA: Diagnosis not present

## 2023-06-25 LAB — POCT URINALYSIS DIPSTICK
Bilirubin, UA: NEGATIVE
Blood, UA: NEGATIVE
Glucose, UA: NEGATIVE
Ketones, UA: NEGATIVE
Leukocytes, UA: NEGATIVE
Nitrite, UA: NEGATIVE
Protein, UA: POSITIVE — AB
Spec Grav, UA: 1.01 (ref 1.010–1.025)
Urobilinogen, UA: 0.2 U/dL
pH, UA: 7.5 (ref 5.0–8.0)

## 2023-06-25 NOTE — Progress Notes (Signed)
 SUBJECTIVE: Cassandra [name] is a 23 y.o. female who complains of urinary frequency, metallic odor,  urgency and dysuria x 4 days, without flank pain, fever, chills, or abnormal vaginal discharge or bleeding.   OBJECTIVE: Appears well, in no apparent distress.  Vital signs are normal. Urine dipstick shows negative for all components, trace protein.    ASSESSMENT: Dysuria  PLAN: Treatment per orders.  Call or return to clinic prn if these symptoms worsen or fail to improve as anticipated. Reviewed with Raford Bunk, CNM, urine culture sent, results pending.  Evelia Hipp, RN

## 2023-06-27 ENCOUNTER — Ambulatory Visit: Payer: Self-pay | Admitting: Certified Nurse Midwife

## 2023-06-27 LAB — CULTURE, OB URINE

## 2023-06-27 LAB — URINE CULTURE, OB REFLEX

## 2023-07-05 ENCOUNTER — Encounter: Payer: Self-pay | Admitting: Obstetrics and Gynecology

## 2023-07-07 ENCOUNTER — Ambulatory Visit: Payer: Self-pay | Admitting: Obstetrics and Gynecology

## 2023-07-16 ENCOUNTER — Ambulatory Visit (INDEPENDENT_AMBULATORY_CARE_PROVIDER_SITE_OTHER): Admitting: Obstetrics and Gynecology

## 2023-07-16 VITALS — BP 114/77 | HR 92 | Wt 184.0 lb

## 2023-07-16 DIAGNOSIS — E1065 Type 1 diabetes mellitus with hyperglycemia: Secondary | ICD-10-CM

## 2023-07-16 DIAGNOSIS — E892 Postprocedural hypoparathyroidism: Secondary | ICD-10-CM

## 2023-07-16 DIAGNOSIS — E063 Autoimmune thyroiditis: Secondary | ICD-10-CM

## 2023-07-16 DIAGNOSIS — E890 Postprocedural hypothyroidism: Secondary | ICD-10-CM

## 2023-07-16 DIAGNOSIS — O26899 Other specified pregnancy related conditions, unspecified trimester: Secondary | ICD-10-CM

## 2023-07-16 DIAGNOSIS — Z3A18 18 weeks gestation of pregnancy: Secondary | ICD-10-CM | POA: Diagnosis not present

## 2023-07-16 DIAGNOSIS — Z6791 Unspecified blood type, Rh negative: Secondary | ICD-10-CM

## 2023-07-16 DIAGNOSIS — I10 Essential (primary) hypertension: Secondary | ICD-10-CM | POA: Diagnosis not present

## 2023-07-16 NOTE — Progress Notes (Signed)
 PRENATAL VISIT NOTE  Subjective:  Cassandra [name] is a 23 y.o. G2P0010 at [redacted]w[redacted]d being seen today for ongoing prenatal care.  She is currently monitored for the following issues for this high-risk pregnancy and has Hypertension; Type 1 diabetes, uncontrolled, with neuropathy; Generalized anxiety disorder; Insomnia; Major depressive disorder; MRSA cellulitis; GERD (gastroesophageal reflux disease); Hashimoto's disease; Dyslipidemia; Migraine syndrome; Open angle with borderline findings and low glaucoma risk in both eyes; Symptomatic mammary hypertrophy; Post-surgical hypothyroidism; S/P thyroidectomy; Post-surgical hypoparathyroidism; Dysrhythmia; Endometriosis; Type 1 diabetes mellitus with hyperglycemia (HCC); Supervision of high risk pregnancy, antepartum; Rh negative state in antepartum period; and [redacted] weeks gestation of pregnancy on their problem list.  Patient reports no complaints.  Contractions: Not present. Vag. Bleeding: None.  Movement: Absent. Denies leaking of fluid.   The following portions of the patient's history were reviewed and updated as appropriate: allergies, current medications, past family history, past medical history, past social history, past surgical history and problem list.   Objective:    Vitals:   07/16/23 1008  BP: 114/77  Pulse: 92  Weight: 184 lb (83.5 kg)    Fetal Status:      Movement: Absent    General: Alert, oriented and cooperative. Patient is in no acute distress.  Skin: Skin is warm and dry. No rash noted.   Cardiovascular: Normal heart rate noted  Respiratory: Normal respiratory effort, no problems with respiration noted  Abdomen: Soft, gravid, appropriate for gestational age.  Pain/Pressure: Present     Pelvic: Cervical exam deferred        Extremities: Normal range of motion.  Edema: Trace  Mental Status: Normal mood and affect. Normal behavior. Normal judgment and thought content.   Assessment and Plan:  Pregnancy: G2P0010 at  [redacted]w[redacted]d  1. Type 1 diabetes mellitus with hyperglycemia (HCC)  Recommend Eye Exam.  Continue ASA daily Last HgbA1c: 7.4%  Seeing Endocrinology regularly. Ok with OB management of DM going forward Recommend weekly visits until BS are more stable.   Dexom in place: target BS set to 65-140: 35% in target range today. Significant meal peaks with all meals Overall baseline High She is very worried about low BS and requests very small changes to pump settings.   Tslim insulin  pump settings: Currently on Automated mode (IQ mode) and likely will need to switch to manual mode as pregnancy progresses.   Basal rates:  12 am-1.25 units per hour 4 am-0.99 units per hour 6 am-1.15 units per hour 8 am-1.2 units per hour 12 pm-1.2 units per hour 3 pm- 1.25 units per hour 9 pm- 1.4 units per hour   Sensitivity/correction factor 25: Will decrease to 24, will decrease slowly by 1 every 4 days.   Continue Carb ratio:1:6  Continue Target BS 110   2. Hashimoto's disease  - TSH - T4, free - T3, free - Calcium  - Anti-TPO Ab (RDL)  3. Post-surgical hypothyroidism  - TSH - T4, free - T3, free - Calcium  - Anti-TPO Ab (RDL)  - Continue Synthroid  200 mcg daily   4. Post-surgical hypoparathyroidism (Primary)  Continue Q2 week calcium  levels  Continue Calcitriol  1 mcg BID, increased recently from 0.5 mcg TID.  Close f/u with MFM    5. Rh negative state in antepartum period  Rhogam at 28w    Preterm labor symptoms and general obstetric precautions including but not limited to vaginal bleeding, contractions, leaking of fluid and fetal movement were reviewed in detail with the patient. Please refer to After Visit  Summary for other counseling recommendations.   Return 1 week follow up for diabetes..  Future Appointments  Date Time Provider Department Center  07/28/2023 11:10 AM Izell Marsh, MD [facility]  07/29/2023  2:00 PM [facility] PROVIDER 1 [facility]   07/29/2023  2:30 PM [facility] US4 [facility]  08/03/2023 11:10 AM Sari Cogan, Juliette Oh, NP [facility]  08/11/2023 11:10 AM Lacey Pian, MD [facility]  08/17/2023 11:10 AM Katlynn Naser, Juliette Oh, NP [facility]  08/24/2023 11:10 AM Cathi Hazan, Juliette Oh, NP [facility]  09/01/2023 11:10 AM Izell Marsh, MD [facility]    Almond Jaffe, NP

## 2023-07-19 DIAGNOSIS — O99210 Obesity complicating pregnancy, unspecified trimester: Secondary | ICD-10-CM | POA: Insufficient documentation

## 2023-07-20 ENCOUNTER — Encounter (HOSPITAL_COMMUNITY): Payer: Self-pay | Admitting: Obstetrics and Gynecology

## 2023-07-20 ENCOUNTER — Inpatient Hospital Stay (HOSPITAL_COMMUNITY)
Admission: AD | Admit: 2023-07-20 | Discharge: 2023-07-20 | Disposition: A | Discharge status: Home / Self Care | Attending: Obstetrics and Gynecology | Admitting: Obstetrics and Gynecology

## 2023-07-20 ENCOUNTER — Inpatient Hospital Stay (HOSPITAL_COMMUNITY)

## 2023-07-20 ENCOUNTER — Telehealth: Payer: Self-pay

## 2023-07-20 DIAGNOSIS — N3001 Acute cystitis with hematuria: Secondary | ICD-10-CM | POA: Insufficient documentation

## 2023-07-20 DIAGNOSIS — O2312 Infections of bladder in pregnancy, second trimester: Secondary | ICD-10-CM | POA: Diagnosis not present

## 2023-07-20 DIAGNOSIS — Z3A18 18 weeks gestation of pregnancy: Secondary | ICD-10-CM | POA: Diagnosis not present

## 2023-07-20 DIAGNOSIS — R300 Dysuria: Secondary | ICD-10-CM | POA: Diagnosis present

## 2023-07-20 LAB — CBC WITH DIFFERENTIAL/PLATELET
Abs Immature Granulocytes: 0.08 10*3/uL — ABNORMAL HIGH (ref 0.00–0.07)
Basophils Absolute: 0.1 10*3/uL (ref 0.0–0.1)
Basophils Relative: 1 %
Eosinophils Absolute: 0.1 10*3/uL (ref 0.0–0.5)
Eosinophils Relative: 1 %
HCT: 34.7 % — ABNORMAL LOW (ref 36.0–46.0)
Hemoglobin: 12 g/dL (ref 12.0–15.0)
Immature Granulocytes: 1 %
Lymphocytes Relative: 14 %
Lymphs Abs: 1.8 10*3/uL (ref 0.7–4.0)
MCH: 27.9 pg (ref 26.0–34.0)
MCHC: 34.6 g/dL (ref 30.0–36.0)
MCV: 80.7 fL (ref 80.0–100.0)
Monocytes Absolute: 0.7 10*3/uL (ref 0.1–1.0)
Monocytes Relative: 5 %
Neutro Abs: 10.5 10*3/uL — ABNORMAL HIGH (ref 1.7–7.7)
Neutrophils Relative %: 78 %
Platelets: 254 10*3/uL (ref 150–400)
RBC: 4.3 MIL/uL (ref 3.87–5.11)
RDW: 14.1 % (ref 11.5–15.5)
WBC: 13.3 10*3/uL — ABNORMAL HIGH (ref 4.0–10.5)
nRBC: 0 % (ref 0.0–0.2)

## 2023-07-20 LAB — URINALYSIS, ROUTINE W REFLEX MICROSCOPIC
Bilirubin Urine: NEGATIVE
Glucose, UA: NEGATIVE mg/dL
Ketones, ur: NEGATIVE mg/dL
Nitrite: NEGATIVE
Protein, ur: NEGATIVE mg/dL
RBC / HPF: >50 RBC/hpf (ref 0–5)
Specific Gravity, Urine: 1.011 (ref 1.005–1.030)
WBC, UA: >50 WBC/hpf (ref 0–5)
pH: 8 (ref 5.0–8.0)

## 2023-07-20 LAB — GLUCOSE, CAPILLARY
Glucose-Capillary: 40 mg/dL — CL (ref 70–99)
Glucose-Capillary: 60 mg/dL — ABNORMAL LOW (ref 70–99)
Glucose-Capillary: 171 mg/dL — ABNORMAL HIGH (ref 70–99)
Glucose-Capillary: 75 mg/dL (ref 70–99)
Glucose-Capillary: 258 mg/dL — ABNORMAL HIGH (ref 70–99)
Glucose-Capillary: 269 mg/dL — ABNORMAL HIGH (ref 70–99)

## 2023-07-20 LAB — COMPREHENSIVE METABOLIC PANEL WITH GFR
ALT: 14 U/L (ref 0–44)
AST: 18 U/L (ref 15–41)
Albumin: 3 g/dL — ABNORMAL LOW (ref 3.5–5.0)
Alkaline Phosphatase: 56 U/L (ref 38–126)
Anion gap: 11 (ref 5–15)
BUN: 6 mg/dL (ref 6–20)
CO2: 21 mmol/L — ABNORMAL LOW (ref 22–32)
Calcium: 8.3 mg/dL — ABNORMAL LOW (ref 8.9–10.3)
Chloride: 106 mmol/L (ref 98–111)
Creatinine, Ser: 0.72 mg/dL (ref 0.44–1.00)
GFR, Estimated: >60 mL/min (ref >60)
Glucose, Bld: 79 mg/dL (ref 70–99)
Potassium: 3.5 mmol/L (ref 3.5–5.1)
Sodium: 138 mmol/L (ref 135–145)
Total Bilirubin: 0.3 mg/dL (ref 0.0–1.2)
Total Protein: 6.9 g/dL (ref 6.5–8.1)

## 2023-07-20 LAB — WET PREP, GENITAL
Clue Cells Wet Prep HPF POC: NONE SEEN
Sperm: NONE SEEN
Trich, Wet Prep: NONE SEEN
WBC, Wet Prep HPF POC: <10 (ref <10)
Yeast Wet Prep HPF POC: NONE SEEN

## 2023-07-20 MED ORDER — NITROFURANTOIN MONOHYD MACRO 100 MG PO CAPS: 100.0000 mg | ORAL_CAPSULE | Freq: Two times a day (BID) | ORAL | 0 refills | Status: DC

## 2023-07-20 MED ORDER — GLUCOSE 40 % PO GEL
ORAL | Status: AC
  Filled 2023-07-20: qty 1.21

## 2023-07-20 MED ORDER — GLUCOSE 40 % PO GEL
1.0000 | ORAL | Status: DC
Start: 2023-07-20 — End: 2023-07-21

## 2023-07-20 MED ORDER — TAMSULOSIN HCL 0.4 MG PO CAPS: 0.4000 mg | ORAL_CAPSULE | Freq: Every day | ORAL | 2 refills | Status: DC

## 2023-07-20 NOTE — MAU Note (Signed)
 Cassandra [name] is a 23 y.o. at [redacted]w[redacted]d here in MAU reporting: been feeling like she has to urinate real bad but nothing coming out. Stated she had had some vag bleeding as well. Denies any abd pain just like spasm in her bladder. As she was in the waiting room her dexcom monitor read her CBG was 42. Had pt go int room and rechecked with our CBG machine 40. Gave pt some juice and will reassess in 15 min  LMP:  Onset of complaint: this morning Pain score: 0 Vitals:   07/20/23 1459  BP: 117/74  Pulse: (!) 106  Resp: 18  Temp: 98.4 F (36.9 C)     FHT:   Lab orders placed from triage: cbg

## 2023-07-20 NOTE — MAU Note (Signed)
 Juice given- 1458 & 1513  Blood sugars as follows 1455- CBG 40; provider notified 1512- GBG 60; provider at bedside 1532-CBG 75; provider notified 1627-CBG 171; provider notified; at bedside; pt will put her dexcom back on as she removed it prior to coming here as it got snagged on her jumpsuit, so she removed it

## 2023-07-20 NOTE — MAU Provider Note (Signed)
 Event Date/Time   First Provider Initiated Contact with Patient 07/20/23 1502      S [name] is a 23 y.o. G2P0010 pregnant female at [redacted]w[redacted]d who presents to MAU today with complaint of inability to urinate and VB.  Pt states she feels as though she has to urinate and when she attempts only a significantly smaller portion of urine comes out.  She feels as though she's not emptying her bladder appropriately as she's experiencing what she believes is bladder spasms as well.  Denies F/C, flank pain, dysuria. She states she has noticed some bloody vaginal discharge when she wipes after urination.  She states  denies feeling fetal movement yet, denies LOF.  Pt states coitus 48hrs ago. Of note pt is a T1DM who states her insulin  pump feel off during registration but then her Dexcom alerted and told her her Blood Sugar was 42, pt asymptomatic.  However, on recheck with facility CBG machine showed 40.  Pt given juice and recheck was 60 on facility machine. Insulin  pump remains disconnected. Pt also reports bolus just prior to lunch about an hour prior to presentation here.  Pt reports hot dogs and french fries for a typical lunch today, tolerated entire meal.    Receives care at [facility]. Prenatal records reviewed.  Pertinent items noted in HPI and remainder of comprehensive ROS otherwise negative.   O BP 125/70   Pulse 93   Temp 98.4 F (36.9 C) (Axillary)   Resp 18   LMP 03/15/2023 (Approximate)   SpO2 100%  Physical Exam Vitals and nursing note reviewed.  Constitutional:      General: She is not in acute distress.    Appearance: Normal appearance. She is obese. She is not ill-appearing.  HENT:     Head: Normocephalic and atraumatic.     Right Ear: External ear normal.     Left Ear: External ear normal.     Nose: Nose normal.     Mouth/Throat:     Mouth: Mucous membranes are moist.     Pharynx: Oropharynx is clear.   Eyes:     Extraocular Movements: Extraocular movements  intact.     Conjunctiva/sclera: Conjunctivae normal.    Cardiovascular:     Rate and Rhythm: Normal rate.  Pulmonary:     Effort: Pulmonary effort is normal. No respiratory distress.  Abdominal:     General: There is no distension.     Palpations: Abdomen is soft.     Tenderness: There is no abdominal tenderness.   Musculoskeletal:        General: No swelling. Normal range of motion.     Cervical back: Normal range of motion.   Skin:    General: Skin is warm and dry.   Neurological:     Mental Status: She is alert and oriented to person, place, and time. Mental status is at baseline.     Motor: No weakness.     Gait: Gait normal.   Psychiatric:        Mood and Affect: Mood normal.        Behavior: Behavior normal.    Pt informed that the ultrasound is considered a limited OB ultrasound and is not intended to be a complete ultrasound exam.  Patient also informed that the ultrasound is not being completed with the intent of assessing for fetal or placental anomalies or any pelvic abnormalities.  Explained that the purpose of today's ultrasound is to assess for  viability.  Patient acknowledges the purpose of the exam and the limitations of the study.    My interpretation: infant noted to be moving and FHR of 148bpm, bladder also visualized and not overly distended  MDM: MAU Course: Infant with confirmed cardiac activity and reassuring movements noted.  Pt's hypoglycemia correcting with PO and pt remains asymptomatic and normal cognition with pump disconnected.  Will CTM blood glucose levels closely while working up seemingly urinary retention complaints.  Bladder scanner with postvoid residual of 110 cc within the bladder after void of 20 cc.    UA small LE, rare bacteria, moderate hematuria Sending Urine for OB Ucx CBCdiff 13.3 with left shift, Hgb 12.0, Plts 254 CMP Cr stable, remaining reassuring as well  Wet Prep negative GC collected  Glucose 60>75>171, advised patient  to reestablish her insulin  pump to receive her basal rate. Pt agreeable.   Renal US  = suspected mild right hydronephrosis On my read there also appeared to be less of R ureter jet  Pt counseled on US  results and risk of CT scan, she's agreeable to move forward with CT scan at this time.  Pt declines any analgesic at this time. UA also resulted with small infectious potential however given symptoms will choose to treat as acute cystitis with hematuria.   Pt glucose reported as 258 and pt had already given herself 3U humalog  via pump.  On recheck 269 and pump administered another 1.7U.   Renal Stone CT = negative   Spoke with patient about negative workup and suspicion for possible passed stone.  Encourage pt to follow up with urology if occurs again.  Pt agreeable and understanding of treating UTI and signs / symptoms to follow up on.   AP #[redacted] weeks gestation #Acute cystitis with hematuria - sending Macrobid  Discharge from MAU in stable condition with strict/usual precautions Follow up at [facility] as scheduled for ongoing prenatal care  Allergies as of 07/20/2023       Reactions   Fish Allergy Diarrhea, Other (See Comments)   Shellfish-derived Products Diarrhea, Nausea And Vomiting   Tape Itching   Medical tape causes itching   Amoxicillin-pot Clavulanate Nausea And Vomiting, Other (See Comments)   Lactose Intolerance (gi)    Shellfish Allergy Nausea And Vomiting, Other (See Comments)   Tilactase Nausea And Vomiting   Latex Rash        Medication List     TAKE these medications    Accu-Chek Guide w/Device Kit 1 kit by Does not apply route daily as needed.   Adhesive Remover Wipes Misc Use as directed for pump and CGM sites.   amitriptyline  75 MG tablet Commonly known as: ELAVIL  Take 1 tablet (75 mg total) by mouth at bedtime.   aspirin  EC 81 MG tablet Take 1 tablet (81 mg total) by mouth daily. Swallow whole.   Baqsimi  Two Pack 3 MG/DOSE Powd Generic drug:  Glucagon  Place 1 each into the nose as needed (severe hypoglycmia with unresponsiveness).   calcitRIOL  0.5 MCG capsule Commonly known as: ROCALTROL  Take 0.5 mcg by mouth daily.   calcium  carbonate 1500 (600 Ca) MG Tabs tablet Commonly known as: OSCAL Take 1,500 mg by mouth in the morning, at noon, and at bedtime.   citalopram  10 MG tablet Commonly known as: CELEXA  Take 10 mg by mouth daily.   Dexcom G6 Receiver Devi 1 Device by Does not apply route daily as needed.   Dexcom G6 Sensor Misc CHANGE SENSOR EVERY 10 DAYS AS DIRECTED.  Dexcom G6 Transmitter Misc 1 kit by Does not apply route daily as needed.   famotidine  20 MG tablet Commonly known as: Pepcid  Take 1 tablet (20 mg total) by mouth 2 (two) times daily.   glycopyrrolate  2 MG tablet Commonly known as: ROBINUL  Take 1 tablet (2 mg total) by mouth 3 (three) times daily as needed.   insulin  degludec 200 UNIT/ML FlexTouch Pen Commonly known as: TRESIBA  Inject into the skin.   insulin  lispro 100 UNIT/ML injection Commonly known as: HumaLOG  INJECT UP TO 300 UNITS IN INSULIN  PUMP EVERY 48 HOURS, PER DKA AND HYPERGLYCEMIA PROTOCOLS   INSULIN  SYRINGE .3CC/29GX1/2 29G X 1/2 0.3 ML Misc 1 each by Does not apply route 6 (six) times daily.   levothyroxine  150 MCG tablet Commonly known as: SYNTHROID  Take 1 tablet (150 mcg total) by mouth daily. What changed: how much to take   Melatonin 1 MG Caps Take by mouth.   metoprolol  tartrate 50 MG tablet Commonly known as: LOPRESSOR  Take 50 mg by mouth 2 (two) times daily.   multivitamin-prenatal 27-0.8 MG Tabs tablet Take 1 tablet by mouth daily at 12 noon.   nitrofurantoin (macrocrystal-monohydrate) 100 MG capsule Commonly known as: MACROBID Take 1 capsule (100 mg total) by mouth 2 (two) times daily.   ondansetron  4 MG disintegrating tablet Commonly known as: ZOFRAN -ODT Take 1 tablet (4 mg total) by mouth every 6 (six) hours as needed for nausea.   promethazine  25  MG tablet Commonly known as: PHENERGAN  Take 1 tablet (25 mg total) by mouth every 6 (six) hours as needed for nausea or vomiting.   scopolamine  1 MG/3DAYS Commonly known as: TRANSDERM-SCOP Place 1 patch (1.5 mg total) onto the skin every 3 (three) days.        Ebony Goldstein, MD 07/20/2023 7:56 PM

## 2023-07-20 NOTE — Telephone Encounter (Signed)
 Pt sent mychart messages with concerns with recent bleeding, abdominal discomfort, urinary frequency, feeling like could not empty bladder.  Pt reported has been very uncomfortable today. RN advised for patient to be evaluated. Patient reported about 7 minutes from MAU and verbalized understanding of instructions. RN gave address to MAU/triage. Pt reported en route.  Evelia Hipp, RN

## 2023-07-21 LAB — GC/CHLAMYDIA PROBE AMP (~~LOC~~) NOT AT ARMC
Chlamydia: NEGATIVE
Comment: NEGATIVE
Comment: NORMAL
Neisseria Gonorrhea: NEGATIVE

## 2023-07-21 LAB — CULTURE, OB URINE: Special Requests: NORMAL

## 2023-07-25 LAB — AFP, SERUM, OPEN SPINA BIFIDA
AFP MoM: 0.8
AFP Value: 24.2 ng/mL
Gest. Age on Collection Date: 18 wk
Maternal Age At EDD: 23.6 a
OSBR Risk 1 IN: 7789
Test Results:: NEGATIVE
Weight: 184 [lb_av]

## 2023-07-25 LAB — T3, FREE: T3, Free: 2.7 pg/mL (ref 2.0–4.4)

## 2023-07-25 LAB — T4, FREE: Free T4: 1.75 ng/dL (ref 0.82–1.77)

## 2023-07-25 LAB — TSH: TSH: 0.214 u[IU]/mL — ABNORMAL LOW (ref 0.450–4.500)

## 2023-07-25 LAB — CALCIUM: Calcium: 8.1 mg/dL — ABNORMAL LOW (ref 8.7–10.2)

## 2023-07-25 LAB — ANTI-TPO AB (RDL): Anti-TPO Ab (RDL): 25.3 [IU]/mL — ABNORMAL HIGH (ref <9.0)

## 2023-07-26 ENCOUNTER — Ambulatory Visit: Payer: Self-pay | Admitting: Obstetrics and Gynecology

## 2023-07-26 DIAGNOSIS — O0990 Supervision of high risk pregnancy, unspecified, unspecified trimester: Secondary | ICD-10-CM

## 2023-07-28 ENCOUNTER — Ambulatory Visit: Admitting: Obstetrics and Gynecology

## 2023-07-28 VITALS — BP 126/81 | HR 96 | Wt 188.0 lb

## 2023-07-28 DIAGNOSIS — O0990 Supervision of high risk pregnancy, unspecified, unspecified trimester: Secondary | ICD-10-CM | POA: Diagnosis not present

## 2023-07-28 DIAGNOSIS — O26899 Other specified pregnancy related conditions, unspecified trimester: Secondary | ICD-10-CM

## 2023-07-28 DIAGNOSIS — I10 Essential (primary) hypertension: Secondary | ICD-10-CM

## 2023-07-28 DIAGNOSIS — Z3A19 19 weeks gestation of pregnancy: Secondary | ICD-10-CM | POA: Diagnosis not present

## 2023-07-28 DIAGNOSIS — E890 Postprocedural hypothyroidism: Secondary | ICD-10-CM

## 2023-07-28 DIAGNOSIS — F411 Generalized anxiety disorder: Secondary | ICD-10-CM

## 2023-07-28 DIAGNOSIS — E1065 Type 1 diabetes mellitus with hyperglycemia: Secondary | ICD-10-CM

## 2023-07-28 DIAGNOSIS — E892 Postprocedural hypoparathyroidism: Secondary | ICD-10-CM

## 2023-07-28 DIAGNOSIS — Z6791 Unspecified blood type, Rh negative: Secondary | ICD-10-CM

## 2023-07-28 DIAGNOSIS — F322 Major depressive disorder, single episode, severe without psychotic features: Secondary | ICD-10-CM

## 2023-07-28 NOTE — Progress Notes (Signed)
 PRENATAL VISIT NOTE  Subjective:  Cassandra [name] is a 23 y.o. G2P0010 at [redacted]w[redacted]d being seen today for ongoing prenatal care.  She is currently monitored for the following issues for this high-risk pregnancy and has Hypertension; Type 1 diabetes, uncontrolled, with neuropathy; Generalized anxiety disorder; Insomnia; Major depressive disorder; MRSA cellulitis; Hashimoto's disease; Symptomatic mammary hypertrophy; Post-surgical hypothyroidism; Post-surgical hypoparathyroidism; Endometriosis; Type 1 diabetes mellitus with hyperglycemia (HCC); Supervision of high risk pregnancy, antepartum; Rh negative state in antepartum period; and Obesity affecting pregnancy, antepartum on their problem list.  Patient reports feeling better since starting treatment for UTI.  Contractions: Not present. Vag. Bleeding: None.  Movement: Present. Denies leaking of fluid.   The following portions of the patient's history were reviewed and updated as appropriate: allergies, current medications, past family history, past medical history, past social history, past surgical history and problem list.   Objective:   Vitals:   07/28/23 1103  BP: 126/81  Pulse: 96  Weight: 188 lb (85.3 kg)    Fetal Status: Fetal Heart Rate (bpm): 155 (Simultaneous filing. User may not have seen previous data.)   Movement: Present     General:  Alert, oriented and cooperative. Patient is in no acute distress.  Skin: Skin is warm and dry. No rash noted.   Cardiovascular: Normal heart rate noted  Respiratory: Normal respiratory effort, no problems with respiration noted  Abdomen: Soft, gravid, appropriate for gestational age.  Pain/Pressure: Absent      Assessment and Plan:  Pregnancy: G2P0010 at [redacted]w[redacted]d 1. Supervision of high risk pregnancy, antepartum (Primary) 2. [redacted] weeks gestation of pregnancy AFP negative Anatomy US  scheduled tomorrow (6/26)  3. Type 1 diabetes mellitus with hyperglycemia (HCC) - Dexcom data reviewed -  36% in range with 63% high/very high and <2% low/very low. She is having significant highs in the evenings and 5 of them were rebound highs.  - Reports that she feels hypoglycemic with BG in the 90s. Tried bolusing insulin  20 minutes before meals but was then dropping low. Notes she's had low appetite so she hasn't been eating her full meals and is eating less carbs than she is calculating for her bolus.  - Discussed accounting for how many carbs she is actually eating rather than what's on her plate so she isn't over-bolusing/correcting. Will also increase basal for 3p (1.25 > 1.5) and 9p (1.4 > 1.6) epochs to try to get overnight highs in range  - Serial growth US , antenatal testing per MFM. Discussed that we anticipate delivery by 37 weeks if BG does not improve  4. Rh negative state in antepartum period Rhogam at 28 weeks  5. Post-surgical hypothyroidism TSH 0.244 5/14 Continue synthroid  200mcg daily - we discussed backing off, but pt reports TSH > 10 at the next dose down  6. Post-surgical hypoparathyroidism Calcium  q2wk, managed by endocrinologist. Ca 8.2 on 6/24  7. Primary hypertension Normotensive today, no current antihypertensives ldASA  8. Current severe episode of major depressive disorder without psychotic features, unspecified whether recurrent (HCC) 9. Generalized anxiety disorder No current mod complaints, continue celexa  and amitriptyline    Future Appointments  Date Time Provider Department Center  07/29/2023  2:00 PM [facility] PROVIDER 1 [facility]  07/29/2023  2:30 PM [facility] US4 [facility]  08/03/2023 11:10 AM Rasch, Delon FERNS, NP [facility]  08/11/2023 11:10 AM Cleatus Moccasin, MD [facility]  08/17/2023 11:10 AM Rasch, Delon FERNS, NP [facility]  08/24/2023 11:10 AM Rasch, Delon FERNS, NP [facility]  09/01/2023  11:10 AM Erik Kieth BROCKS, MD [facility]    Kieth BROCKS Erik, MD

## 2023-07-29 ENCOUNTER — Ambulatory Visit (HOSPITAL_BASED_OUTPATIENT_CLINIC_OR_DEPARTMENT_OTHER): Admitting: Maternal & Fetal Medicine

## 2023-07-29 ENCOUNTER — Other Ambulatory Visit: Payer: Self-pay | Admitting: *Deleted

## 2023-07-29 ENCOUNTER — Ambulatory Visit: Attending: Obstetrics and Gynecology

## 2023-07-29 VITALS — BP 121/71 | HR 98

## 2023-07-29 DIAGNOSIS — O24012 Pre-existing diabetes mellitus, type 1, in pregnancy, second trimester: Secondary | ICD-10-CM | POA: Diagnosis not present

## 2023-07-29 DIAGNOSIS — E1065 Type 1 diabetes mellitus with hyperglycemia: Secondary | ICD-10-CM | POA: Diagnosis present

## 2023-07-29 DIAGNOSIS — O360120 Maternal care for anti-D [Rh] antibodies, second trimester, not applicable or unspecified: Secondary | ICD-10-CM | POA: Diagnosis not present

## 2023-07-29 DIAGNOSIS — O0990 Supervision of high risk pregnancy, unspecified, unspecified trimester: Secondary | ICD-10-CM | POA: Diagnosis present

## 2023-07-29 DIAGNOSIS — O99210 Obesity complicating pregnancy, unspecified trimester: Secondary | ICD-10-CM

## 2023-07-29 DIAGNOSIS — O10012 Pre-existing essential hypertension complicating pregnancy, second trimester: Secondary | ICD-10-CM

## 2023-07-29 DIAGNOSIS — E669 Obesity, unspecified: Secondary | ICD-10-CM

## 2023-07-29 DIAGNOSIS — O99212 Obesity complicating pregnancy, second trimester: Secondary | ICD-10-CM

## 2023-07-29 DIAGNOSIS — Z6791 Unspecified blood type, Rh negative: Secondary | ICD-10-CM

## 2023-07-29 DIAGNOSIS — Z3A19 19 weeks gestation of pregnancy: Secondary | ICD-10-CM | POA: Diagnosis present

## 2023-07-29 DIAGNOSIS — I10 Essential (primary) hypertension: Secondary | ICD-10-CM | POA: Insufficient documentation

## 2023-07-30 ENCOUNTER — Other Ambulatory Visit: Payer: Self-pay | Admitting: Obstetrics and Gynecology

## 2023-07-31 NOTE — Progress Notes (Unsigned)
 PRENATAL VISIT NOTE  Subjective:  Cassandra [name] is a 23 y.o. G2P0010 at [redacted]w[redacted]d being seen today for ongoing prenatal care.  She is currently monitored for the following issues for this high-risk pregnancy and has Hypertension; Type 1 diabetes, uncontrolled, with neuropathy; Generalized anxiety disorder; Insomnia; Major depressive disorder; MRSA cellulitis; Hashimoto's disease; Symptomatic mammary hypertrophy; Post-surgical hypothyroidism; Post-surgical hypoparathyroidism; Endometriosis; Type 1 diabetes mellitus with hyperglycemia (HCC); Supervision of high risk pregnancy, antepartum; Rh negative state in antepartum period; and Obesity affecting pregnancy, antepartum on their problem list.  Patient reports no complaints.  Contractions: Not present. Vag. Bleeding: None.  Movement: Present. Denies leaking of fluid.   The following portions of the patient's history were reviewed and updated as appropriate: allergies, current medications, past family history, past medical history, past social history, past surgical history and problem list.   Objective:    Vitals:   08/03/23 1106  BP: 116/76  Pulse: (!) 103  Weight: 190 lb (86.2 kg)    Fetal Status:  Fetal Heart Rate (bpm): 154   Movement: Present    General: Alert, oriented and cooperative. Patient is in no acute distress.  Skin: Skin is warm and dry. No rash noted.   Cardiovascular: Normal heart rate noted  Respiratory: Normal respiratory effort, no problems with respiration noted  Abdomen: Soft, gravid, appropriate for gestational age.  Pain/Pressure: Absent     Pelvic: Cervical exam deferred        Extremities: Normal range of motion.  Edema: Trace  Mental Status: Normal mood and affect. Normal behavior. Normal judgment and thought content.   Assessment and Plan:  Pregnancy: G2P0010 at [redacted]w[redacted]d   1. Primary hypertension (Primary)  BP good Continue BASA.  No current antihypertensives.   2. Type 1 diabetes mellitus with  hyperglycemia (HCC)  T-Slim pump in place along with Dexcom CGM.   Overall baseline is <120 Continued persistent Meals peaks Will trial administering insulin  15 minutes before meals. 20 mins made her BS drop to low.   Recently treated for UTI- increased BS due to this.  With CGM, target Blood sugar set at 65-140.   Currently glucose in target range is 33% in target range   Basal rates:   12 am-1.25 units per hour> increase to 1.3 units per hour.  4 am-0.99 units per hour 6 am-1.15 units per hour 8 am-1.2 units per hour 12 pm-1.2 units per hour 3 pm- 1.5 units per hour 9 pm- 1.6 units per hour    Sensitivity/correction factor 25: Will decrease to 24, will decrease slowly by 1 every 4 days.    Continue Carb ratio:1:6, will decrease Carb Ratio to 1:5 due to meal peaks.    Continue Target BS 110  EFW 66%tile normal AFI.  Continue frequent OB visits until BS stable.   3. Post-surgical hypoparathyroidism  Continue close follow up with endocrinology Calcium  levels Q2-3 weeks, managed by endocrinology.  4. Post-surgical hypothyroidism  Most recent TSH normal.     Preterm labor symptoms and general obstetric precautions including but not limited to vaginal bleeding, contractions, leaking of fluid and fetal movement were reviewed in detail with the patient. Please refer to After Visit Summary for other counseling recommendations.   No follow-ups on file.  Future Appointments  Date Time Provider Department Center  08/11/2023 11:10 AM Cleatus Moccasin, MD [facility]  08/17/2023 11:10 AM Nikhil Osei, Delon FERNS, NP [facility]  08/24/2023 11:10 AM Cordarious Zeek, Delon FERNS, NP [facility]  08/26/2023  8:00 AM [facility]  PROVIDER 1 [facility]  08/26/2023  8:30 AM [facility] US4 [facility]  09/01/2023 11:10 AM Erik Kieth BROCKS, MD [facility]  09/30/2023  2:00 PM [facility] PROVIDER 1 [facility]  09/30/2023  3:00 PM [facility] US1 [facility]    Delon Emms, NP

## 2023-08-03 ENCOUNTER — Ambulatory Visit (INDEPENDENT_AMBULATORY_CARE_PROVIDER_SITE_OTHER): Admitting: Obstetrics and Gynecology

## 2023-08-03 VITALS — BP 116/76 | HR 103 | Wt 190.0 lb

## 2023-08-03 DIAGNOSIS — I10 Essential (primary) hypertension: Secondary | ICD-10-CM | POA: Diagnosis not present

## 2023-08-03 DIAGNOSIS — E892 Postprocedural hypoparathyroidism: Secondary | ICD-10-CM

## 2023-08-03 DIAGNOSIS — E1065 Type 1 diabetes mellitus with hyperglycemia: Secondary | ICD-10-CM

## 2023-08-03 DIAGNOSIS — E890 Postprocedural hypothyroidism: Secondary | ICD-10-CM

## 2023-08-03 MED ORDER — NYSTATIN 100000 UNIT/GM EX POWD: 1.0000 | Freq: Three times a day (TID) | CUTANEOUS | 2 refills | Status: AC

## 2023-08-09 NOTE — Progress Notes (Signed)
 After review, MFM consult with provider is not indicated for today  Cassandra Nathanel Pipe, MD 08/09/2023 11:18 AM  [facility]

## 2023-08-11 ENCOUNTER — Ambulatory Visit: Admitting: Obstetrics and Gynecology

## 2023-08-11 VITALS — BP 116/77 | HR 96 | Wt 190.0 lb

## 2023-08-11 DIAGNOSIS — I10 Essential (primary) hypertension: Secondary | ICD-10-CM

## 2023-08-11 DIAGNOSIS — E892 Postprocedural hypoparathyroidism: Secondary | ICD-10-CM

## 2023-08-11 DIAGNOSIS — E1065 Type 1 diabetes mellitus with hyperglycemia: Secondary | ICD-10-CM | POA: Diagnosis not present

## 2023-08-11 DIAGNOSIS — O0990 Supervision of high risk pregnancy, unspecified, unspecified trimester: Secondary | ICD-10-CM

## 2023-08-11 DIAGNOSIS — Z3A21 21 weeks gestation of pregnancy: Secondary | ICD-10-CM

## 2023-08-11 DIAGNOSIS — E890 Postprocedural hypothyroidism: Secondary | ICD-10-CM

## 2023-08-11 NOTE — Progress Notes (Signed)
 PRENATAL VISIT NOTE  Subjective:  Cassandra [name] is a 23 y.o. G2P0010 at [redacted]w[redacted]d being seen today for ongoing prenatal care.  She is currently monitored for the following issues for this high-risk pregnancy and has Hypertension; Type 1 diabetes, uncontrolled, with neuropathy; Generalized anxiety disorder; Insomnia; Major depressive disorder; MRSA cellulitis; Hashimoto's disease; Symptomatic mammary hypertrophy; Post-surgical hypothyroidism; Post-surgical hypoparathyroidism; Endometriosis; Type 1 diabetes mellitus with hyperglycemia (HCC); Supervision of high risk pregnancy, antepartum; Rh negative state in antepartum period; and Obesity affecting pregnancy, antepartum on their problem list.  Patient reports no complaints.  Contractions: Not present. Vag. Bleeding: None.  Movement: Present. Denies leaking of fluid.   The following portions of the patient's history were reviewed and updated as appropriate: allergies, current medications, past family history, past medical history, past social history, past surgical history and problem list.   Objective:    Vitals:   08/11/23 1107  BP: 116/77  Pulse: 96  Weight: 190 lb (86.2 kg)    Fetal Status:  Fetal Heart Rate (bpm): 150   Movement: Present    General: Alert, oriented and cooperative. Patient is in no acute distress.  Skin: Skin is warm and dry. No rash noted.   Cardiovascular: Normal heart rate noted  Respiratory: Normal respiratory effort, no problems with respiration noted  Abdomen: Soft, gravid, appropriate for gestational age.  Pain/Pressure: Absent     Pelvic: Cervical exam deferred        Extremities: Normal range of motion.  Edema: Trace  Mental Status: Normal mood and affect. Normal behavior. Normal judgment and thought content.   Assessment and Plan:  Pregnancy: G2P0010 at [redacted]w[redacted]d 1. Supervision of high risk pregnancy, antepartum (Primary) Anatomy incomplete, has f/u scheduled.   2. Type 1 diabetes mellitus with  hyperglycemia (HCC) T-Slim pump in place along with Dexcom CGM.  Fetal Echo is July 18th Average glucose is 161 (goal is 110)  Meals: Still overall elevated but so is her overall CBG at that time. Doing 10 minutes before 15 was still dropping her too low. Continue Carb ratio:1:5. Discussed that I think we need to continue to adjust her basal over her meals to bring the entire bar of CBGs down on reviewed her Dexcom data.     With CGM, target Blood sugar set at 65-140.   Currently glucose in target range is 39% in target range (improved from 33%!)   Basal rates: (Self adjusted last week - last adjustment was 3 days ago. She is going up by 20% where elevated every 4 days as directed by Dr. Erik. I agree with this plan. We discussed the focus for her adjustments would be the 6am, 8am, 12pm, and 3pm. Instructed her to watch the 9pm CBG and if concern for dropping to consider a protein snack prior to this time.    12 am-1.55 units per hour.  4 am-1.15 units per hour  6 am-1.15 units per hour 8 am-1.4 units per hour  12 pm-1.4 units per hour  3 pm- 1.8 units per hour  9 pm- 1.6 units per hour   She will adjust tomorrow based on our conversation as noted above.    3. Post-surgical hypothyroidism TSH low but free T4 normal in June.   4. Post-surgical hypoparathyroidism Managed by Endocrinologist.   5. Primary hypertension BP wnl.   6. Pregnancy with 21 completed weeks gestation   Preterm labor symptoms and general obstetric precautions including but not limited to vaginal bleeding, contractions, leaking of fluid and fetal  movement were reviewed in detail with the patient. Please refer to After Visit Summary for other counseling recommendations.   No follow-ups on file.  Future Appointments  Date Time Provider Department Center  08/17/2023 11:10 AM Rasch, Delon FERNS, NP [facility]  08/24/2023 11:10 AM Rasch, Delon FERNS, NP [facility]  08/26/2023  8:00 AM  [facility] PROVIDER 1 [facility]  08/26/2023  8:30 AM [facility] US4 [facility]  09/01/2023 11:10 AM Erik Kieth BROCKS, MD [facility]  09/30/2023  2:00 PM [facility] PROVIDER 1 [facility]  09/30/2023  3:00 PM [facility] US1 [facility]    Vina Solian, MD

## 2023-08-17 ENCOUNTER — Ambulatory Visit (INDEPENDENT_AMBULATORY_CARE_PROVIDER_SITE_OTHER): Admitting: Obstetrics and Gynecology

## 2023-08-17 ENCOUNTER — Inpatient Hospital Stay (HOSPITAL_COMMUNITY)
Admission: AD | Admit: 2023-08-17 | Discharge: 2023-08-17 | Disposition: A | Discharge status: Home / Self Care | Attending: Obstetrics and Gynecology | Admitting: Obstetrics and Gynecology

## 2023-08-17 ENCOUNTER — Encounter (HOSPITAL_COMMUNITY): Payer: Self-pay | Admitting: Obstetrics and Gynecology

## 2023-08-17 VITALS — BP 116/70 | HR 97 | Wt 194.0 lb

## 2023-08-17 DIAGNOSIS — Z794 Long term (current) use of insulin: Secondary | ICD-10-CM | POA: Diagnosis not present

## 2023-08-17 DIAGNOSIS — R0602 Shortness of breath: Secondary | ICD-10-CM

## 2023-08-17 DIAGNOSIS — Z79899 Other long term (current) drug therapy: Secondary | ICD-10-CM | POA: Insufficient documentation

## 2023-08-17 DIAGNOSIS — O10112 Pre-existing hypertensive heart disease complicating pregnancy, second trimester: Secondary | ICD-10-CM | POA: Diagnosis not present

## 2023-08-17 DIAGNOSIS — I10 Essential (primary) hypertension: Secondary | ICD-10-CM

## 2023-08-17 DIAGNOSIS — Z87891 Personal history of nicotine dependence: Secondary | ICD-10-CM | POA: Insufficient documentation

## 2023-08-17 DIAGNOSIS — Z3A22 22 weeks gestation of pregnancy: Secondary | ICD-10-CM | POA: Diagnosis not present

## 2023-08-17 DIAGNOSIS — O26892 Other specified pregnancy related conditions, second trimester: Secondary | ICD-10-CM

## 2023-08-17 DIAGNOSIS — O24012 Pre-existing diabetes mellitus, type 1, in pregnancy, second trimester: Secondary | ICD-10-CM | POA: Insufficient documentation

## 2023-08-17 DIAGNOSIS — E1065 Type 1 diabetes mellitus with hyperglycemia: Secondary | ICD-10-CM | POA: Diagnosis not present

## 2023-08-17 LAB — BLOOD GAS, VENOUS
Acid-Base Excess: 0 mmol/L (ref 0.0–2.0)
Bicarbonate: 24.8 mmol/L (ref 20.0–28.0)
Drawn by: 55810
O2 Saturation: 70.1 %
Patient temperature: 37
pCO2, Ven: 40 mmHg — ABNORMAL LOW (ref 44–60)
pH, Ven: 7.4 (ref 7.25–7.43)
pO2, Ven: 41 mmHg (ref 32–45)

## 2023-08-17 LAB — URINALYSIS, ROUTINE W REFLEX MICROSCOPIC
Bilirubin Urine: NEGATIVE
Glucose, UA: NEGATIVE mg/dL
Hgb urine dipstick: NEGATIVE
Ketones, ur: NEGATIVE mg/dL
Leukocytes,Ua: NEGATIVE
Nitrite: NEGATIVE
Protein, ur: NEGATIVE mg/dL
Specific Gravity, Urine: 1.013 (ref 1.005–1.030)
pH: 7 (ref 5.0–8.0)

## 2023-08-17 LAB — BETA-HYDROXYBUTYRIC ACID: Beta-Hydroxybutyric Acid: 0.15 mmol/L (ref 0.05–0.27)

## 2023-08-17 LAB — CBC
HCT: 35.7 % — ABNORMAL LOW (ref 36.0–46.0)
Hemoglobin: 12.1 g/dL (ref 12.0–15.0)
MCH: 28.3 pg (ref 26.0–34.0)
MCHC: 33.9 g/dL (ref 30.0–36.0)
MCV: 83.6 fL (ref 80.0–100.0)
Platelets: 254 K/uL (ref 150–400)
RBC: 4.27 MIL/uL (ref 3.87–5.11)
RDW: 13.9 % (ref 11.5–15.5)
WBC: 13.2 K/uL — ABNORMAL HIGH (ref 4.0–10.5)
nRBC: 0 % (ref 0.0–0.2)

## 2023-08-17 LAB — COMPREHENSIVE METABOLIC PANEL WITH GFR
ALT: 24 U/L (ref 0–44)
AST: 22 U/L (ref 15–41)
Albumin: 2.9 g/dL — ABNORMAL LOW (ref 3.5–5.0)
Alkaline Phosphatase: 72 U/L (ref 38–126)
Anion gap: 11 (ref 5–15)
BUN: 10 mg/dL (ref 6–20)
CO2: 22 mmol/L (ref 22–32)
Calcium: 8.7 mg/dL — ABNORMAL LOW (ref 8.9–10.3)
Chloride: 105 mmol/L (ref 98–111)
Creatinine, Ser: 0.67 mg/dL (ref 0.44–1.00)
GFR, Estimated: >60 mL/min (ref >60)
Glucose, Bld: 158 mg/dL — ABNORMAL HIGH (ref 70–99)
Potassium: 3.7 mmol/L (ref 3.5–5.1)
Sodium: 138 mmol/L (ref 135–145)
Total Bilirubin: 0.4 mg/dL (ref 0.0–1.2)
Total Protein: 6.9 g/dL (ref 6.5–8.1)

## 2023-08-17 LAB — BRAIN NATRIURETIC PEPTIDE: B Natriuretic Peptide: 73.1 pg/mL (ref 0.0–100.0)

## 2023-08-17 LAB — GLUCOSE, CAPILLARY: Glucose-Capillary: 164 mg/dL — ABNORMAL HIGH (ref 70–99)

## 2023-08-17 LAB — TSH: TSH: 0.2 u[IU]/mL — ABNORMAL LOW (ref 0.350–4.500)

## 2023-08-17 NOTE — MAU Note (Signed)
 Cassandra [name] is a 23 y.o. at [redacted]w[redacted]d here in MAU reporting: yesterday she started getting SOB, know this can be normal with pregnancy, but started happening when she was just sitting. Went to routine appt today, she sent her in for further eval, ? Clot, also her 'co2' has been running low.  No pain. Just feels like she can't get a good breath. No bleeding or LOF, normal d/c. No irritation. Is feeling fetal movement Onset of complaint: yesterday Pain score: none Vitals:   08/17/23 1400 08/17/23 1402  BP:  117/73  Pulse:  (!) 102  Resp:  20  Temp:  98.8 F (37.1 C)  SpO2: 99% 99%     FHT:153 Lab orders placed from triage:  urine collected

## 2023-08-17 NOTE — Progress Notes (Signed)
 PRENATAL VISIT NOTE  Subjective:  Cassandra [name] is a 23 y.o. G2P0010 at [redacted]w[redacted]d being seen today for ongoing prenatal care.  She is currently monitored for the following issues for this high-risk pregnancy and has Hypertension; Type 1 diabetes, uncontrolled, with neuropathy; Generalized anxiety disorder; Insomnia; Major depressive disorder; MRSA cellulitis; Hashimoto's disease; Symptomatic mammary hypertrophy; Post-surgical hypothyroidism; Post-surgical hypoparathyroidism; Endometriosis; Type 1 diabetes mellitus with hyperglycemia (HCC); Supervision of high risk pregnancy, antepartum; Rh negative state in antepartum period; and Obesity affecting pregnancy, antepartum on their problem list.  Patient reports SOB- No chest pain, symptoms worsening .  Contractions: Not present. Vag. Bleeding: None.  Movement: Present. Denies leaking of fluid.   The following portions of the patient's history were reviewed and updated as appropriate: allergies, current medications, past family history, past medical history, past social history, past surgical history and problem list.   Objective:    Vitals:   08/17/23 1127  BP: 116/70  Pulse: 97  Weight: 194 lb (88 kg)    Fetal Status:  Fetal Heart Rate (bpm): 151   Movement: Present    General: Alert, oriented and cooperative. Patient is in no acute distress.  Skin: Skin is warm and dry. No rash noted.   Cardiovascular: Normal heart rate noted  Respiratory: Normal respiratory effort, no problems with respiration noted  Abdomen: Soft, gravid, appropriate for gestational age.  Pain/Pressure: Absent     Pelvic: Cervical exam deferred        Extremities: Normal range of motion.  Edema: Trace  Mental Status: Normal mood and affect. Normal behavior. Normal judgment and thought content.   Assessment and Plan:  Pregnancy: G2P0010 at [redacted]w[redacted]d  1. Shortness of breath (Primary)  Started to worsen yesterday. Occurred some last week. Feeling this more  reguarly. Cannot get a good breath. Feels this when she is sitting down.  Pulse ox 98% on RA Had her walk with pulse on ox in the office and HR went from 100-130's, felt more SOB at this time.   Recommend further evaluation by [facility] providers.  Could be normal SOB with pregnancy, although given her extensive medical history further evaluation would be helpful as a PE cannot be ruled out at this time.   2. Type 1 diabetes mellitus with hyperglycemia (HCC)  -T Slim pump in place along with Dexcom CGM.  Sugars are improving. Up to 41% in target range on Dexcom (65-140) This is up from 39% last week. Making great progress.   -She requests to continue weekly visits. She is very happy with her progress and would like to continue until things improve even more.   Basal rates below:  12 am-1.55 units per hour., Will increase to 1.65 units per hour.   4 am-1.35 units per hour, will increase to 1.4 units per hour.   6 am-1.35 units per hour, will increase to 1.4 units per hour.   8 am-1.65 units per hour, will increase to 1.75 units per hour.   12 pm-1.65 units per hour, will increase to 1.75 units per hour.   3 pm- 1.8 units per hour, will increase to 1.90 units per hour.   9 pm- 1.6 units per hour, No changes, slight drop around 9 pm routinely. Likely related to high carb dinners (pizza) and over correcting.   She is regularly eating a bedtime snack.   Continue Sensitivity/correction of 24 Continue Carb ratio:1:5. Continue Target BS 110- Would consider decreasing this to 105 if she agrees.  Fetal Echo scheduled.  3. Primary hypertension  BP normal  Continue BASA    Preterm labor symptoms and general obstetric precautions including but not limited to vaginal bleeding, contractions, leaking of fluid and fetal movement were reviewed in detail with the patient. Please refer to After Visit Summary for other counseling recommendations.   No follow-ups on file.  Future Appointments  Date  Time Provider Department Center  08/24/2023 11:10 AM Nallely Yost, Delon FERNS, NP [facility]  08/26/2023  8:00 AM [facility] PROVIDER 1 [facility]  08/26/2023  8:30 AM [facility] US4 [facility]  09/01/2023 11:10 AM Erik Kieth BROCKS, MD [facility]  09/30/2023  2:00 PM [facility] PROVIDER 1 [facility]  09/30/2023  3:00 PM [facility] US1 [facility]    Delon Emms, NP

## 2023-08-17 NOTE — MAU Provider Note (Signed)
 Chief Complaint: Shortness of Breath   Event Date/Time   First Provider Initiated Contact with Patient 08/17/23 1504      SUBJECTIVE HPI: Cassandra [name] is a 23 y.o. G2P0010 at [redacted]w[redacted]d by early ultrasound who presents to maternity admissions reporting onset of SOB yesterday. PMH of T1DM, hx of HTN, inappropriate sinus tachycardia (previously on metoprolol  but d/c this pregnancy). Overall, she reports she is feeling unwell. Yesterday, she experienced an onset of SOB with associated nausea. Her SOB has continued today without nausea. She has had episodes of tachycardia 120-130s predating this new SOB, primarily associated with exertion/ambulation. No episodes of HR >160. She reports she does not feel like she is in DKA but she does not feel right. She cannot describe it. No sick symptoms or sick contacts. No chest pain. Some mild substernal chest tightness without exacerbating or relieving symptoms.   No personal history of blood clots. No family history of blood clots. Many family members with SVT and family history of CAD.     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/25/2019   S/P thyroidectomy 01/27/2019   Symptomatic mammary  hypertrophy 03/17/2017   Thyroid  goiter 05/09/2018   Type 1 diabetes, uncontrolled, 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   Past Surgical History:  Procedure Laterality Date   ADENOIDECTOMY     CHOLECYSTECTOMY     COLONOSCOPY     ELPP Surgery     ESOPHAGOGASTRODUODENOSCOPY     SPHINCTEROTOMY  03/16/2023   THYROIDECTOMY N/A 01/25/2019   Procedure: COMPLETE THYROIDECTOMY;  Surgeon: Karis Clunes, MD;  Location: [facility] OR;  Service: ENT;  Laterality: N/A;   TOE SURGERY Bilateral    for ingrown toenails   TYMPANOSTOMY TUBE PLACEMENT     WISDOM TOOTH EXTRACTION     Social History   Socioeconomic History   Marital status: Single    Spouse name: Not on file   Number of children: Not on file   Years of education: 12   Highest education level: High school graduate  Occupational History   Not on file  Tobacco Use   Smoking status: Former    Current packs/day: 0.00    Average packs/day: 0.1 packs/day for 5.0 years (0.7 ttl pk-yrs)    Types: Cigarettes    Start date: 01/22/2014    Quit date: 01/23/2019    Years since quitting: 4.5    Passive exposure: Current (mom smokes)   Smokeless tobacco: Never  Vaping Use   Vaping status: Former  Substance and Sexual Activity   Alcohol use: No   Drug use: No   Sexual activity: Yes    Birth control/protection: None  Other Topics Concern   Not on file  Social History Narrative  Pt lives with mother. Mother is a Midwife.  They have two dogs and three cats. Patient is graduate from [employer]; Pt enjoys softball, sleep, and be with family.    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    Social Drivers of Health   Financial Resource Strain: Low Risk  (08/16/2023)   Received from [facility]   Overall Financial Resource Strain (CARDIA)    How hard is it for you to pay for the very basics like food, housing, medical care, and heating?: Not very hard  Food Insecurity: Patient Declined (08/16/2023)    Received from [facility]   Hunger Vital Sign    Within the past 12 months, you worried that your food would run out before you got the money to buy more.: Patient declined    Within the past 12 months, the food you bought just didn't last and you didn't have money to get more.: Patient declined  Transportation Needs: No Transportation Needs (08/16/2023)   Received from [facility] - Transportation    In the past 12 months, has lack of transportation kept you from medical appointments or from getting medications?: No    In the past 12 months, has lack of transportation kept you from meetings, work, or from getting things needed for daily living?: No  Physical Activity: Insufficiently Active (08/16/2023)   Received from [facility]   Exercise Vital Sign    On average, how many days per week do you engage in moderate to strenuous exercise (like a brisk walk)?: 3 days    On average, how many minutes do you engage in exercise at this level?: 30 min  Stress: No Stress Concern Present (08/16/2023)   Received from [facility] - Occupational Stress Questionnaire    Do you feel stress - tense, restless, nervous, or anxious, or unable to sleep at night because your mind is troubled all the time - these days?: Only a little  Social Connections: Moderately Integrated (08/16/2023)   Received from [facility]   Social Network    How would you rate your social network (family, work, friends)?: Adequate participation with social networks  Intimate Partner Violence: Not At Risk (08/16/2023)   Received from [facility]   Humiliation, Afraid, Rape, and Kick questionnaire    Within the last year, have you been afraid of your partner or ex-partner?: No    Within the last year, have you been humiliated or emotionally abused in other ways by your partner or ex-partner?: No    Within the last year, have you been kicked, hit, slapped, or otherwise  physically hurt by your partner or ex-partner?: No    Within the last year, have you been raped or forced to have any kind of sexual activity by your partner or ex-partner?: No   No current facility-administered medications on file prior to encounter.   Current Outpatient Medications on File Prior to Encounter  Medication Sig Dispense Refill   amitriptyline  (ELAVIL ) 75 MG tablet Take 1 tablet (75 mg total) by mouth at bedtime. 90 tablet 1   aspirin  EC 81 MG tablet Take 1 tablet (81 mg total) by mouth daily. Swallow whole. 30 tablet 12   calcitRIOL  (ROCALTROL ) 0.5 MCG capsule Take 0.5 mcg by mouth daily.     calcium  carbonate (OSCAL) 1500 (600 Ca) MG TABS tablet Take 1,500 mg by mouth  in the morning, at noon, and at bedtime.     citalopram  (CELEXA ) 10 MG tablet Take 10 mg by mouth daily.     levothyroxine  (SYNTHROID ) 200 MCG tablet Take 200 mcg by mouth daily.     Melatonin 1 MG CAPS Take by mouth.     Prenatal Vit-Fe Fumarate-FA (MULTIVITAMIN-PRENATAL) 27-0.8 MG TABS tablet Take 1 tablet by mouth daily at 12 noon.     tamsulosin  (FLOMAX ) 0.4 MG CAPS capsule Take 1 capsule (0.4 mg total) by mouth daily. 30 capsule 2   Blood Glucose Monitoring Suppl (ACCU-CHEK GUIDE) w/Device KIT 1 kit by Does not apply route daily as needed. 2 kit 5   Continuous Blood Gluc Receiver (DEXCOM G6 RECEIVER) DEVI 1 Device by Does not apply route daily as needed. 1 each 0   Continuous Blood Gluc Sensor (DEXCOM G6 SENSOR) MISC CHANGE SENSOR EVERY 10 DAYS AS DIRECTED. 3 each 5   Continuous Blood Gluc Transmit (DEXCOM G6 TRANSMITTER) MISC 1 kit by Does not apply route daily as needed. 1 each 1   famotidine  (PEPCID ) 20 MG tablet Take 1 tablet (20 mg total) by mouth 2 (two) times daily. 60 tablet 3   Glucagon  (BAQSIMI  TWO PACK) 3 MG/DOSE POWD Place 1 each into the nose as needed (severe hypoglycmia with unresponsiveness). 1 each 3   glycopyrrolate  (ROBINUL ) 2 MG tablet Take 1 tablet (2 mg total) by mouth 3 (three) times  daily as needed. 30 tablet 3   insulin  degludec (TRESIBA ) 200 UNIT/ML FlexTouch Pen Inject into the skin. (Patient not taking: Reported on 08/17/2023)     insulin  lispro (HUMALOG ) 100 UNIT/ML injection INJECT UP TO 300 UNITS IN INSULIN  PUMP EVERY 48 HOURS, PER DKA AND HYPERGLYCEMIA PROTOCOLS 40 mL 5   Insulin  Syringe-Needle U-100 (INSULIN  SYRINGE .3CC/29GX1/2) 29G X 1/2 0.3 ML MISC 1 each by Does not apply route 6 (six) times daily. (Patient not taking: Reported on 08/17/2023) 200 each 6   metoprolol  tartrate (LOPRESSOR ) 50 MG tablet Take 50 mg by mouth 2 (two) times daily. (Patient not taking: Reported on 08/17/2023)     nitrofurantoin , macrocrystal-monohydrate, (MACROBID ) 100 MG capsule Take 1 capsule (100 mg total) by mouth 2 (two) times daily. (Patient not taking: Reported on 08/17/2023) 14 capsule 0   nystatin  powder Apply 1 Application topically 3 (three) times daily. 15 g 2   ondansetron  (ZOFRAN -ODT) 4 MG disintegrating tablet Take 1 tablet (4 mg total) by mouth every 6 (six) hours as needed for nausea. (Patient not taking: Reported on 08/17/2023) 20 tablet 3   Ostomy Supplies (ADHESIVE REMOVER WIPES) MISC Use as directed for pump and CGM sites. 50 each 3   promethazine  (PHENERGAN ) 25 MG tablet Take 1 tablet (25 mg total) by mouth every 6 (six) hours as needed for nausea or vomiting. (Patient not taking: Reported on 08/17/2023) 30 tablet 1   scopolamine  (TRANSDERM-SCOP) 1 MG/3DAYS Place 1 patch (1.5 mg total) onto the skin every 3 (three) days. (Patient not taking: Reported on 08/17/2023) 10 patch 3   Allergies  Allergen Reactions   Shellfish-Derived Products Diarrhea and Nausea And Vomiting   Tape Itching    Medical tape causes itching   Amoxicillin-Pot Clavulanate Nausea And Vomiting and Other (See Comments)   Lactose Intolerance (Gi)    Shellfish Allergy Nausea And Vomiting and Other (See Comments)   Tilactase Nausea And Vomiting   Latex Rash    ROS:  Pertinent positives/negatives  listed above.  I have reviewed patient's Past Medical Hx, Surgical  Hx, Family Hx, Social Hx, medications and allergies.   Physical Exam  Patient Vitals for the past 24 hrs:  BP Temp Temp src Pulse Resp SpO2 Height Weight  08/17/23 1546 120/76 -- -- (!) 107 -- 98 % -- --  08/17/23 1543 109/67 -- -- 94 -- -- -- --  08/17/23 1541 114/70 -- -- 88 -- -- -- --  08/17/23 1540 119/67 -- -- 84 -- 98 % -- --  08/17/23 1431 112/78 -- -- (!) 107 -- 100 % -- --  08/17/23 1402 117/73 98.8 F (37.1 C) Oral (!) 102 20 99 % 5' 1 (1.549 m) 88.9 kg  08/17/23 1400 -- -- -- -- -- 99 % -- --   Constitutional: Well-developed, well-nourished female in no acute distress.  Cardiovascular: normal rate on my exam, no m/r/g, regular rhythm Respiratory: normal effort, CTAB GI: Abd soft, non-tender MS: trace edema, calves symmetric  Doppler: 153  LAB RESULTS Results for orders placed or performed during the hospital encounter of 08/17/23 (from the past 24 hours)  Glucose, capillary     Status: Abnormal   Collection Time: 08/17/23  2:27 PM  Result Value Ref Range   Glucose-Capillary 164 (H) 70 - 99 mg/dL  Urinalysis, Routine w reflex microscopic -Urine, Clean Catch     Status: Abnormal   Collection Time: 08/17/23  2:37 PM  Result Value Ref Range   Color, Urine YELLOW YELLOW   APPearance HAZY (A) CLEAR   Specific Gravity, Urine 1.013 1.005 - 1.030   pH 7.0 5.0 - 8.0   Glucose, UA NEGATIVE NEGATIVE mg/dL   Hgb urine dipstick NEGATIVE NEGATIVE   Bilirubin Urine NEGATIVE NEGATIVE   Ketones, ur NEGATIVE NEGATIVE mg/dL   Protein, ur NEGATIVE NEGATIVE mg/dL   Nitrite NEGATIVE NEGATIVE   Leukocytes,Ua NEGATIVE NEGATIVE  Blood gas, venous     Status: Abnormal   Collection Time: 08/17/23  3:27 PM  Result Value Ref Range   pH, Ven 7.4 7.25 - 7.43   pCO2, Ven 40 (L) 44 - 60 mmHg   pO2, Ven 41 32 - 45 mmHg   Bicarbonate 24.8 20.0 - 28.0 mmol/L   Acid-Base Excess 0.0 0.0 - 2.0 mmol/L   O2 Saturation 70.1 %    Patient temperature 37.0    Drawn by [id number]   Comprehensive metabolic panel     Status: Abnormal   Collection Time: 08/17/23  3:27 PM  Result Value Ref Range   Sodium 138 135 - 145 mmol/L   Potassium 3.7 3.5 - 5.1 mmol/L   Chloride 105 98 - 111 mmol/L   CO2 22 22 - 32 mmol/L   Glucose, Bld 158 (H) 70 - 99 mg/dL   BUN 10 6 - 20 mg/dL   Creatinine, Ser 9.32 0.44 - 1.00 mg/dL   Calcium  8.7 (L) 8.9 - 10.3 mg/dL   Total Protein 6.9 6.5 - 8.1 g/dL   Albumin 2.9 (L) 3.5 - 5.0 g/dL   AST 22 15 - 41 U/L   ALT 24 0 - 44 U/L   Alkaline Phosphatase 72 38 - 126 U/L   Total Bilirubin 0.4 0.0 - 1.2 mg/dL   GFR, Estimated >39 >39 mL/min   Anion gap 11 5 - 15  Beta-hydroxybutyric acid     Status: None   Collection Time: 08/17/23  3:27 PM  Result Value Ref Range   Beta-Hydroxybutyric Acid 0.15 0.05 - 0.27 mmol/L  TSH     Status: Abnormal   Collection Time: 08/17/23  3:27  PM  Result Value Ref Range   TSH 0.200 (L) 0.350 - 4.500 uIU/mL  CBC     Status: Abnormal   Collection Time: 08/17/23  3:27 PM  Result Value Ref Range   WBC 13.2 (H) 4.0 - 10.5 K/uL   RBC 4.27 3.87 - 5.11 MIL/uL   Hemoglobin 12.1 12.0 - 15.0 g/dL   HCT 64.2 (L) 63.9 - 53.9 %   MCV 83.6 80.0 - 100.0 fL   MCH 28.3 26.0 - 34.0 pg   MCHC 33.9 30.0 - 36.0 g/dL   RDW 86.0 88.4 - 84.4 %   Platelets 254 150 - 400 K/uL   nRBC 0.0 0.0 - 0.2 %  Brain natriuretic peptide     Status: None   Collection Time: 08/17/23  3:27 PM  Result Value Ref Range   B Natriuretic Peptide 73.1 0.0 - 100.0 pg/mL    A/Negative/-- (05/15 1658)  IMAGING US  MFM OB DETAIL +14 WK Result Date: 07/29/2023 ----------------------------------------------------------------------  OBSTETRICS REPORT                       (Signed Final 07/29/2023 04:20 pm) ---------------------------------------------------------------------- Patient Info  ID #:       [id number]                          D.O.B.:  11-03-2000 (23 yrs)(F)  Name:       Cassandra [name]                Visit Date: 07/29/2023 03:12 pm              Megna ---------------------------------------------------------------------- Performed By  Attending:        Nathanel Fetters      Ref. Address:     [address]                    [place]                                                             [place], [place]  Performed By:     Elenor Edu BS      [facility]                                                             [facility]  Referred By:      RANEE Lofts ---------------------------------------------------------------------- Orders  #  Description                           Code        Ordered By  1  US  MFM OB DETAIL +14 WK               23188.98    VINA SOLIAN ----------------------------------------------------------------------  #  Order #                     Accession #                Episode #  1  [id number]                   [id number]                 [id number] ---------------------------------------------------------------------- Indications  [redacted] weeks gestation of pregnancy                Z3A.19  Antenatal screening for malformations          Z36.3  Pre-existing diabetes, type 1, in pregnancy,   O24.012  second trimester  Hypertension - Chronic/Pre-existing (no        O10.019  meds)  Obesity complicating pregnancy, second         O99.212  trimester  Rh negative state in antepartum                O36.0190  Neg Horizon/AFP  Abnormal finding on antenatal screening        O28.9  (low FF - no redraw per Jennell) ---------------------------------------------------------------------- Fetal Evaluation  Num Of Fetuses:         1  Fetal Heart Rate(bpm):  157  Cardiac Activity:       Observed  Presentation:           Variable  Placenta:               Anterior  P. Cord Insertion:      Visualized  Amniotic Fluid  AFI FV:      Within normal limits                               Largest Pocket(cm)                              6.53 ---------------------------------------------------------------------- Biometry  BPD:      43.6  mm     G. Age:  19w 1d         40  %    CI:        69.18   %    70 - 86                                                          FL/HC:      17.7   %    16.1 - 18.3  HC:      167.4  mm     G. Age:  19w 3d         41  %  HC/AC:      1.10        1.09 - 1.39  AC:      152.3  mm     G. Age:  20w 3d         78  %    FL/BPD:     67.9   %  FL:       29.6  mm     G. Age:  19w 1d         32  %    FL/AC:      19.4   %    20 - 24  CER:      20.2  mm     G. Age:  19w 3d         56  %  LV:        7.1  mm  Est. FW:     312  gm    0 lb 11 oz      66  % ---------------------------------------------------------------------- OB History  Gravidity:    2         Term:   0        Prem:   0        SAB:   1  TOP:          0       Ectopic:  0        Living: 0 ---------------------------------------------------------------------- Gestational Age  LMP:           19w 3d        Date:  03/15/23                  EDD:   12/20/23  U/S Today:     19w 4d                                        EDD:   12/19/23  Best:          19w 3d     Det. By:  LMP  (03/15/23)          EDD:   12/20/23 ---------------------------------------------------------------------- Targeted Anatomy  Central Nervous System  Calvarium/Cranial V.:  Appears normal         Cereb./Vermis:          Appears normal  Cavum:                 Appears normal         Cisterna Magna:         Appears normal  Lateral Ventricles:    Appears normal         Midline Falx:           Appears normal  Choroid Plexus:        Appears normal  Spine  Cervical:              Appears normal         Sacral:                 Appears normal  Thoracic:              Appears normal         Shape/Curvature:        Appears normal  Lumbar:                Appears normal  Head/Neck  Lips:                  Appears normal         Profile:                 Appears normal  Neck:                  Appears normal         Orbits/Eyes:            Appears normal  Nuchal Fold:           Appears normal         Mandible:               Appears normal  Nasal Bone:            Present                Maxilla:                Appears normal  Thorax  4 Chamber View:        Appears normal         Interventr. Septum:     Appears normal  Cardiac Rhythm:        Normal                 Cardiac Axis:           Normal  Cardiac Situs:         Appears normal         Diaphragm:              Appears normal  Rt Outflow Tract:      Appears normal         3 Vessel View:          Appears normal  Lt Outflow Tract:      Appears normal         3 V Trachea View:       Appears normal  Aortic Arch:           Appears normal         IVC:                    Appears normal  Ductal Arch:           Appears normal         Crossing:               Appears normal  SVC:                   Appears normal  Abdomen  Ventral Wall:          Appears normal         Lt Kidney:              Appears normal  Cord Insertion:        Appears normal         Rt Kidney:              Appears normal  Situs:                 Appears normal         Bladder:                Appears normal  Stomach:               Appears  normal  Extremities  Lt Humerus:            Appears normal         Lt Femur:               Appears normal  Rt Humerus:            Appears normal         Rt Femur:               Appears normal  Lt Forearm:            Appears normal         Lt Lower Leg:           Appears normal  Rt Forearm:            Appears normal         Rt Lower Leg:           Appears normal  Lt Hand:               Not well visualized    Lt Foot:                Heel visualized  Rt Hand:               Not well visualized    Rt Foot:                Foot visualized  Other  Umbilical Cord:        Normal 3-vessel        Genitalia:              Female-nml ---------------------------------------------------------------------- Cervix Uterus Adnexa  Cervix   Length:            3.8  cm.  Normal appearance by transabdominal scan  Uterus  No abnormality visualized.  Right Ovary  Not visualized.  Left Ovary  Not visualized.  Cul De Sac  No free fluid seen.  Adnexa  No abnormality visualized ---------------------------------------------------------------------- Impression  Single intrauterine pregnancy here for a detailed anatomy  due to type 2 diabetes  Normal anatomy with measurements consistent with dates  There is good fetal movement and amniotic fluid volume  Suboptimal views of the fetal anatomy were obtained  secondary to fetal position.  We discussed the increased risk for fetal macrosomia,  preeclampsia, cardiac defects, maternal and fetal birth  trauma with temporary and/or permenant damage, stillbirth  and neonatal ICU admission.  I reviewed today's study and recommend serial growth, fetal  echocardiogram and initiation of weekly testing at 32 weeks.  Delivery between 37-39 weeks.  In addition we discussed starting daily low dose ASA for the  prevention of preeclampsia.  We discussed goals of FBS < 90 and 2hr pp < 120 mg/dL  All questions answered.  Nathanel DOROTHA Fetters, MD ---------------------------------------------------------------------- Recommendations  Follow ug growth in 4 weeks.  Fetal echocardiogram referral sent to [facility]. ----------------------------------------------------------------------              Nathanel Fetters, MD Electronically Signed Final Report   07/29/2023 04:20 pm ----------------------------------------------------------------------   CT RENAL STONE STUDY Result Date: 07/20/2023 EXAM: CT UROGRAM 07/20/2023 06:50:00 PM TECHNIQUE: CT of the abdomen and pelvis was performed before and after the administration of intravenous contrast as per CT urogram protocol. Multiplanar reformatted images as well as MIP urogram images are provided for review. Automated exposure control, iterative reconstruction, and/or weight based adjustment of the  mA/kV was utilized to reduce  the radiation dose to as low as reasonably achievable. COMPARISON: None available. CLINICAL HISTORY: Nephrolithiasis suspected and pregnant. Dysuria; Vaginal Bleeding; CT RENAL STONE STUDY; nephrolithiasis suspected and pregnant. FINDINGS: LOWER CHEST: No acute abnormality. LIVER: The liver is unremarkable. GALLBLADDER AND BILE DUCTS: Status post cholecystectomy. No biliary ductal dilatation. SPLEEN: No acute abnormality. PANCREAS: No acute abnormality. ADRENAL GLANDS: No acute abnormality. KIDNEYS, URETERS AND BLADDER: No renal, ureteral, or bladder calculi. No hydronephrosis. No perinephric or periureteral stranding. Urinary bladder is unremarkable. GI AND BOWEL: Stomach demonstrates no acute abnormality. There is no bowel obstruction. Normal appendix (coronal image 64). PERITONEUM AND RETROPERITONEUM: No ascites. No free air. VASCULATURE: Aorta is normal in caliber. LYMPH NODES: No lymphadenopathy. REPRODUCTIVE ORGANS: Gravid uterus. The placenta is anterior and free of the cervical os. BONES AND SOFT TISSUES: No acute osseous abnormality. No focal soft tissue abnormality. IMPRESSION: 1. Gravid uterus. Anterior placenta, free of the cervical os. 2. Otherwise negative CT abdomen/pelvis. Electronically signed by: Pinkie Pebbles MD 07/20/2023 07:11 PM EDT RP Workstation: [id number]   US  RENAL Result Date: 07/20/2023 CLINICAL DATA:  Hematuria.  Bladder spasms. EXAM: RENAL / URINARY TRACT ULTRASOUND COMPLETE COMPARISON:  None Available. FINDINGS: Right Kidney: Renal measurements: 10.3 x 4.6 x 5.5 cm = volume: 138.7 mL. Echogenicity within normal limits. Suspected mild right hydronephrosis. No mass visualized. Left Kidney: Renal measurements: 10.9 x 5.2 x 5.3 cm = volume: 156.7 mL. Echogenicity within normal limits. No mass or hydronephrosis visualized. Bladder: Appears normal for degree of bladder distention. Other: None. IMPRESSION: Suspected mild right hydronephrosis. Electronically  Signed   By: Harrietta Sherry M.D.   On: 07/20/2023 16:41    MAU Management/MDM: Orders Placed This Encounter  Procedures   Glucose, capillary   Blood gas, venous   Comprehensive metabolic panel   Beta-hydroxybutyric acid   Urinalysis, Routine w reflex microscopic -Urine, Clean Catch   TSH   CBC   Brain natriuretic peptide   EKG 12-Lead   Discharge patient    No orders of the defined types were placed in this encounter.   - DDX: physiologic SOB in pregnancy, exacerbation of inappropriate sinus tach, DKA, SVT - very low suspicion for PE given lack of pleuritic chest pain, no signs of DVT, history of clots, more probable cause of tachycardia, and EKG without signs of PE - VBG with pCO2 of 40, very low concern for DKA at this point - UA negative - EKG normal sinus rhythm - CMP with expected elevated glucose and appropriate albumin-corrected calcium  - CBC appropriative for GA - BNP wnl - beta hydroxybutyric acid wnl - TSH 0.2, continue on levothyroxine  200 mcg with further management per OP provider - provided SOB return precautions - Follow up with [facility] for routine OB care  ASSESSMENT 1. Shortness of breath due to pregnancy in second trimester   2. [redacted] weeks gestation of pregnancy   3. Type 1 diabetes mellitus affecting pregnancy in second trimester, antepartum     PLAN - Follow up with [facility] for routine OB care - Discharge home with strict return precautions. Allergies as of 08/17/2023       Reactions   Shellfish-derived Products Diarrhea, Nausea And Vomiting   Tape Itching   Medical tape causes itching   Amoxicillin-pot Clavulanate Nausea And Vomiting, Other (See Comments)   Lactose Intolerance (gi)    Shellfish Allergy Nausea And Vomiting, Other (See Comments)   Tilactase Nausea And Vomiting   Latex Rash        Medication List  STOP taking these medications    glycopyrrolate  2 MG tablet Commonly known as: ROBINUL    metoprolol  tartrate 50 MG  tablet Commonly known as: LOPRESSOR    nitrofurantoin  (macrocrystal-monohydrate) 100 MG capsule Commonly known as: MACROBID    scopolamine  1 MG/3DAYS Commonly known as: TRANSDERM-SCOP       TAKE these medications    Accu-Chek Guide w/Device Kit 1 kit by Does not apply route daily as needed.   Adhesive Remover Wipes Misc Use as directed for pump and CGM sites.   amitriptyline  75 MG tablet Commonly known as: ELAVIL  Take 1 tablet (75 mg total) by mouth at bedtime.   aspirin  EC 81 MG tablet Take 1 tablet (81 mg total) by mouth daily. Swallow whole.   Baqsimi  Two Pack 3 MG/DOSE Powd Generic drug: Glucagon  Place 1 each into the nose as needed (severe hypoglycmia with unresponsiveness).   calcitRIOL  0.5 MCG capsule Commonly known as: ROCALTROL  Take 0.5 mcg by mouth daily.   calcium  carbonate 1500 (600 Ca) MG Tabs tablet Commonly known as: OSCAL Take 1,500 mg by mouth in the morning, at noon, and at bedtime.   citalopram  10 MG tablet Commonly known as: CELEXA  Take 10 mg by mouth daily.   Dexcom G6 Receiver Devi 1 Device by Does not apply route daily as needed.   Dexcom G6 Sensor Misc CHANGE SENSOR EVERY 10 DAYS AS DIRECTED.   Dexcom G6 Transmitter Misc 1 kit by Does not apply route daily as needed.   famotidine  20 MG tablet Commonly known as: Pepcid  Take 1 tablet (20 mg total) by mouth 2 (two) times daily.   insulin  degludec 200 UNIT/ML FlexTouch Pen Commonly known as: TRESIBA  Inject into the skin.   insulin  lispro 100 UNIT/ML injection Commonly known as: HumaLOG  INJECT UP TO 300 UNITS IN INSULIN  PUMP EVERY 48 HOURS, PER DKA AND HYPERGLYCEMIA PROTOCOLS   INSULIN  SYRINGE .3CC/29GX1/2 29G X 1/2 0.3 ML Misc 1 each by Does not apply route 6 (six) times daily.   levothyroxine  200 MCG tablet Commonly known as: SYNTHROID  Take 200 mcg by mouth daily.   Melatonin 1 MG Caps Take by mouth.   multivitamin-prenatal 27-0.8 MG Tabs tablet Take 1 tablet by mouth  daily at 12 noon.   nystatin  powder Commonly known as: nystatin  Apply 1 Application topically 3 (three) times daily.   ondansetron  4 MG disintegrating tablet Commonly known as: ZOFRAN -ODT Take 1 tablet (4 mg total) by mouth every 6 (six) hours as needed for nausea.   promethazine  25 MG tablet Commonly known as: PHENERGAN  Take 1 tablet (25 mg total) by mouth every 6 (six) hours as needed for nausea or vomiting.   tamsulosin  0.4 MG Caps capsule Commonly known as: FLOMAX  Take 1 capsule (0.4 mg total) by mouth daily.        Follow-up Information     [facility]              Follow up.   Why: for routinely scheduled OB appointment Contact information: [place]                 Briawna Carver Alena Morrison, MD Family Medicine PGY1 08/17/2023  5:04 PM

## 2023-08-20 MED ORDER — FLUTICASONE PROPIONATE 50 MCG/ACT NA SUSP: 2.0000 | Freq: Every day | NASAL | 3 refills | Status: AC

## 2023-08-20 NOTE — Progress Notes (Signed)
 [facility]  [address] [place] [address] Ph: [phone]  Fax: [phone] Appt: [phone]      Date   08/20/2023  Patient information   Cassandra [name] [address] [place] [address] T: [phone]  E: [email or web address] Date of birth: April 29, 2000  Age: 23 y.o.  Requesting provider   Kizzie Nathanel Pipe, MD [address] [facility] [place],  [place] [address] T: [phone]  F: [phone]  Chief complaint   Chief Complaint  Patient presents with  . Diabetic FETAL ECHO     History of present illness  I had the pleasure of seeing Cassandra [name] at the [employer] office for fetal cardiac consultation and fetal echocardiography at the request of Kizzie Nathanel Novak*. Records were reviewed, and a summary of those records is integrated within the history of present illness. History is obtained from chart review and patient.  Cassandra [name] is a 23 y.o. year old G2P1 woman currently [redacted]w[redacted]d with a single female fetus. Estimated Date of Delivery: 12/17/23.  The indication for today's visit includes type 1 DM. She reports improving blood sugar control. Her most recent A1c on 5/15 was 7.4.   Complications of her current pregnancy include hypertension. She denies any other complications with this pregnancy. She has felt good fetal movement. She plans to deliver at [facility]. The available medical record was reviewed in detail and is in agreement with the HPI and past medical history.  Past medical/surgical history   Past Medical History:  Diagnosis Date  . Diabetes mellitus type I (CMS/HHS-HCC) Jul 2013  . Diabetes mellitus without complication (CMS/HHS-HCC)   . Goiter May 2019   Removed  . Hypercalcemia Dec 2020  . Hyperparathyroidism due to renal insufficiency (CMS/HHS-HCC) Dec 2020  . Hypothyroidism 2013    Past Surgical History:  Procedure Laterality Date  . THYROID  SURGERY  Dec 2020     Medications  Ms. Hollar has a current medication list which includes the following prescription(s): accu-chek guide test strips, amitriptyline , aspirin , dexcom g6 sensor, dexcom g7 sensor, bupropion , calcitriol , calcium  carbonate-vitamin d3, citalopram , famotidine , fluticasone  propionate, hydrochlorothiazide , insulin  lispro, levothyroxine , melatonin, ondansetron , pantoprazole , pnv no.95/ferrous fum/folic ac, potassium, promethazine , calcium  carbonate, hydroxyzine  hcl, probiotic, levocetirizine, loratadine , metoprolol  tartrate, and zinc acetate.   Allergies   Allergies  Allergen Reactions  . Amoxicillin-Pot Clavulanate Vomiting, Diarrhea, Other (See Comments), Nausea And Vomiting and Hives    Did it involve swelling of the face/tongue/throat, SOB, or low BP? No  Did it involve sudden or severe rash/hives, skin peeling, or any reaction on the inside of your mouth or nose? No  Did you need to seek medical attention at a hospital or doctor's office? No  When did it last happen? Few years ago  If all above answers are "NO", may proceed with cephalosporin use.  . Other Diarrhea and Itching    Medical tape causes itching  . Wound Dressings Itching    Medical tape causes itching  . Adhesive Itching    Medical tape causes itching  . Adhesive Tape-Silicones Itching    Medical tape causes itching  . Latex, Natural Rubber Itching and Rash  . Shellfish Containing Products Abdominal Pain, Other (See Comments), Nausea, Diarrhea and Nausea And Vomiting  . Lactose Vomiting  . Tilactase Nausea And Vomiting  . Fish Containing Products Other (See Comments) and Abdominal Pain  . Lactase Nausea  Family history  There is no known family history of congenital heart disease, arrhythmias, sudden cardiac death, or other birth defects.  Social History   Social History   Tobacco Use  Smoking Status Every Day   Smokeless Tobacco Never    Review of Systems  A review of systems was negative except as noted in the HPI or below.   Physical Exam  BP 120/79   Pulse 99   Resp 18   Ht 156 cm (5' 1.42)   Wt 89.2 kg (196 lb 10.4 oz)   LMP 03/15/2022 (Approximate)   SpO2 96%   BMI 36.65 kg/m   Patient is well appearing and in no distress.  She has normal work of breathing. Abdomen is significant for a gravida uterus but is otherwise soft and non-tender. Extremities - no swelling or edema noted. Neuro - grossly intact without focal deficits.  Fetal Echocardiogram  A complete study was performed today, which was technically good.  Please see separate echocardiogram report for full details. There is normal fetal cardiac anatomy and function.  No major heart disease was identified.   Diagnosis     ICD-10-CM   1. Type 1 diabetes mellitus in pregnancy, second trimester (HHS-HCC)  O24.012 PEDS ECHO Fetal Echo        Impressions  I am happy to report that  Cassandra [name]'s fetal heart appears normal.  I have discussed with the patient that glycemic control during pregnancy is important to reduce fetal malformations in the first trimester and to prevent macrosomia and hypertrophic cardiomyopathy during the 3rd trimester. These results were discussed in detail, and all questions were answered.  There is no fetal cardiac indication to alter her prenatal care or delivery plan.  The fetus does not require any further cardiac testing.    The limitations to fetal echocardiography include the presence of small to moderate atrial and ventricular septal defects, mild valve abnormalities, persistence of the ductus arteriosus after birth, postnatal development of coarctation of the aorta, and other cardiac and vascular anomalies too subtle to be imaged prenatally. These limitations were discussed with the patient.  Although no scheduled postnatal cardiac testing is indicated at this time, the baby should have  appropriate cardiac evaluation if there are any clinical concerns after delivery.     Recommendation(s)  No further prenatal cardiac follow-up is required unless any new cardiac concerns are noted.  No fetal cardiac indication to alter newborn care. No postnatal cardiology consultation or echocardiogram is indicated at this time. If there are concerns for heart disease after birth, then pediatric cardiology should be consulted.   It was my pleasure to meet and evaluate Ms. Joslin today. If there are any questions or concerns regarding this evaluation, please do not hesitate to contact me.    I was personally with the patient for 30 minutes. More than 50% of this time was spent doing counseling.  Sincerely,  Darcey Harlem, MD, MPH  Pediatric Cardiology [facility] Phone: [phone], Fax: [phone] On call: [phone] or [phone] [email or web address]  I personally performed the service. (TP)  MCALLISTER CLAUD HARLEM, MD

## 2023-08-24 ENCOUNTER — Ambulatory Visit (INDEPENDENT_AMBULATORY_CARE_PROVIDER_SITE_OTHER): Admitting: Obstetrics and Gynecology

## 2023-08-24 VITALS — BP 119/81 | HR 102 | Wt 197.0 lb

## 2023-08-24 DIAGNOSIS — E1065 Type 1 diabetes mellitus with hyperglycemia: Secondary | ICD-10-CM | POA: Diagnosis not present

## 2023-08-24 DIAGNOSIS — O26899 Other specified pregnancy related conditions, unspecified trimester: Secondary | ICD-10-CM | POA: Diagnosis not present

## 2023-08-24 DIAGNOSIS — Z6791 Unspecified blood type, Rh negative: Secondary | ICD-10-CM

## 2023-08-24 NOTE — Progress Notes (Signed)
 PRENATAL VISIT NOTE  Subjective:  Cassandra [name] is a 23 y.o. G2P0010 at [redacted]w[redacted]d being seen today for ongoing prenatal care.  She is currently monitored for the following issues for this high-risk pregnancy and has Hypertension; Type 1 diabetes, uncontrolled, with neuropathy; Generalized anxiety disorder; Insomnia; Major depressive disorder; MRSA cellulitis; Hashimoto's disease; Symptomatic mammary hypertrophy; Post-surgical hypothyroidism; Post-surgical hypoparathyroidism; Endometriosis; Type 1 diabetes mellitus with hyperglycemia (HCC); Supervision of high risk pregnancy, antepartum; Rh negative state in antepartum period; and Obesity affecting pregnancy, antepartum on their problem list.  Patient reports no complaints.  Contractions: Not present. Vag. Bleeding: None.  Movement: Present. Denies leaking of fluid.   The following portions of the patient's history were reviewed and updated as appropriate: allergies, current medications, past family history, past medical history, past social history, past surgical history and problem list.   Objective:    Vitals:   08/24/23 1117  BP: 119/81  Pulse: (!) 102  Weight: 197 lb (89.4 kg)    Fetal Status:  Fetal Heart Rate (bpm): 147   Movement: Present    General: Alert, oriented and cooperative. Patient is in no acute distress.  Skin: Skin is warm and dry. No rash noted.   Cardiovascular: Normal heart rate noted  Respiratory: Normal respiratory effort, no problems with respiration noted  Abdomen: Soft, gravid, appropriate for gestational age.  Pain/Pressure: Absent     Pelvic: Cervical exam deferred        Extremities: Normal range of motion.  Edema: Trace  Mental Status: Normal mood and affect. Normal behavior. Normal judgment and thought content.   Assessment and Plan:  Pregnancy: G2P0010 at [redacted]w[redacted]d  1. Shortness of breath (Primary)   Improved some.  Was seen in [facility] for symptoms.     2. Type 1 diabetes mellitus with  hyperglycemia (HCC)   -T Slim pump in place along with Dexcom CGM.  -Having some site trouble with T-slim. Waiting for site changes to come in the mail. -Has noticed some leaking of insulin  around the site and has used vials of insulin  to cover some of her elevated BS.    Sugars are improving. Up to 43% in target range on Dexcom (65-140) This is up from 41% last week. Making great progress.   Overall baseline improved <120 Continued meal peaks.    -She requests to continue weekly visits. She is very happy with her progress and would like to continue until things improve even more.    Basal rates below:   12 am-1.65 units per hour. Had a couple of lower readings, will keep rate for now.  4 am-1.35 units per hour, will increase to 1.4 units per hour.   6 am-1.35 units per hour, will increase to 1.4 units per hour.   8 am-1.75 units per hour, will increase to 1.80 units per hour.   12 pm-1.75 units per hour, will increase to 1.80 units per hour.   3 pm- 1.95 units per hour, will increase to 2.0 units per hour.   9 pm- 1.6 units per hour, will increase to 1.7 units per hour.    She is regularly eating a bedtime snack.    Continue Sensitivity/correction of 23- will decrease to 22 Continue Carb ratio:1:4 Continue Target BS 110- Cannot decrease while on Target IQ Fetal Echo completed and normal Growth US  this week.    3. Primary hypertension   BP normal  Continue BASA     4. Rh negative state in antepartum period  Rhogam  at 28w   Preterm labor symptoms and general obstetric precautions including but not limited to vaginal bleeding, contractions, leaking of fluid and fetal movement were reviewed in detail with the patient. Please refer to After Visit Summary for other counseling recommendations.   Preterm labor symptoms and general obstetric precautions including but not limited to vaginal bleeding, contractions, leaking of fluid and fetal movement were reviewed in detail with the  patient. Please refer to After Visit Summary for other counseling recommendations.   No follow-ups on file.  Future Appointments  Date Time Provider Department Center  08/26/2023  8:15 AM [facility] PROVIDER 1 [facility]  08/26/2023  8:30 AM [facility] US4 [facility]  09/01/2023 11:10 AM Erik Kieth BROCKS, MD [facility]  09/07/2023 11:10 AM Kenlea Woodell, Delon FERNS, NP [facility]  09/15/2023  3:30 PM Cleatus Moccasin, MD [facility]  09/30/2023  2:15 PM [facility] PROVIDER 1 [facility]  09/30/2023  3:00 PM [facility] US1 [facility]    Delon Emms, NP

## 2023-08-26 ENCOUNTER — Other Ambulatory Visit: Payer: Self-pay | Admitting: *Deleted

## 2023-08-26 ENCOUNTER — Ambulatory Visit: Attending: Maternal & Fetal Medicine | Admitting: Obstetrics and Gynecology

## 2023-08-26 ENCOUNTER — Ambulatory Visit

## 2023-08-26 ENCOUNTER — Ambulatory Visit (HOSPITAL_BASED_OUTPATIENT_CLINIC_OR_DEPARTMENT_OTHER)

## 2023-08-26 ENCOUNTER — Telehealth (HOSPITAL_COMMUNITY): Payer: Self-pay | Admitting: *Deleted

## 2023-08-26 VITALS — BP 129/71 | HR 92

## 2023-08-26 DIAGNOSIS — E1065 Type 1 diabetes mellitus with hyperglycemia: Secondary | ICD-10-CM

## 2023-08-26 DIAGNOSIS — O289 Unspecified abnormal findings on antenatal screening of mother: Secondary | ICD-10-CM | POA: Insufficient documentation

## 2023-08-26 DIAGNOSIS — I10 Essential (primary) hypertension: Secondary | ICD-10-CM

## 2023-08-26 DIAGNOSIS — Z9641 Presence of insulin pump (external) (internal): Secondary | ICD-10-CM | POA: Diagnosis not present

## 2023-08-26 DIAGNOSIS — Z3A23 23 weeks gestation of pregnancy: Secondary | ICD-10-CM | POA: Diagnosis not present

## 2023-08-26 DIAGNOSIS — E109 Type 1 diabetes mellitus without complications: Secondary | ICD-10-CM

## 2023-08-26 DIAGNOSIS — O360120 Maternal care for anti-D [Rh] antibodies, second trimester, not applicable or unspecified: Secondary | ICD-10-CM

## 2023-08-26 DIAGNOSIS — O10012 Pre-existing essential hypertension complicating pregnancy, second trimester: Secondary | ICD-10-CM | POA: Diagnosis not present

## 2023-08-26 DIAGNOSIS — Z794 Long term (current) use of insulin: Secondary | ICD-10-CM | POA: Insufficient documentation

## 2023-08-26 DIAGNOSIS — O24012 Pre-existing diabetes mellitus, type 1, in pregnancy, second trimester: Secondary | ICD-10-CM

## 2023-08-26 DIAGNOSIS — O99212 Obesity complicating pregnancy, second trimester: Secondary | ICD-10-CM | POA: Diagnosis not present

## 2023-08-26 DIAGNOSIS — Z362 Encounter for other antenatal screening follow-up: Secondary | ICD-10-CM | POA: Insufficient documentation

## 2023-08-26 DIAGNOSIS — E669 Obesity, unspecified: Secondary | ICD-10-CM

## 2023-08-26 NOTE — Progress Notes (Signed)
 After review, MFM consult with provider is not indicated for today  Arna Ranks, MD 08/26/2023 10:15 AM  [facility]

## 2023-08-26 NOTE — Telephone Encounter (Signed)
 [id number] - [place], [place] - [address]  # 90 DAY REQUEST  citalopram  (CELEXA ) 10 MG tablet  LAST FILL 05-03-23

## 2023-09-01 ENCOUNTER — Ambulatory Visit: Admitting: Obstetrics and Gynecology

## 2023-09-01 VITALS — BP 131/82 | HR 106 | Wt 200.0 lb

## 2023-09-01 DIAGNOSIS — F322 Major depressive disorder, single episode, severe without psychotic features: Secondary | ICD-10-CM

## 2023-09-01 DIAGNOSIS — I10 Essential (primary) hypertension: Secondary | ICD-10-CM

## 2023-09-01 DIAGNOSIS — E890 Postprocedural hypothyroidism: Secondary | ICD-10-CM | POA: Diagnosis not present

## 2023-09-01 DIAGNOSIS — O0990 Supervision of high risk pregnancy, unspecified, unspecified trimester: Secondary | ICD-10-CM | POA: Diagnosis not present

## 2023-09-01 DIAGNOSIS — E1065 Type 1 diabetes mellitus with hyperglycemia: Secondary | ICD-10-CM

## 2023-09-01 DIAGNOSIS — Z3A24 24 weeks gestation of pregnancy: Secondary | ICD-10-CM | POA: Diagnosis not present

## 2023-09-01 DIAGNOSIS — E892 Postprocedural hypoparathyroidism: Secondary | ICD-10-CM

## 2023-09-01 DIAGNOSIS — O26899 Other specified pregnancy related conditions, unspecified trimester: Secondary | ICD-10-CM

## 2023-09-01 DIAGNOSIS — Z6791 Unspecified blood type, Rh negative: Secondary | ICD-10-CM

## 2023-09-01 NOTE — Progress Notes (Addendum)
   PRENATAL VISIT NOTE  Subjective:  Cassandra [name] is a 23 y.o. G2P0010 at [redacted]w[redacted]d being seen today for ongoing prenatal care.  She is currently monitored for the following issues for this high-risk pregnancy and has Hypertension; Type 1 diabetes, uncontrolled, with neuropathy; Generalized anxiety disorder; Insomnia; Major depressive disorder; MRSA cellulitis; Hashimoto's disease; Symptomatic mammary hypertrophy; Post-surgical hypothyroidism; Post-surgical hypoparathyroidism; Endometriosis; Type 1 diabetes mellitus with hyperglycemia (HCC); Supervision of high risk pregnancy, antepartum; Rh negative state in antepartum period; and Obesity affecting pregnancy, antepartum on their problem list.  Patient reports doing OK overall. Shortness of breath is stable and she can still do her usual activities. Swelling in her feet, symmetric, lasted 3 days but got pretty bad. Improved now..  Contractions: Not present. Vag. Bleeding: None.  Movement: Present. Denies leaking of fluid.   The following portions of the patient's history were reviewed and updated as appropriate: allergies, current medications, past family history, past medical history, past social history, past surgical history and problem list.   Objective:   Vitals:   09/01/23 1130  BP: 131/82  Pulse: (!) 106  Weight: 200 lb (90.7 kg)    Fetal Status: Fetal Heart Rate (bpm): 141   Movement: Present     General:  Alert, oriented and cooperative. Patient is in no acute distress.  Skin: Skin is warm and dry. No rash noted.   Cardiovascular: Normal heart rate noted  Respiratory: Normal respiratory effort, no problems with respiration noted  Abdomen: Soft, gravid, appropriate for gestational age.  Pain/Pressure: Present      Assessment and Plan:  Pregnancy: G2P0010 at [redacted]w[redacted]d 1. Supervision of high risk pregnancy, antepartum (Primary) 2. [redacted] weeks gestation of pregnancy  3. Type 1 diabetes mellitus with hyperglycemia (HCC) - Dexcom  data reviewed - 40% in range with <2% low/very low - Report she is needing to give frequent corrections and will get locked out of correcting her sugar even if it is still very high - Discussed 10-20% increase of basal rate with goal that she no longer needs to correct her sugar because we are generally within the target range - New settings: 12a 1.65 > 1.7 4a 1.35 (no change) 6a 1.4 > 1.5 8am 1.8 > 2.15  12p 1.8 > 2.15 3p 2 > 2.4 9p 1.7 > 2 ISF 1:22 (no change) ICR 1:4 (no change) - Normal fetal echo, growth US  7/24 AGA with normal fluid - Next growth scheduled 8/28 - Pt would like to continue weekly visits - Next endo appt 8/14  4. Post-surgical hypothyroidism TSH 0.2 08/17/23 Continue synthroid  200mcg daily, will re-eval with endo 8/14  5. Post-surgical hypoparathyroidism Calcium  8.7 7/15, stable Continue calcitriol  0,5mg  daily F/w endo 8/14  6. Rh negative state in antepartum period Rhogam at 28 weeks  7. Current severe episode of major depressive disorder without psychotic features, unspecified whether recurrent (HCC) Continue celexa   8. Primary hypertension Normotensive No meds ldASA Antenatal testing as above  Please refer to After Visit Summary for other counseling recommendations.   Future Appointments  Date Time Provider Department Center  09/07/2023 11:10 AM Rasch, Delon FERNS, NP [facility]  09/15/2023  3:30 PM Cleatus Moccasin, MD [facility]  09/30/2023  2:45 PM [facility] PROVIDER 1 [facility]  09/30/2023  3:00 PM [facility] US1 [facility]  10/28/2023  3:30 PM [facility] PROVIDER 1 [facility]  10/28/2023  3:45 PM [facility] US3 [facility]    Kieth JAYSON Carolin, MD

## 2023-09-07 ENCOUNTER — Ambulatory Visit (INDEPENDENT_AMBULATORY_CARE_PROVIDER_SITE_OTHER): Admitting: Obstetrics and Gynecology

## 2023-09-07 VITALS — BP 123/75 | HR 88 | Wt 205.0 lb

## 2023-09-07 DIAGNOSIS — Z3A25 25 weeks gestation of pregnancy: Secondary | ICD-10-CM

## 2023-09-07 DIAGNOSIS — E1065 Type 1 diabetes mellitus with hyperglycemia: Secondary | ICD-10-CM

## 2023-09-07 MED ORDER — METFORMIN HCL 500 MG PO TABS: 500.0000 mg | ORAL_TABLET | Freq: Two times a day (BID) | ORAL | 1 refills | Status: DC

## 2023-09-07 NOTE — Progress Notes (Signed)
 PRENATAL VISIT NOTE  Subjective:  Cassandra [name] is a 23 y.o. G2P0010 at [redacted]w[redacted]d being seen today for ongoing prenatal care.  She is currently monitored for the following issues for this high-risk pregnancy and has Hypertension; Type 1 diabetes, uncontrolled, with neuropathy; Generalized anxiety disorder; Insomnia; Major depressive disorder; MRSA cellulitis; Hashimoto's disease; Symptomatic mammary hypertrophy; Post-surgical hypothyroidism; Post-surgical hypoparathyroidism; Endometriosis; Type 1 diabetes mellitus with hyperglycemia (HCC); Supervision of high risk pregnancy, antepartum; Rh negative state in antepartum period; and Obesity affecting pregnancy, antepartum on their problem list.  Patient reports concerns about blood glucose management.  Contractions: Not present. Vag. Bleeding: None.  Movement: Present. Denies leaking of fluid.   The following portions of the patient's history were reviewed and updated as appropriate: allergies, current medications, past family history, past medical history, past social history, past surgical history and problem list.   Objective:    Vitals:   09/07/23 1108  BP: 123/75  Pulse: 88  Weight: 205 lb (93 kg)    Fetal Status:  Fetal Heart Rate (bpm): 147   Movement: Present    General: Alert, oriented and cooperative. Patient is in no acute distress.  Skin: Skin is warm and dry. No rash noted.   Cardiovascular: Normal heart rate noted  Respiratory: Normal respiratory effort, no problems with respiration noted  Abdomen: Soft, gravid, appropriate for gestational age.  Pain/Pressure: Absent     Pelvic: Cervical exam deferred        Extremities: Normal range of motion.  Edema: Trace  Mental Status: Normal mood and affect. Normal behavior. Normal judgment and thought content.   Assessment and Plan:  Pregnancy: G2P0010 at [redacted]w[redacted]d  1. [redacted] weeks gestation of pregnancy BP and FHR normal today. Taking BASA Continuing close follow up with Endo  for thyroid  disorder.   Discussed altering morning breakfast. Routinely eats cereal. Recommend lower carb breakfast to help with meal/daytime spikes.    2. Type 1 diabetes mellitus with hyperglycemia (HCC) (Primary)    Follow Growth U/S 09/30/23.  -Dexcom data reviewed - 35% in range with <1% low/very low -Report she is needing to give frequent corrections  -Discussed 10-20% increase of basal rate with goal that she no longer needs to correct her sugar because we are generally within the target range. -She has been self increasing her basal as directed.   - New settings:  12a 2.0 units per hour, increase to 3.0 units per hour.  3a 1.35 (no change)- lows around 0300 persistently  6a 1.8, increase to 1.9 units per hour. 8am 2.55, increase to 2.65 units per hour.  3p 2.8, increase to 2.9 units per hour.  9p 2.4, increase to 2.5 units per hour.   ISF 1:20 self changed from 1:22  Correction factor 8am-12am 1:20 Correction factor 12am -8 am- 1:22- due to lows around 0600 due to auto correcting on IQ mode.   ICR 1:4, change to 1:3 during meal times.  - Normal fetal echo, growth US  7/24 AGA with normal fluid - Next growth scheduled 8/28 - Pt would like to continue weekly visits - Next endo appt 8/14  Start Metformin  500 mg PO BID / take doses 12 hours apart. Will increase to 1000 mg BID if she tolerates initiation.     There are no diagnoses linked to this encounter. Preterm labor symptoms and general obstetric precautions including but not limited to vaginal bleeding, contractions, leaking of fluid and fetal movement were reviewed in detail with the patient. Please refer to After  Visit Summary for other counseling recommendations.   No follow-ups on file.  Future Appointments  Date Time Provider Department Center  09/15/2023  3:30 PM Cleatus Moccasin, MD [facility]  09/30/2023  2:45 PM [facility] PROVIDER 1 [facility]  09/30/2023  3:00 PM [facility] US1 [facility]   10/28/2023  3:30 PM [facility] PROVIDER 1 [facility]  10/28/2023  3:45 PM [facility] US3 [facility]    Derrek JINNY Freund, NP Student  Dorita Delon FERNS, NP 09/07/2023 1:32 PM

## 2023-09-07 NOTE — Progress Notes (Signed)
 Pt presents for ROB visit. No concerns

## 2023-09-12 ENCOUNTER — Other Ambulatory Visit: Payer: Self-pay | Admitting: Obstetrics and Gynecology

## 2023-09-12 DIAGNOSIS — K219 Gastro-esophageal reflux disease without esophagitis: Secondary | ICD-10-CM

## 2023-09-13 NOTE — Telephone Encounter (Signed)
 Pt came by office to pick up new Dex Com 7

## 2023-09-15 ENCOUNTER — Ambulatory Visit (INDEPENDENT_AMBULATORY_CARE_PROVIDER_SITE_OTHER): Admitting: Obstetrics and Gynecology

## 2023-09-15 VITALS — BP 134/85 | HR 95 | Wt 205.0 lb

## 2023-09-15 DIAGNOSIS — F322 Major depressive disorder, single episode, severe without psychotic features: Secondary | ICD-10-CM

## 2023-09-15 DIAGNOSIS — O99210 Obesity complicating pregnancy, unspecified trimester: Secondary | ICD-10-CM

## 2023-09-15 DIAGNOSIS — O0990 Supervision of high risk pregnancy, unspecified, unspecified trimester: Secondary | ICD-10-CM

## 2023-09-15 DIAGNOSIS — E890 Postprocedural hypothyroidism: Secondary | ICD-10-CM | POA: Diagnosis not present

## 2023-09-15 DIAGNOSIS — O26899 Other specified pregnancy related conditions, unspecified trimester: Secondary | ICD-10-CM

## 2023-09-15 DIAGNOSIS — I10 Essential (primary) hypertension: Secondary | ICD-10-CM

## 2023-09-15 DIAGNOSIS — Z6791 Unspecified blood type, Rh negative: Secondary | ICD-10-CM

## 2023-09-15 DIAGNOSIS — M5432 Sciatica, left side: Secondary | ICD-10-CM

## 2023-09-15 DIAGNOSIS — Z3A26 26 weeks gestation of pregnancy: Secondary | ICD-10-CM

## 2023-09-15 DIAGNOSIS — E892 Postprocedural hypoparathyroidism: Secondary | ICD-10-CM | POA: Diagnosis not present

## 2023-09-15 DIAGNOSIS — E1065 Type 1 diabetes mellitus with hyperglycemia: Secondary | ICD-10-CM

## 2023-09-15 NOTE — Patient Instructions (Signed)
 [facility]

## 2023-09-15 NOTE — Progress Notes (Signed)
 PRENATAL VISIT NOTE  Subjective:  Cassandra [name] is a 23 y.o. G2P0010 at [redacted]w[redacted]d being seen today for ongoing prenatal care.  She is currently monitored for the following issues for this high-risk pregnancy and has Hypertension; Type 1 diabetes, uncontrolled, with neuropathy; Generalized anxiety disorder; Insomnia; Major depressive disorder; MRSA cellulitis; Hashimoto's disease; Symptomatic mammary hypertrophy; Post-surgical hypothyroidism; Post-surgical hypoparathyroidism; Endometriosis; Type 1 diabetes mellitus with hyperglycemia (HCC); Supervision of high risk pregnancy, antepartum; Rh negative state in antepartum period; and Obesity affecting pregnancy, antepartum on their problem list.  Patient reports sciatica.  Contractions: Not present. Vag. Bleeding: None.  Movement: Present. Denies leaking of fluid.   The following portions of the patient's history were reviewed and updated as appropriate: allergies, current medications, past family history, past medical history, past social history, past surgical history and problem list.   Objective:    Vitals:   09/15/23 1538  BP: 134/85  Pulse: 95  Weight: 205 lb (93 kg)    Fetal Status:      Movement: Present    General: Alert, oriented and cooperative. Patient is in no acute distress.  Skin: Skin is warm and dry. No rash noted.   Cardiovascular: Normal heart rate noted  Respiratory: Normal respiratory effort, no problems with respiration noted  Abdomen: Soft, gravid, appropriate for gestational age.  Pain/Pressure: Absent     Pelvic: Cervical exam deferred        Extremities: Normal range of motion.  Edema: Trace  Mental Status: Normal mood and affect. Normal behavior. Normal judgment and thought content.   Assessment and Plan:  Pregnancy: G2P0010 at [redacted]w[redacted]d 1. Primary hypertension (Primary) BP wnl  2. Type 1 diabetes mellitus with hyperglycemia (HCC) -Dexcom data reviewed - 36% in range with <1% low/very low -Discussed  10-20% increase of basal rate with goal that she no longer needs to correct her sugar because we are generally within the target range. -She has been self increasing her basal as directed.    - New settings: 12a 2.95 units per hour, Leave at this 3a 1.35 (no change)- lows around 0300 persistently  6a 1.9, increase to 2.1 units per hour. 8am 2.65  3p 2.9 9p 2.6, increase to 2.8 units per hour.    ICF 1:20    Correction factor  6am: 1:22 >> increase to 1:20 8am 1:20 12am 1:23 3am- 1:25    ICR  1:3 from 3pm-9pm 1:4 rest of the time  - Normal fetal echo, growth US  7/24 AGA (34%ile) with normal fluid - Next growth scheduled 8/28 - Next endo appt 8/14  3. Post-surgical hypothyroidism Follows with endocrine. Next appt 8/14  4. Post-surgical hypoparathyroidism See above.   5. Supervision of high risk pregnancy, antepartum MOC: Likely condoms  6. Rh negative state in antepartum period Rhogam at 28w   7. Obesity affecting pregnancy, antepartum, unspecified obesity type  8. Current severe episode of major depressive disorder without psychotic features, unspecified whether recurrent (HCC) Mod stable on medications.   9. Pregnancy with 26 completed weeks gestation   Preterm labor symptoms and general obstetric precautions including but not limited to vaginal bleeding, contractions, leaking of fluid and fetal movement were reviewed in detail with the patient. Please refer to After Visit Summary for other counseling recommendations.   No follow-ups on file.  Future Appointments  Date Time Provider Department Center  09/30/2023  2:45 PM [facility] PROVIDER 1 [facility]  09/30/2023  3:00 PM [facility] US1 [facility]  10/28/2023  3:30 PM [facility]  PROVIDER 1 [facility]  10/28/2023  3:45 PM [facility] US3 [facility]    Vina Solian, MD

## 2023-09-16 ENCOUNTER — Ambulatory Visit: Payer: Self-pay | Admitting: Obstetrics and Gynecology

## 2023-09-16 LAB — CBC
Hematocrit: 35.7 % (ref 34.0–46.6)
Hemoglobin: 11.5 g/dL (ref 11.1–15.9)
MCH: 27.4 pg (ref 26.6–33.0)
MCHC: 32.2 g/dL (ref 31.5–35.7)
MCV: 85 fL (ref 79–97)
Platelets: 255 x10E3/uL (ref 150–450)
RBC: 4.2 x10E6/uL (ref 3.77–5.28)
RDW: 13.7 % (ref 11.7–15.4)
WBC: 10.8 x10E3/uL (ref 3.4–10.8)

## 2023-09-16 LAB — HEMOGLOBIN A1C
Est. average glucose Bld gHb Est-mCnc: 146 mg/dL
Hgb A1c MFr Bld: 6.7 % — ABNORMAL HIGH (ref 4.8–5.6)

## 2023-09-16 LAB — HIV ANTIBODY (ROUTINE TESTING W REFLEX): HIV Screen 4th Generation wRfx: NONREACTIVE

## 2023-09-16 LAB — RPR: RPR Ser Ql: NONREACTIVE

## 2023-09-20 ENCOUNTER — Inpatient Hospital Stay (HOSPITAL_COMMUNITY)
Admission: AD | Admit: 2023-09-20 | Discharge: 2023-09-20 | Disposition: A | Discharge status: Home / Self Care | Attending: Obstetrics & Gynecology | Admitting: Obstetrics & Gynecology

## 2023-09-20 ENCOUNTER — Ambulatory Visit: Admitting: Physical Therapy

## 2023-09-20 ENCOUNTER — Telehealth: Payer: Self-pay

## 2023-09-20 DIAGNOSIS — E10649 Type 1 diabetes mellitus with hypoglycemia without coma: Secondary | ICD-10-CM | POA: Insufficient documentation

## 2023-09-20 DIAGNOSIS — Z3A27 27 weeks gestation of pregnancy: Secondary | ICD-10-CM | POA: Insufficient documentation

## 2023-09-20 DIAGNOSIS — Z9641 Presence of insulin pump (external) (internal): Secondary | ICD-10-CM | POA: Insufficient documentation

## 2023-09-20 DIAGNOSIS — E162 Hypoglycemia, unspecified: Secondary | ICD-10-CM | POA: Diagnosis present

## 2023-09-20 DIAGNOSIS — O24012 Pre-existing diabetes mellitus, type 1, in pregnancy, second trimester: Secondary | ICD-10-CM | POA: Insufficient documentation

## 2023-09-20 DIAGNOSIS — O368120 Decreased fetal movements, second trimester, not applicable or unspecified: Secondary | ICD-10-CM | POA: Diagnosis not present

## 2023-09-20 DIAGNOSIS — E1065 Type 1 diabetes mellitus with hyperglycemia: Secondary | ICD-10-CM

## 2023-09-20 LAB — CBC
HCT: 32.5 % — ABNORMAL LOW (ref 36.0–46.0)
Hemoglobin: 10.9 g/dL — ABNORMAL LOW (ref 12.0–15.0)
MCH: 27.7 pg (ref 26.0–34.0)
MCHC: 33.5 g/dL (ref 30.0–36.0)
MCV: 82.7 fL (ref 80.0–100.0)
Platelets: 248 K/uL (ref 150–400)
RBC: 3.93 MIL/uL (ref 3.87–5.11)
RDW: 13.4 % (ref 11.5–15.5)
WBC: 10.8 K/uL — ABNORMAL HIGH (ref 4.0–10.5)
nRBC: 0 % (ref 0.0–0.2)

## 2023-09-20 LAB — COMPREHENSIVE METABOLIC PANEL WITH GFR
ALT: 25 U/L (ref 0–44)
AST: 20 U/L (ref 15–41)
Albumin: 2.6 g/dL — ABNORMAL LOW (ref 3.5–5.0)
Alkaline Phosphatase: 74 U/L (ref 38–126)
Anion gap: 13 (ref 5–15)
BUN: 5 mg/dL — ABNORMAL LOW (ref 6–20)
CO2: 20 mmol/L — ABNORMAL LOW (ref 22–32)
Calcium: 8.1 mg/dL — ABNORMAL LOW (ref 8.9–10.3)
Chloride: 104 mmol/L (ref 98–111)
Creatinine, Ser: 0.72 mg/dL (ref 0.44–1.00)
GFR, Estimated: >60 mL/min (ref >60)
Glucose, Bld: 95 mg/dL (ref 70–99)
Potassium: 3.3 mmol/L — ABNORMAL LOW (ref 3.5–5.1)
Sodium: 137 mmol/L (ref 135–145)
Total Bilirubin: 0.3 mg/dL (ref 0.0–1.2)
Total Protein: 6.7 g/dL (ref 6.5–8.1)

## 2023-09-20 LAB — GLUCOSE, CAPILLARY
Glucose-Capillary: 144 mg/dL — ABNORMAL HIGH (ref 70–99)
Glucose-Capillary: 33 mg/dL — CL (ref 70–99)
Glucose-Capillary: 56 mg/dL — ABNORMAL LOW (ref 70–99)
Glucose-Capillary: 53 mg/dL — ABNORMAL LOW (ref 70–99)
Glucose-Capillary: 66 mg/dL — ABNORMAL LOW (ref 70–99)
Glucose-Capillary: 146 mg/dL — ABNORMAL HIGH (ref 70–99)

## 2023-09-20 LAB — PROTEIN / CREATININE RATIO, URINE
Creatinine, Urine: 127 mg/dL
Protein Creatinine Ratio: 0.08 mg/mg{creat} (ref 0.00–0.15)
Total Protein, Urine: 10 mg/dL

## 2023-09-20 MED ORDER — POTASSIUM CHLORIDE CRYS ER 20 MEQ PO TBCR
40.0000 meq | EXTENDED_RELEASE_TABLET | Freq: Once | ORAL | Status: AC
  Administered 2023-09-20: 40 meq via ORAL
  Filled 2023-09-20: qty 2

## 2023-09-20 MED ORDER — DEXTROSE 50 % IV SOLN
INTRAVENOUS | Status: AC
  Administered 2023-09-20: 25 mL
  Filled 2023-09-20: qty 50

## 2023-09-20 NOTE — Telephone Encounter (Signed)
 RN received mychart message reading decreased fetal movement and frequent bowel movements. RN attempted to call patient to follow up, left HIPAA compliant voicemail to return RN call.  Silvano LELON Piano, RN

## 2023-09-20 NOTE — Inpatient Diabetes Management (Signed)
 Inpatient Diabetes Program Recommendations  AACE/ADA: New Consensus Statement on Inpatient Glycemic Control (2015)  Target Ranges:  Prepandial:   less than 140 mg/dL      Peak postprandial:   less than 180 mg/dL (1-2 hours)      Critically ill patients:  140 - 180 mg/dL   Lab Results  Component Value Date   GLUCAP 53 (L) 09/20/2023   HGBA1C 6.7 (H) 09/15/2023    Review of Glycemic Control  Latest Reference Range & Units 09/20/23 13:36 09/20/23 13:46 09/20/23 14:02 09/20/23 15:00  Glucose-Capillary 70 - 99 mg/dL 33 (LL) 56 (L) 855 (H) 53 (L)  (LL): Data is critically low (L): Data is abnormally low (H): Data is abnormally high   Diabetes history: T1DM Outpatient Diabetes medications: Tandem T-Slim Insulin  Pump with the Dexcom G7 Basal                        ISF              ICR MN-2.95/hr            1:23               1:3 3a-1.35/hr              1:25               1:3 6a-2.1/hr                1:20               1:3 8a-2.65/hr              1:20               1:3 3p-2.9/hr                1:20               1:3 9p-2.8/hr                1:20               1:3  Target-110 mg/dL   Patient presented with decreased FM.  Has insulin  pump on and Dexcom G7.  Current with Dr. Beryl.  Last visit was 8/14.  She states he decreased her pump settings at that visit.  She also sees her OB and Delon Emms, NP for pump adjustments as well.  Patient tells me she has been having at least 2 episodes of hypoglycemia daily.  Reached out to [employer] by secure chat asking for her to adjust her pump further.  She states she can come see her today.    Thank you, Wyvonna Pinal, MSN, CDCES Diabetes Coordinator Inpatient Diabetes Program [phone] (team pager from 8a-5p)

## 2023-09-20 NOTE — MAU Note (Signed)
 Called to lobby by registration that pt states her blood sugar is 43, pt brought to room 130 per wheelchair. Alert and oriented, sightly diaphoretic. Cbg upon arrival to room 33, provider notified and extra hands called to bedside. Pt given 4 ounces of apple juice while iv access obtained. Continued alert and oriented. Provider at bedside. [name]

## 2023-09-20 NOTE — MAU Provider Note (Signed)
 History    HPI:  Patient is a 23y/o G2P0010 at [redacted]w[redacted]d who presents to MAU for DFM and hypoglycemia. She has a hx of Type I diabetes and is on an insulin  pump and Dex com. She states that when she woke up her BGL was around 220. She ate fruit loops for breakfast and then administered a 22.79 unit bolus after that. She states that she sees endocrinology once a month who adjusted her insulin  last month and then saw OB who also adjusted insulin . She sees OB/GYN weekly.   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/25/2019   S/P thyroidectomy 01/27/2019   Symptomatic mammary hypertrophy 03/17/2017   Thyroid  goiter 05/09/2018   Type 1 diabetes, uncontrolled, 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    Past Surgical History:  Procedure Laterality Date   ADENOIDECTOMY     CHOLECYSTECTOMY     COLONOSCOPY     ELPP Surgery     ESOPHAGOGASTRODUODENOSCOPY     SPHINCTEROTOMY  03/16/2023   THYROIDECTOMY N/A 01/25/2019   Procedure:  COMPLETE THYROIDECTOMY;  Surgeon: Karis Clunes, MD;  Location: [facility] OR;  Service: ENT;  Laterality: N/A;   TOE SURGERY Bilateral    for ingrown toenails   TYMPANOSTOMY TUBE PLACEMENT     WISDOM TOOTH EXTRACTION      Family History  Problem Relation Age of Onset   Hypertension Maternal Grandmother    Anxiety disorder Maternal Grandmother    ADD / ADHD Maternal Grandmother    Diabetes Maternal Grandmother    Hyperlipidemia Maternal Grandfather    Hypothyroidism Maternal Grandfather    Cancer Maternal Grandfather        blood cancer (unknown name)   Hyperlipidemia Mother    Anxiety disorder Mother    Hyperlipidemia Other    Migraines Neg Hx    Seizures Neg Hx    Depression Neg Hx    Autism Neg Hx    Bipolar disorder Neg Hx    Schizophrenia Neg Hx     Social History   Tobacco Use   Smoking status: Former    Current packs/day: 0.00    Average packs/day: 0.1 packs/day for 5.0 years (0.7 ttl pk-yrs)    Types: Cigarettes    Start date: 01/22/2014    Quit date: 01/23/2019    Years since quitting: 4.6    Passive exposure: Current (mom smokes)   Smokeless tobacco: Never  Vaping Use   Vaping status: Former  Substance Use Topics   Alcohol use: No   Drug use: No    Allergies:  Allergies  Allergen Reactions   Shellfish-Derived Products Diarrhea and Nausea And Vomiting   Tape Itching    Medical tape causes itching   Amoxicillin-Pot Clavulanate Nausea And Vomiting and Other (See Comments)   Lactose Intolerance (Gi)    Shellfish Allergy Nausea And Vomiting and Other (See Comments)   Tilactase Nausea And Vomiting   Latex Rash    Medications Prior to Admission  Medication Sig Dispense Refill Last Dose/Taking   amitriptyline  (ELAVIL ) 75 MG tablet Take 1 tablet (75 mg total) by mouth at bedtime. 90 tablet 1    aspirin  EC 81 MG tablet Take 1 tablet (81 mg total) by mouth daily. Swallow whole. 30 tablet 12    Blood Glucose Monitoring Suppl (ACCU-CHEK GUIDE) w/Device KIT 1 kit by  Does not apply route daily as needed. 2 kit 5    calcitRIOL  (ROCALTROL ) 0.5 MCG capsule Take 0.5 mcg by mouth daily.      calcium  carbonate (OSCAL) 1500 (600 Ca) MG TABS tablet Take 1,500 mg by mouth in the morning, at noon, and at bedtime.      citalopram  (CELEXA ) 10 MG tablet Take 10 mg by mouth daily.      Continuous Blood Gluc Receiver (DEXCOM G6 RECEIVER) DEVI 1 Device by Does not apply route daily as needed. 1 each 0    Continuous Blood Gluc Sensor (DEXCOM G6 SENSOR) MISC CHANGE SENSOR EVERY 10 DAYS AS DIRECTED. 3 each 5    Continuous Blood Gluc Transmit (DEXCOM G6 TRANSMITTER) MISC 1 kit by Does not apply route daily as needed. 1 each 1    famotidine  (PEPCID ) 20 MG tablet Take 1 tablet (20 mg total) by mouth 2 (two) times daily. 60 tablet 3    fluticasone  (FLONASE ) 50 MCG/ACT nasal spray Place 2 sprays into both nostrils daily. 1 g 3    Glucagon  (BAQSIMI  TWO PACK) 3 MG/DOSE POWD Place 1 each into the nose as needed (severe hypoglycmia with unresponsiveness). 1 each 3    insulin  degludec (TRESIBA ) 200 UNIT/ML FlexTouch Pen Inject into the skin.      insulin  lispro (HUMALOG ) 100 UNIT/ML injection INJECT UP TO 300 UNITS IN INSULIN  PUMP EVERY 48 HOURS, PER DKA AND HYPERGLYCEMIA PROTOCOLS 40 mL 5    Insulin  Syringe-Needle U-100 (INSULIN  SYRINGE .3CC/29GX1/2) 29G X 1/2 0.3 ML MISC 1 each by Does not apply route 6 (six) times daily. (Patient not taking: Reported on 09/15/2023) 200 each 6    levothyroxine  (SYNTHROID ) 200 MCG tablet Take 200 mcg by mouth daily.      Melatonin 1 MG CAPS Take by mouth.      nystatin  powder Apply 1 Application topically 3 (three) times daily. 15 g 2    Ostomy Supplies (ADHESIVE REMOVER WIPES) MISC Use as directed for pump and CGM sites. 50 each 3    Prenatal Vit-Fe Fumarate-FA (MULTIVITAMIN-PRENATAL) 27-0.8 MG TABS tablet Take 1 tablet by mouth daily at 12 noon.       Review of Systems Physical Exam   Last menstrual period 03/15/2023.  Physical Exam  MAU  Course  Procedures  MDM Type I diabetic who presents for hypoglycemia and DFM. Glucoses improved with 1/2 amp of D50% and pushing PO intake. Glucose stabilized and insulin  pump hooked back up. Diabetic educator saw the patient while here.   Assessment and Plan   -Glucose upon arrival was 33. Initially fixed with 1/2 amp of  D50% and orange juice. Glucose returned to normal range. Patient had one more episode of hypoglycemia fixed with oral intake after reattachment of insulin  pump. Dexcom was reading ~30 points higher than POC glucose checks. After recalibrating Dexcom, CGM was reading about 12 points higher than POC glucose checks.   Current pump settings:  Basal                        ISF              ICR MN-2.95/hr            1:23               1:3 3a-1.35/hr              1:25               1:3 6a-2.1/hr                1:20               1:3 8a-2.65/hr              1:20               1:3 3p-2.9/hr                1:20               1:3 9p-2.8/hr                1:20               1:3  #Type I diabetes: Discussed patient with Dr. Jayne. ICR likely a little high, would benefit from ~1.5 ICR due to hypoglycemic episode that occurred after fast acting bolus after a meal with adjustment in basal insulin .  -Reached out to Delon Emms, NP who is going to adjust insulin  pump settings -Discussed optimal diabetic diet with patient and patient's support person.  #DFM: NST reactive 10x10 #Hypokalemia: Repleted -Patient has appointment with Dr. Erik at 8:00 on Wednesday.  -All questions answered, anticipatory guidance and detailed return precautions provided.     Thaddeus Evitts L Daveah Varone 09/20/2023, 4:29 PM

## 2023-09-22 ENCOUNTER — Ambulatory Visit (INDEPENDENT_AMBULATORY_CARE_PROVIDER_SITE_OTHER): Admitting: Obstetrics and Gynecology

## 2023-09-22 VITALS — BP 120/81 | HR 108 | Wt 208.0 lb

## 2023-09-22 DIAGNOSIS — Z3A27 27 weeks gestation of pregnancy: Secondary | ICD-10-CM | POA: Diagnosis not present

## 2023-09-22 DIAGNOSIS — O10012 Pre-existing essential hypertension complicating pregnancy, second trimester: Secondary | ICD-10-CM | POA: Diagnosis not present

## 2023-09-22 DIAGNOSIS — Z23 Encounter for immunization: Secondary | ICD-10-CM

## 2023-09-22 DIAGNOSIS — I10 Essential (primary) hypertension: Secondary | ICD-10-CM

## 2023-09-22 DIAGNOSIS — F411 Generalized anxiety disorder: Secondary | ICD-10-CM

## 2023-09-22 DIAGNOSIS — O24012 Pre-existing diabetes mellitus, type 1, in pregnancy, second trimester: Secondary | ICD-10-CM | POA: Diagnosis not present

## 2023-09-22 DIAGNOSIS — O26892 Other specified pregnancy related conditions, second trimester: Secondary | ICD-10-CM

## 2023-09-22 DIAGNOSIS — O0990 Supervision of high risk pregnancy, unspecified, unspecified trimester: Secondary | ICD-10-CM

## 2023-09-22 DIAGNOSIS — O0992 Supervision of high risk pregnancy, unspecified, second trimester: Secondary | ICD-10-CM

## 2023-09-22 DIAGNOSIS — E892 Postprocedural hypoparathyroidism: Secondary | ICD-10-CM

## 2023-09-22 DIAGNOSIS — Z6791 Unspecified blood type, Rh negative: Secondary | ICD-10-CM

## 2023-09-22 DIAGNOSIS — O99282 Endocrine, nutritional and metabolic diseases complicating pregnancy, second trimester: Secondary | ICD-10-CM

## 2023-09-22 DIAGNOSIS — E890 Postprocedural hypothyroidism: Secondary | ICD-10-CM

## 2023-09-22 DIAGNOSIS — F322 Major depressive disorder, single episode, severe without psychotic features: Secondary | ICD-10-CM

## 2023-09-22 DIAGNOSIS — E1065 Type 1 diabetes mellitus with hyperglycemia: Secondary | ICD-10-CM

## 2023-09-22 NOTE — Progress Notes (Signed)
   PRENATAL VISIT NOTE  Subjective:  Cassandra [name] is a 23 y.o. G2P0010 at [redacted]w[redacted]d being seen today for ongoing prenatal care.  She is currently monitored for the following issues for this high-risk pregnancy and has Hypertension; Type 1 diabetes, uncontrolled, with neuropathy; Generalized anxiety disorder; Insomnia; Major depressive disorder; MRSA cellulitis; Hashimoto's disease; Symptomatic mammary hypertrophy; Post-surgical hypothyroidism; Post-surgical hypoparathyroidism; Endometriosis; Type 1 diabetes mellitus with hyperglycemia (HCC); Supervision of high risk pregnancy, antepartum; Rh negative state in antepartum period; and Obesity affecting pregnancy, antepartum on their problem list.  Patient reports doing OK overall.  Contractions: Not present. Vag. Bleeding: None.  Movement: Present. Denies leaking of fluid.   The following portions of the patient's history were reviewed and updated as appropriate: allergies, current medications, past family history, past medical history, past social history, past surgical history and problem list.   Objective:   Vitals:   09/22/23 0829 09/22/23 0832  BP: (!) 141/83 120/81  Pulse: (!) 114 (!) 108  Weight: 208 lb (94.3 kg)     Fetal Status: Fetal Heart Rate (bpm): 139   Movement: Present     General:  Alert, oriented and cooperative. Patient is in no acute distress.  Skin: Skin is warm and dry. No rash noted.   Cardiovascular: Normal heart rate noted  Respiratory: Normal respiratory effort, no problems with respiration noted  Abdomen: Soft, gravid, appropriate for gestational age.  Pain/Pressure: Absent      Assessment and Plan:  Pregnancy: G2P0010 at [redacted]w[redacted]d 1. [redacted] weeks gestation of pregnancy (Primary) 2. Supervision of high risk pregnancy, antepartum Normal CBC, HIV/RPR NR last visit Tdap today Planning for circ  3. Type 1 diabetes mellitus with hyperglycemia (HCC) - Uncontrolled BG. Settings just adjusted 6 days ago by  endocrinologist and yesterday by Delon Emms. Will not make any additional adjustments today - Normal fetal echo, normal growth. Next US  scheduled 8/28 - Weekly BPP at 32 weeks - Delivery by 37 weeks  4. Primary hypertension Initial BP mild range but repeat normal ldASA No meds  5. Rh negative state in antepartum period Rhogam reviewed, plan to administer next visit (28wk)  6. Post-surgical hypoparathyroidism F/w endo, last seen 8/14 Calcium  level q2wk Continue calcitriol  & calcium  per endo  7. Post-surgical hypothyroidism Synthroid  , f/w endo as above  8. Current severe episode of major depressive disorder without psychotic features, unspecified whether recurrent (HCC) 9. Generalized anxiety disorder No mood complaints today Continue celexa , elavil   Return in about 1 week (around 09/29/2023) for return OB at 28 weeks with rhogam.  Future Appointments  Date Time Provider Department Center  09/28/2023 11:10 AM Rasch, Delon FERNS, NP [facility]  09/30/2023  2:45 PM [facility] PROVIDER 1 [facility]  09/30/2023  3:00 PM [facility] US1 [facility]  10/05/2023  3:30 PM Rasch, Delon FERNS, NP [facility]  10/12/2023 11:10 AM Rasch, Delon FERNS, NP [facility]  10/19/2023 11:10 AM Rasch, Delon FERNS, NP [facility]  10/26/2023 11:10 AM Rasch, Delon FERNS, NP [facility]  10/28/2023  3:30 PM [facility] PROVIDER 1 [facility]  10/28/2023  3:45 PM [facility] US3 [facility]  11/02/2023 11:10 AM Rasch, Delon FERNS, NP [facility]  11/05/2023  8:00 AM Elnor Channing FALCON, PT [facility] None   Kieth JAYSON Carolin, MD

## 2023-09-23 ENCOUNTER — Other Ambulatory Visit

## 2023-09-23 ENCOUNTER — Ambulatory Visit

## 2023-09-28 ENCOUNTER — Encounter: Admitting: Obstetrics and Gynecology

## 2023-09-28 ENCOUNTER — Telehealth: Payer: Self-pay | Admitting: *Deleted

## 2023-09-28 NOTE — Telephone Encounter (Signed)
 Left patient a message that she can come in earlier if she is able.

## 2023-09-30 ENCOUNTER — Ambulatory Visit: Attending: Obstetrics and Gynecology | Admitting: Maternal & Fetal Medicine

## 2023-09-30 ENCOUNTER — Ambulatory Visit

## 2023-09-30 VITALS — BP 122/81 | HR 86

## 2023-09-30 DIAGNOSIS — Z3689 Encounter for other specified antenatal screening: Secondary | ICD-10-CM | POA: Insufficient documentation

## 2023-09-30 DIAGNOSIS — E109 Type 1 diabetes mellitus without complications: Secondary | ICD-10-CM

## 2023-09-30 DIAGNOSIS — E890 Postprocedural hypothyroidism: Secondary | ICD-10-CM | POA: Diagnosis not present

## 2023-09-30 DIAGNOSIS — Z362 Encounter for other antenatal screening follow-up: Secondary | ICD-10-CM | POA: Insufficient documentation

## 2023-09-30 DIAGNOSIS — O289 Unspecified abnormal findings on antenatal screening of mother: Secondary | ICD-10-CM | POA: Insufficient documentation

## 2023-09-30 DIAGNOSIS — O99213 Obesity complicating pregnancy, third trimester: Secondary | ICD-10-CM | POA: Diagnosis not present

## 2023-09-30 DIAGNOSIS — E1065 Type 1 diabetes mellitus with hyperglycemia: Secondary | ICD-10-CM

## 2023-09-30 DIAGNOSIS — O360130 Maternal care for anti-D [Rh] antibodies, third trimester, not applicable or unspecified: Secondary | ICD-10-CM | POA: Diagnosis not present

## 2023-09-30 DIAGNOSIS — Z3A28 28 weeks gestation of pregnancy: Secondary | ICD-10-CM

## 2023-09-30 DIAGNOSIS — Z794 Long term (current) use of insulin: Secondary | ICD-10-CM | POA: Diagnosis not present

## 2023-09-30 DIAGNOSIS — O24013 Pre-existing diabetes mellitus, type 1, in pregnancy, third trimester: Secondary | ICD-10-CM

## 2023-09-30 DIAGNOSIS — E669 Obesity, unspecified: Secondary | ICD-10-CM

## 2023-09-30 DIAGNOSIS — O24113 Pre-existing diabetes mellitus, type 2, in pregnancy, third trimester: Secondary | ICD-10-CM | POA: Diagnosis present

## 2023-09-30 DIAGNOSIS — O10013 Pre-existing essential hypertension complicating pregnancy, third trimester: Secondary | ICD-10-CM | POA: Insufficient documentation

## 2023-09-30 NOTE — Progress Notes (Signed)
 Patient information  Patient Name: Cassandra [name]  Patient MRN:   [id number]  Referring practice: MFM Referring Provider: [facility]  Problem List   Patient Active Problem List   Diagnosis Date Noted   Obesity affecting pregnancy, antepartum 07/19/2023   Rh negative state in antepartum period 06/19/2023   Supervision of high risk pregnancy, antepartum 06/17/2023   Endometriosis 09/20/2020   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/25/2019   Hashimoto's disease 01/2019   Major depressive disorder    MRSA cellulitis    Symptomatic mammary hypertrophy 03/17/2017   Insomnia 02/17/2017   Generalized anxiety disorder 12/14/2016   Type 1 diabetes mellitus in pregnancy, third trimester 12/10/2016   Hypertension 11/15/2016    Maternal Fetal medicine Consult  Cassandra [name] is a 23 y.o. G2P0010 at [redacted]w[redacted]d here for ultrasound and consultation. Cassandra [name] is doing well today with no acute concerns. Today we focused on the following:   The patient's pregnancy is complicated by type 1 diabetes.  She reports that her blood sugars are mostly not at goal but they are improving.  Her fasting blood sugars are in the 100-150 range.  Her postprandial blood sugars are in the 120-180 range.  She is adjusting her pump with her endocrinologist and her OB provider.  She understands the importance of proper glycemic control to minimize the risk of adverse outcomes.  We also discussed dietary discretion.  We discussed the importance of antenatal testing and delivery timing likely around 36 to 37 weeks.  The patient reports that she does not have any history of nephropathy but she does have neuropathy and some diabetic eye changes per her report.  Sonographic findings Single intrauterine pregnancy at 28w 3d.  Fetal cardiac activity:  Observed and appears normal. Presentation: Cephalic. Interval fetal anatomy appears  normal. Fetal biometry shows the estimated fetal weight at the 70 percentile. Amniotic fluid volume: Within normal limits. MVP: 5.77 cm. Placenta: Anterior.  There are limitations of prenatal ultrasound such as the inability to detect certain abnormalities due to poor visualization. Various factors such as fetal position, gestational age and maternal body habitus may increase the difficulty in visualizing the fetal anatomy.    Recommendations -Serial growth ultrasounds every 4 weeks until delivery -Antenatal testing to start around 32 weeks  -Delivery around 36-[redacted] weeks gestation  Review of Systems: A review of systems was performed and was negative except per HPI   Vitals and Physical Exam    09/30/2023    2:48 PM 09/22/2023    8:32 AM 09/22/2023    8:29 AM  Vitals with BMI  Weight   208 lbs  Systolic 122 120 858  Diastolic 81 81 83  Pulse 86 108 114    Sitting comfortably on the sonogram table Nonlabored breathing Normal rate and rhythm Abdomen is nontender  Past pregnancies OB History  Gravida Para Term Preterm AB Living  2 0 0 0 1 0  SAB IAB Ectopic Multiple Live Births  0 0 0 0 0    # Outcome Date GA Lbr Len/2nd Weight Sex Type Anes PTL Lv  2 Current           1 AB 08/19/22 [redacted]w[redacted]d            I spent 30 minutes reviewing the patients chart, including labs and images as well as counseling the patient about her medical conditions. Greater than 50% of the time was spent in direct face-to-face patient counseling.  Delora Smaller  MFM, [facility]   09/30/2023  3:39 PM

## 2023-10-01 ENCOUNTER — Other Ambulatory Visit: Payer: Self-pay | Admitting: *Deleted

## 2023-10-01 DIAGNOSIS — O99210 Obesity complicating pregnancy, unspecified trimester: Secondary | ICD-10-CM

## 2023-10-01 DIAGNOSIS — O24013 Pre-existing diabetes mellitus, type 1, in pregnancy, third trimester: Secondary | ICD-10-CM

## 2023-10-01 DIAGNOSIS — O10913 Unspecified pre-existing hypertension complicating pregnancy, third trimester: Secondary | ICD-10-CM

## 2023-10-05 ENCOUNTER — Ambulatory Visit: Admitting: Obstetrics and Gynecology

## 2023-10-05 ENCOUNTER — Other Ambulatory Visit: Payer: Self-pay

## 2023-10-05 VITALS — BP 120/75 | HR 97 | Wt 211.0 lb

## 2023-10-05 DIAGNOSIS — Z6791 Unspecified blood type, Rh negative: Secondary | ICD-10-CM | POA: Diagnosis not present

## 2023-10-05 DIAGNOSIS — Z3A29 29 weeks gestation of pregnancy: Secondary | ICD-10-CM | POA: Diagnosis not present

## 2023-10-05 DIAGNOSIS — I10 Essential (primary) hypertension: Secondary | ICD-10-CM

## 2023-10-05 DIAGNOSIS — O360931 Maternal care for other rhesus isoimmunization, third trimester, fetus 1: Secondary | ICD-10-CM

## 2023-10-05 DIAGNOSIS — O24013 Pre-existing diabetes mellitus, type 1, in pregnancy, third trimester: Secondary | ICD-10-CM

## 2023-10-05 DIAGNOSIS — O99283 Endocrine, nutritional and metabolic diseases complicating pregnancy, third trimester: Secondary | ICD-10-CM

## 2023-10-05 DIAGNOSIS — O10013 Pre-existing essential hypertension complicating pregnancy, third trimester: Secondary | ICD-10-CM | POA: Diagnosis not present

## 2023-10-05 DIAGNOSIS — K047 Periapical abscess without sinus: Secondary | ICD-10-CM

## 2023-10-05 DIAGNOSIS — O0990 Supervision of high risk pregnancy, unspecified, unspecified trimester: Secondary | ICD-10-CM

## 2023-10-05 DIAGNOSIS — E892 Postprocedural hypoparathyroidism: Secondary | ICD-10-CM

## 2023-10-05 MED ORDER — RHO D IMMUNE GLOBULIN 1500 UNIT/2ML IJ SOSY
300.0000 ug | PREFILLED_SYRINGE | Freq: Once | INTRAMUSCULAR | Status: AC
  Administered 2023-10-05: 300 ug via INTRAMUSCULAR

## 2023-10-05 NOTE — Progress Notes (Signed)
 PRENATAL VISIT NOTE  Subjective:  Cassandra [name] is a 23 y.o. G2P0010 at [redacted]w[redacted]d being seen today for ongoing prenatal care.  She is currently monitored for the following issues for this high-risk pregnancy and has Hypertension; Generalized anxiety disorder; Insomnia; Major depressive disorder; MRSA cellulitis; Hashimoto's disease; Symptomatic mammary hypertrophy; Post-surgical hypothyroidism; Post-surgical hypoparathyroidism; Endometriosis; Type 1 diabetes mellitus in pregnancy, third trimester; Supervision of high risk pregnancy, antepartum; Rh negative state in antepartum period; and Obesity affecting pregnancy, antepartum on their problem list.  Patient reports no complaints.  Contractions: Irritability. Vag. Bleeding: None.  Movement: Present. Denies leaking of fluid.   The following portions of the patient's history were reviewed and updated as appropriate: allergies, current medications, past family history, past medical history, past social history, past surgical history and problem list.   Objective:    Vitals:   10/05/23 1532  BP: 120/75  Pulse: 97  Weight: 211 lb (95.7 kg)    Fetal Status:  Fetal Heart Rate (bpm): 134 Fundal Height: 31 cm Movement: Present    General: Alert, oriented and cooperative. Patient is in no acute distress.  Skin: Skin is warm and dry. No rash noted.   Cardiovascular: Normal heart rate noted  Respiratory: Normal respiratory effort, no problems with respiration noted  Abdomen: Soft, gravid, appropriate for gestational age.  Pain/Pressure: Absent     Pelvic: Cervical exam deferred        Extremities: Normal range of motion.  Edema: Trace  Mental Status: Normal mood and affect. Normal behavior. Normal judgment and thought content.   Assessment and Plan:  Pregnancy: G2P0010 at [redacted]w[redacted]d  1. Rh negative state in antepartum period (Primary)  - Antibody screen - rho (d) immune globulin  (RHIG/RHOPHYLAC ) injection 300 mcg  2. Type 1 diabetes  mellitus in pregnancy, third trimester  - Uncontrolled BG.  - Normal fetal echo, normal growth 70%tile.  - Weekly BPP at 32 weeks - Delivery by 37 weeks or sooner if indicated - Approaching max basal settings on pump. Unable to change this unless we turn off IQ mode and Cassandra [name] is not comfortable with this.  - She continues to have 0300 lows due to auto bolus of insulin . Will continue to increase correction factor to avoid this.  - New settings:  12a 2.85 units per hour 3a 1.55 (no change)- lows around 0300 persistently  6a 2.65 increased from 2.60 units  8am 2.70 units per hour, increased from 2.65 units per hour.  3p 3.0 units per hour (max basal dose allowed) 9p 3.0 units,  increased from 2.95 units per hour.    Correction factor  6am: decrease to 1:19 8am 1:19 3 pm 1:18 9pm: decrease to 1:17 12am 1:23 3am- 1:25     ICR  1:3 from 3pm-9pm 1:4 rest of the time   3. Tooth abscess  Currently on antibiotics Dentist wants to pull 2 of her teeth due to concerns for abscess.  The office is requesting a letter stating that conscious sedation is ok in pregnancy.   Letter provided- reviewed letter with Dr. Abigail.  Recommend fetal monitoring for conscious sedation. Our office may have to assist with location in order for this to happen. Our contact information was provided and the letter encouraged the dental office to contact our office for further discussion.    4. Primary hypertension  BP normal Some extremity swelling, but no other symptoms suggestive of pre E.  ldASA No meds    5. Post-surgical hypoparathyroidism  F/w endo, last  seen 8/14 Calcium  level q2wk Continue calcitriol  & calcium  per endo   Preterm labor symptoms and general obstetric precautions including but not limited to vaginal bleeding, contractions, leaking of fluid and fetal movement were reviewed in detail with the patient. Please refer to After Visit Summary for other counseling recommendations.    No follow-ups on file.  Future Appointments  Date Time Provider Department Center  10/12/2023 11:10 AM Azarian Starace, Delon FERNS, NP [facility]  10/19/2023 11:10 AM Paradise Vensel, Delon FERNS, NP [facility]  10/26/2023 11:10 AM Merlean Pizzini, Delon FERNS, NP [facility]  10/28/2023  3:30 PM [facility] PROVIDER 1 [facility]  10/28/2023  3:45 PM [facility] US3 [facility]  11/02/2023 11:10 AM Timya Trimmer, Delon FERNS, NP [facility]  11/05/2023  8:00 AM Elnor Channing FALCON, PT [facility] None  11/12/2023  1:15 PM [facility] PROVIDER 1 [facility]  11/12/2023  1:30 PM [facility] US4 [facility]  11/19/2023  1:15 PM [facility] PROVIDER 1 [facility]  11/19/2023  1:30 PM [facility] US4 [facility]  11/26/2023  1:15 PM [facility] PROVIDER 1 [facility]  11/26/2023  1:30 PM [facility] US4 [facility]  12/03/2023  1:00 PM [facility] PROVIDER 1 [facility]  12/03/2023  1:30 PM [facility] US2 [facility]    Delon Emms, NP

## 2023-10-06 LAB — ANTIBODY SCREEN: Antibody Screen: NEGATIVE

## 2023-10-12 ENCOUNTER — Encounter: Admitting: Obstetrics and Gynecology

## 2023-10-18 ENCOUNTER — Telehealth: Payer: Self-pay

## 2023-10-18 NOTE — Telephone Encounter (Signed)
 RN received mychart message regarding decreased fetal movement. RN attempted to call patient to triage. RN left HIPAA compliant voicemail to return RN call.   Silvano LELON Piano, RN

## 2023-10-19 ENCOUNTER — Ambulatory Visit: Admitting: Obstetrics and Gynecology

## 2023-10-19 VITALS — BP 123/80 | HR 96 | Wt 210.0 lb

## 2023-10-19 DIAGNOSIS — O24013 Pre-existing diabetes mellitus, type 1, in pregnancy, third trimester: Secondary | ICD-10-CM | POA: Diagnosis not present

## 2023-10-19 DIAGNOSIS — I10 Essential (primary) hypertension: Secondary | ICD-10-CM

## 2023-10-19 DIAGNOSIS — O10013 Pre-existing essential hypertension complicating pregnancy, third trimester: Secondary | ICD-10-CM | POA: Diagnosis not present

## 2023-10-19 DIAGNOSIS — Z3A31 31 weeks gestation of pregnancy: Secondary | ICD-10-CM

## 2023-10-19 DIAGNOSIS — O368130 Decreased fetal movements, third trimester, not applicable or unspecified: Secondary | ICD-10-CM

## 2023-10-19 NOTE — Progress Notes (Signed)
 PRENATAL VISIT NOTE  Subjective:  Cassandra [name] is a 23 y.o. G2P0010 at [redacted]w[redacted]d being seen today for ongoing prenatal care.  She is currently monitored for the following issues for this high-risk pregnancy and has Hypertension; Generalized anxiety disorder; Insomnia; Major depressive disorder; MRSA cellulitis; Hashimoto's disease; Symptomatic mammary hypertrophy; Post-surgical hypothyroidism; Post-surgical hypoparathyroidism; Endometriosis; Type 1 diabetes mellitus in pregnancy, third trimester; Supervision of high risk pregnancy, antepartum; Rh negative state in antepartum period; and Obesity affecting pregnancy, antepartum on their problem list.  Patient reports no complaints and decreased fetal movement.  Contractions: Irregular. Vag. Bleeding: None.  Movement: (!) Decreased. Denies leaking of fluid.   The following portions of the patient's history were reviewed and updated as appropriate: allergies, current medications, past family history, past medical history, past social history, past surgical history and problem list.   Objective:    Vitals:   10/19/23 1112 10/19/23 1113 10/19/23 1141  BP: (!) 148/87 133/85 123/80  Pulse: (!) 105 (!) 101 96  Weight: 210 lb (95.3 kg)      Fetal Status:  Fetal Heart Rate (bpm): 143   Movement: (!) Decreased    General: Alert, oriented and cooperative. Patient is in no acute distress.  Skin: Skin is warm and dry. No rash noted.   Cardiovascular: Normal heart rate noted  Respiratory: Normal respiratory effort, no problems with respiration noted  Abdomen: Soft, gravid, appropriate for gestational age.  Pain/Pressure: Present     Pelvic: Cervical exam deferred        Extremities: Normal range of motion.  Edema: Trace  Mental Status: Normal mood and affect. Normal behavior. Normal judgment and thought content.   Assessment and Plan:  Pregnancy: G2P0010 at [redacted]w[redacted]d  1. Decreased fetal movements in third trimester, single or unspecified  fetus (Primary)  - Recently had teeth pulled through dentist. Doing well.  - Fetal nonstress test - Reactive, Baseline 145 bpm, 15x15 accels and 10x10 accels, + moderate variability. - Reviewed Fetal tracing with Dr. Herchel.   2. Type 1 diabetes mellitus in pregnancy, third trimester  BS are uncontrolled despite aggressive pump changes She is maxed out on basil for most of her settings. She is now giving separate humalog  injections with dinner to aide with meal peaks and pump locks. (self initiated)  She is agreeable to switching to manual mode on her pump.   Currently pump is delivering 65 units per day with approximately 61% bolus and 34% basal; likely related to connectivity issues between Dexcom and pump  - Uncontrolled BG.  - Normal fetal echo, normal growth 70%tile.  - Weekly BPP at 32 weeks - Delivery by 36-37 weeks or sooner if indicated  - New settings: Willing to trial Manual Mode.    12a 3.0 units per hour 3a 1.60 lows around 0300 persistently - will increase over a few days  6a 3.0 units per hour  8am 3.0 units per hour 3p 3.0 units per hour  9p 3.0 units per hour   Correction factor  6am-: decrease to 1:19 8am 1:19 3 pm 1:18 9pm: decrease to 1:17 12am 1:23 3am- 1:25     ICR  1:3 from 3pm-9pm 1:4 rest of the time  Discussed the importance of careful manual calculations of CR and CF. Try not to over correct, high sugars are better than low sugars. We can adjust slowly over the next few days.   3. Primary hypertension  Initial BP elevated, both subsequent BP's normal She has no symptoms of preeclampsia.  Continue BASA, low threshold to initiate BP meds along with weekly PIH labs if BP medications are started Return Thursday for a BP check Continue close follow up with MFM   Preterm labor symptoms and general obstetric precautions including but not limited to vaginal bleeding, contractions, leaking of fluid and fetal movement were reviewed in detail  with the patient. Please refer to After Visit Summary for other counseling recommendations.   No follow-ups on file.  Future Appointments  Date Time Provider Department Center  10/26/2023 11:10 AM Wojciech Willetts, Delon FERNS, NP [facility]  10/28/2023  3:30 PM [facility] PROVIDER 1 [facility]  10/28/2023  3:45 PM [facility] US3 [facility]  11/02/2023 11:10 AM Willis Kuipers, Delon FERNS, NP [facility]  11/05/2023  8:00 AM Elnor Channing FALCON, PT [facility] None  11/12/2023  1:15 PM [facility] PROVIDER 1 [facility]  11/12/2023  1:30 PM [facility] US4 [facility]  11/19/2023  1:15 PM [facility] PROVIDER 1 [facility]  11/19/2023  1:30 PM [facility] US4 [facility]  11/26/2023  1:15 PM [facility] PROVIDER 1 [facility]  11/26/2023  1:30 PM [facility] US4 [facility]  12/03/2023  1:00 PM [facility] PROVIDER 1 [facility]  12/03/2023  1:30 PM [facility] US2 [facility]    Delon Emms, NP

## 2023-10-21 ENCOUNTER — Ambulatory Visit: Admitting: Obstetrics and Gynecology

## 2023-10-21 VITALS — BP 137/86 | HR 105 | Wt 208.0 lb

## 2023-10-21 DIAGNOSIS — O24013 Pre-existing diabetes mellitus, type 1, in pregnancy, third trimester: Secondary | ICD-10-CM

## 2023-10-21 DIAGNOSIS — O99210 Obesity complicating pregnancy, unspecified trimester: Secondary | ICD-10-CM | POA: Diagnosis not present

## 2023-10-21 DIAGNOSIS — F411 Generalized anxiety disorder: Secondary | ICD-10-CM

## 2023-10-21 DIAGNOSIS — Z3A31 31 weeks gestation of pregnancy: Secondary | ICD-10-CM

## 2023-10-21 DIAGNOSIS — E063 Autoimmune thyroiditis: Secondary | ICD-10-CM

## 2023-10-21 DIAGNOSIS — O368130 Decreased fetal movements, third trimester, not applicable or unspecified: Secondary | ICD-10-CM

## 2023-10-21 DIAGNOSIS — O0990 Supervision of high risk pregnancy, unspecified, unspecified trimester: Secondary | ICD-10-CM

## 2023-10-21 DIAGNOSIS — I10 Essential (primary) hypertension: Secondary | ICD-10-CM

## 2023-10-21 NOTE — Addendum Note (Signed)
 Addended by: ORLINDA SILVANO ORN on: 10/21/2023 04:47 PM   Modules accepted: Orders

## 2023-10-21 NOTE — Progress Notes (Signed)
 PRENATAL VISIT NOTE  Subjective:  Cassandra [name] is a 23 y.o. G2P0010 at [redacted]w[redacted]d being seen today for ongoing prenatal care.  She is currently monitored for the following issues for this high-risk pregnancy and has Hypertension; Generalized anxiety disorder; Insomnia; Major depressive disorder; MRSA cellulitis; Hashimoto's disease; Symptomatic mammary hypertrophy; Post-surgical hypothyroidism; Post-surgical hypoparathyroidism; Endometriosis; Type 1 diabetes mellitus in pregnancy, third trimester; Supervision of high risk pregnancy, antepartum; Rh negative state in antepartum period; and Obesity affecting pregnancy, antepartum on their problem list.  Patient reports no complaints.  Contractions: Irregular. Vag. Bleeding: None.  Movement: (!) Decreased (Same as last time here). Denies leaking of fluid.   The following portions of the patient's history were reviewed and updated as appropriate: allergies, current medications, past family history, past medical history, past social history, past surgical history and problem list.   Objective:    Vitals:   10/21/23 1336  BP: 137/86  Pulse: (!) 105  Weight: 208 lb (94.3 kg)    Fetal Status:      Movement: (!) Decreased (Same as last time here)    General: Alert, oriented and cooperative. Patient is in no acute distress.  Skin: Skin is warm and dry. No rash noted.   Cardiovascular: Normal heart rate noted  Respiratory: Normal respiratory effort, no problems with respiration noted  Abdomen: Soft, gravid, appropriate for gestational age.  Pain/Pressure: Present (pressure)     Pelvic: Cervical exam deferred        Extremities: Normal range of motion.  Edema: Mild pitting, slight indentation  Mental Status: Normal mood and affect. Normal behavior. Normal judgment and thought content.   NST 150, mod var + accels. No decels Toco quiet  Assessment and Plan:  Pregnancy: G2P0010 at [redacted]w[redacted]d 1. Supervision of high risk pregnancy, antepartum  (Primary) Offered flu shot - pt accepts.  Discussed RSV - she would like to get this. Will schedule [facility] visit at [facility] for when she is there on 9/25 for her anatomy US .   2. Pregnancy with 31 completed weeks gestation  3. Type 1 diabetes mellitus in pregnancy, third trimester NST done today for DFM.  Delivery at 36-37w. MOD to be determined by next growth US .  Pump settings: 12a 3.0 units per hour 3a 1.60 lows around 0300 persistently - will increase over a few days  6a 3.0 units per hour  8am 3.0 units per hour 3p 3.0 units per hour  9p 3.0 units per hour   Correction factor  6am-:1:19 8am 1:19 3 pm 1:18 9pm: 1:17 12am 1:23 3am- 1:25   Carb ratio (15 min before meals) 1:3 from 3pm-9pm 1:4 rest of the time Would not change pump settings - only recently changed settings and first night of not having lows was last night. Need more data before adjusting.   4. Obesity affecting pregnancy, antepartum, unspecified obesity type Having serial growth d/t T1DM 09/30/23: 70%, AC 80%, AFI 16.  9/25 is next growth.   5. Generalized anxiety disorder Controlled.   6. Hashimoto's disease On Synthroid  200 mcg Check TRAB next visit.   7. Primary hypertension BP wnl No meds  Preterm labor symptoms and general obstetric precautions including but not limited to vaginal bleeding, contractions, leaking of fluid and fetal movement were reviewed in detail with the patient. Please refer to After Visit Summary for other counseling recommendations.   No follow-ups on file.  Future Appointments  Date Time Provider Department Center  10/26/2023 11:10 AM Rasch, Delon FERNS, NP [facility]  10/28/2023  3:30 PM [facility] PROVIDER 1 [facility]  10/28/2023  3:45 PM [facility] US3 [facility]  11/02/2023 11:10 AM Rasch, Delon FERNS, NP [facility]  11/05/2023  8:00 AM Elnor Channing FALCON, PT [facility] None  11/12/2023  1:15 PM [facility] PROVIDER 1 [facility]  11/12/2023  1:30 PM [facility] US4  [facility]  11/19/2023  1:15 PM [facility] PROVIDER 1 [facility]  11/19/2023  1:30 PM [facility] US4 [facility]  11/26/2023  1:15 PM [facility] PROVIDER 1 [facility]  11/26/2023  1:30 PM [facility] US4 [facility]  12/03/2023  1:00 PM [facility] PROVIDER 1 [facility]  12/03/2023  1:30 PM [facility] US2 [facility]    Vina Solian, MD

## 2023-10-26 ENCOUNTER — Ambulatory Visit: Admitting: Obstetrics and Gynecology

## 2023-10-26 VITALS — BP 112/75 | HR 90 | Wt 209.0 lb

## 2023-10-26 DIAGNOSIS — O24013 Pre-existing diabetes mellitus, type 1, in pregnancy, third trimester: Secondary | ICD-10-CM | POA: Diagnosis not present

## 2023-10-26 DIAGNOSIS — Z3A32 32 weeks gestation of pregnancy: Secondary | ICD-10-CM | POA: Diagnosis not present

## 2023-10-26 NOTE — Progress Notes (Signed)
 PRENATAL VISIT NOTE  Subjective:  Cassandra [name] is a 23 y.o. G2P0010 at [redacted]w[redacted]d being seen today for ongoing prenatal care.  She is currently monitored for the following issues for this high-risk pregnancy and has Hypertension; Generalized anxiety disorder; Insomnia; Major depressive disorder; MRSA cellulitis; Hashimoto's disease; Symptomatic mammary hypertrophy; Post-surgical hypothyroidism; Post-surgical hypoparathyroidism; Endometriosis; Type 1 diabetes mellitus in pregnancy, third trimester; Supervision of high risk pregnancy, antepartum; Rh negative state in antepartum period; and Obesity affecting pregnancy, antepartum on their problem list.  Patient reports no complaints.  Contractions: Irregular. Vag. Bleeding: None.  Movement: Present. Denies leaking of fluid.   The following portions of the patient's history were reviewed and updated as appropriate: allergies, current medications, past family history, past medical history, past social history, past surgical history and problem list.   Objective:    Vitals:   10/26/23 1115  BP: 112/75  Pulse: 90  Weight: 209 lb (94.8 kg)    Fetal Status:  Fetal Heart Rate (bpm): 159 Fundal Height: 34 cm Movement: Present    General: Alert, oriented and cooperative. Patient is in no acute distress.  Skin: Skin is warm and dry. No rash noted.   Cardiovascular: Normal heart rate noted  Respiratory: Normal respiratory effort, no problems with respiration noted  Abdomen: Soft, gravid, appropriate for gestational age.  Pain/Pressure: Present     Pelvic: Cervical exam deferred        Extremities: Normal range of motion.  Edema: Mild pitting, slight indentation  Mental Status: Normal mood and affect. Normal behavior. Normal judgment and thought content.   Assessment and Plan:  Pregnancy: G2P0010 at [redacted]w[redacted]d  BP normal today in the office. Reports good fetal movement   Delivery at 36-37w. MOD to be determined by next growth US .  Pump  settings:  Doing well on Manual Mode.  30% within target range. No further middle of the night low readings.   Target BS now set at 105  12a 3.0 units per hour, will increase to 3.10 units per hour.  3a 1.60 units per hour, will increase to 1.70 units per hour.  6a 3.0 units per hour, will increase to 3.10 units per hour. 8am 3.0 units per hour, will increase to 3.05 units per hour.  3p 3.0 units per hour, will increase to 3.10 units per hour 9p 3.0 units per hour, will increase to 3.10 units per hour.    Correction factor  6am-:1:19 8am 1:19 3 pm 1:18 9pm: 1:17 12am 1:23 3am- 1:25   Carb ratio (15 min before meals) 1:3 from 3pm-9pm, will decrease to 1:2 to cover meals.  1:4 rest of the time, will decrease to 1:3 to cover meals.   Growth Us  in 2 days with MFM   Preterm labor symptoms and general obstetric precautions including but not limited to vaginal bleeding, contractions, leaking of fluid and fetal movement were reviewed in detail with the patient. Please refer to After Visit Summary for other counseling recommendations.   No follow-ups on file.  Future Appointments  Date Time Provider Department Center  10/28/2023  2:30 PM [facility] NURSE [facility]  10/28/2023  3:30 PM [facility] PROVIDER 1 [facility]  10/28/2023  3:45 PM [facility] US3 [facility]  11/02/2023 11:10 AM Kip Cropp, Delon FERNS, NP [facility]  11/05/2023  8:00 AM Elnor Channing FALCON, PT [facility] None  11/12/2023  1:15 PM [facility] PROVIDER 1 [facility]  11/12/2023  1:30 PM [facility] US4 [facility]  11/19/2023  1:15 PM [facility] PROVIDER  1 [facility]  11/19/2023  1:30 PM [facility] US4 [facility]  11/26/2023  1:15 PM [facility] PROVIDER 1 [facility]  11/26/2023  1:30 PM [facility] US4 [facility]  12/03/2023  1:00 PM [facility] PROVIDER 1 [facility]  12/03/2023  1:30 PM [facility] US2 [facility]    Delon Emms, NP

## 2023-10-28 ENCOUNTER — Ambulatory Visit

## 2023-10-28 ENCOUNTER — Other Ambulatory Visit: Payer: Self-pay

## 2023-10-28 ENCOUNTER — Encounter: Payer: Self-pay | Admitting: Obstetrics and Gynecology

## 2023-10-28 ENCOUNTER — Ambulatory Visit: Attending: Obstetrics | Admitting: Obstetrics

## 2023-10-28 VITALS — BP 122/78 | HR 106

## 2023-10-28 VITALS — BP 111/77 | HR 106 | Ht 61.0 in | Wt 210.5 lb

## 2023-10-28 DIAGNOSIS — E1065 Type 1 diabetes mellitus with hyperglycemia: Secondary | ICD-10-CM

## 2023-10-28 DIAGNOSIS — O24013 Pre-existing diabetes mellitus, type 1, in pregnancy, third trimester: Secondary | ICD-10-CM

## 2023-10-28 DIAGNOSIS — O289 Unspecified abnormal findings on antenatal screening of mother: Secondary | ICD-10-CM | POA: Insufficient documentation

## 2023-10-28 DIAGNOSIS — O10013 Pre-existing essential hypertension complicating pregnancy, third trimester: Secondary | ICD-10-CM

## 2023-10-28 DIAGNOSIS — Z3A32 32 weeks gestation of pregnancy: Secondary | ICD-10-CM | POA: Diagnosis not present

## 2023-10-28 DIAGNOSIS — I10 Essential (primary) hypertension: Secondary | ICD-10-CM

## 2023-10-28 DIAGNOSIS — O99213 Obesity complicating pregnancy, third trimester: Secondary | ICD-10-CM

## 2023-10-28 DIAGNOSIS — O360130 Maternal care for anti-D [Rh] antibodies, third trimester, not applicable or unspecified: Secondary | ICD-10-CM | POA: Insufficient documentation

## 2023-10-28 DIAGNOSIS — Z794 Long term (current) use of insulin: Secondary | ICD-10-CM

## 2023-10-28 DIAGNOSIS — E669 Obesity, unspecified: Secondary | ICD-10-CM

## 2023-10-28 DIAGNOSIS — Z2911 Encounter for prophylactic immunotherapy for respiratory syncytial virus (RSV): Secondary | ICD-10-CM

## 2023-10-28 DIAGNOSIS — Z362 Encounter for other antenatal screening follow-up: Secondary | ICD-10-CM | POA: Insufficient documentation

## 2023-10-28 MED ORDER — INSULIN LISPRO (1 UNIT DIAL) 100 UNIT/ML (KWIKPEN): 20.0000 [IU] | PEN_INJECTOR | Freq: Three times a day (TID) | SUBCUTANEOUS | 5 refills | Status: DC

## 2023-10-28 MED ORDER — INSULIN SYRINGE 29G X 1/2" 0.3 ML MISC: 1.0000 | Freq: Three times a day (TID) | 6 refills | Status: AC

## 2023-10-28 NOTE — Progress Notes (Signed)
 MFM Consult Note  Cassandra [name] is currently at 32 weeks and 5 days.  She has been followed due to maternal obesity and uncontrolled pregestational diabetes.    She currently has an insulin  pump and a Dexcom continuous glucose monitor in place.    She reports that her fasting blood glucose levels have varied between 120's to 150's and many of her 2-hour postprandial blood sugar values are greater than 200.  She had a normal fetal echocardiogram performed with [facility].  Sonographic findings Single intrauterine pregnancy at 32w 3d.  Fetal cardiac activity:  Observed and appears normal. Presentation: Cephalic. Interval fetal anatomy appears normal. Fetal biometry shows the estimated fetal weight of 4 pounds 15 ounces measures at the 77th percentile. Amniotic fluid volume: Within normal limits. MVP: 5.57 cm. Placenta: Anterior. BPP: 8/8.   There are limitations of prenatal ultrasound such as the inability to detect certain abnormalities due to poor visualization. Various factors such as fetal position, gestational age and maternal body habitus may increase the difficulty in visualizing the fetal anatomy.    Pregestational diabetes  She was advised that our goals for her blood glucose values are fasting values of 90-95 or less and two-hour postprandial values of 120 or less.    As the majority of her blood glucose levels are higher than the stated goals, she was encouraged to try to watch her diet and to increase her physical activity to achieve better glycemic control.    The increased risk of adverse pregnancy outcomes such as a fetal demise associated with uncontrolled diabetes in pregnancy was discussed.    Fetal kick count instructions were reviewed.    We will continue to follow her with weekly fetal testing until delivery.   Due to uncontrolled pregestational diabetes, delivery may be considered at between 36 to 37 weeks.    She will return in 1 week  for an NST.    The patient stated that all of her questions were answered today.    A total of 20 minutes was spent counseling and coordinating the care for this patient.  Greater than 50% of the time was spent in direct face-to-face contact.

## 2023-10-28 NOTE — Progress Notes (Signed)
 Rindy is here for RSV vaccine. She is [redacted]w[redacted]d. RSV Immunization administered with no complications. Patient states she no questions or concerns.  Devon, RN

## 2023-11-02 ENCOUNTER — Ambulatory Visit (INDEPENDENT_AMBULATORY_CARE_PROVIDER_SITE_OTHER): Admitting: Obstetrics and Gynecology

## 2023-11-02 VITALS — BP 122/81 | HR 97 | Wt 213.0 lb

## 2023-11-02 DIAGNOSIS — I10 Essential (primary) hypertension: Secondary | ICD-10-CM | POA: Diagnosis not present

## 2023-11-02 DIAGNOSIS — O24013 Pre-existing diabetes mellitus, type 1, in pregnancy, third trimester: Secondary | ICD-10-CM | POA: Diagnosis not present

## 2023-11-02 DIAGNOSIS — O0990 Supervision of high risk pregnancy, unspecified, unspecified trimester: Secondary | ICD-10-CM

## 2023-11-02 NOTE — Progress Notes (Signed)
 PRENATAL VISIT NOTE  Subjective:  Cassandra [name] is a 23 y.o. G2P0010 at [redacted]w[redacted]d being seen today for ongoing prenatal care.  She is currently monitored for the following issues for this high-risk pregnancy and has Hypertension; Generalized anxiety disorder; Insomnia; Major depressive disorder; MRSA cellulitis; Hashimoto's disease; Symptomatic mammary hypertrophy; Post-surgical hypothyroidism; Post-surgical hypoparathyroidism; Endometriosis; Type 1 diabetes mellitus in pregnancy, third trimester; Supervision of high risk pregnancy, antepartum; Rh negative state in antepartum period; and Obesity affecting pregnancy, antepartum on their problem list.  Patient reports no complaints.  Contractions: Not present. Vag. Bleeding: None.  Movement: Present. Denies leaking of fluid.   The following portions of the patient's history were reviewed and updated as appropriate: allergies, current medications, past family history, past medical history, past social history, past surgical history and problem list.   Objective:    Vitals:   11/02/23 1115  BP: 122/81  Pulse: 97  Weight: 213 lb (96.6 kg)    Fetal Status:  Fetal Heart Rate (bpm): 150   Movement: Present    General: Alert, oriented and cooperative. Patient is in no acute distress.  Skin: Skin is warm and dry. No rash noted.   Cardiovascular: Normal heart rate noted  Respiratory: Normal respiratory effort, no problems with respiration noted  Abdomen: Soft, gravid, appropriate for gestational age.  Pain/Pressure: Present     Pelvic: Cervical exam deferred        Extremities: Normal range of motion.  Edema: None  Mental Status: Normal mood and affect. Normal behavior. Normal judgment and thought content.   Assessment and Plan:  Pregnancy: G2P0010 at [redacted]w[redacted]d   1. Primary hypertension (Primary)  Growth US  9/25 with reassuring findings. EFW 77%tile.  Continue close follow up with MFM.  BP normal.  Continue BASA    2. Type 1  diabetes mellitus in pregnancy, third trimester  Delivery at 36-37w given challenges of BS management in pregnancy and continued uncontrolled DM.  Will schedule for 36w; Ok with MFM. Induction orders placed.   Pump settings:   Doing well on Manual Mode.  29% within target range (65-140). No further middle of the night low readings sine moving to manual mode.    Target BS now set at 105> will decrease to 100   12a 3.20 units per hour, will increase to 3.35 units per hour.  3a 1.70 units per hour, will increase to 1.85 units per hour.  6a 3.10 units per hour, will increase to 3.25 units per hour. 8am 3.0 units per hour, will increase to 3.25 units per hour.  3p 3.10 units per hour, will increase to 3.20 units per hour 9p 3.20 units per hour, will increase to 3.35 units per hour.    Correction factor changes as below.  In addition to CR and CF she is giving additional Humalog  injections with dinner due to pump locks at 25 units with meal coverage.  She is consistently giving this 15-20 minutes before she eats.   Recommend she add additional Humalog  coverage with Pen with all meals.   6am-:1:18 8am 1:18 3 pm 1:17 9pm: 1:16 12am 1:23 3am- 1:25   Carb ratio (15 min before meals) 1:2 from 3pm-9pm 1:3 rest of the time   Weekly calcium  levels per Endocrinology.   3. Supervision of high risk pregnancy, antepartum  Reports normal/good fetal movement. Continue weekly visits per patient requests  Preterm labor symptoms and general obstetric precautions including but not limited to vaginal bleeding, contractions, leaking of fluid and fetal movement  were reviewed in detail with the patient. Please refer to After Visit Summary for other counseling recommendations.   No follow-ups on file.  Future Appointments  Date Time Provider Department Center  11/04/2023  9:45 AM [facility] NST [facility]  11/05/2023  8:00 AM Elnor Channing FALCON, PT [facility] None  11/11/2023  1:30 PM Delores Nidia CROME,  FNP [facility]  11/12/2023  1:15 PM [facility] PROVIDER 1 [facility]  11/12/2023  1:30 PM [facility] US4 [facility]  11/17/2023 11:10 AM Erik Kieth BROCKS, MD [facility]  11/19/2023  9:15 AM [facility] PROVIDER 1 [facility]  11/19/2023  9:30 AM [facility] US3 [facility]  11/20/2023  7:00 AM [facility] SCHED ROOM [facility] None  11/26/2023  1:15 PM [facility] PROVIDER 1 [facility]  11/26/2023  1:30 PM [facility] US4 [facility]  12/03/2023  1:00 PM [facility] PROVIDER 1 [facility]  12/03/2023  1:30 PM [facility] US2 [facility]    Delon Emms, NP

## 2023-11-04 ENCOUNTER — Ambulatory Visit: Attending: Obstetrics and Gynecology

## 2023-11-04 VITALS — BP 118/73 | HR 96

## 2023-11-04 DIAGNOSIS — E109 Type 1 diabetes mellitus without complications: Secondary | ICD-10-CM | POA: Diagnosis not present

## 2023-11-04 DIAGNOSIS — Z3A33 33 weeks gestation of pregnancy: Secondary | ICD-10-CM | POA: Diagnosis not present

## 2023-11-04 DIAGNOSIS — O24013 Pre-existing diabetes mellitus, type 1, in pregnancy, third trimester: Secondary | ICD-10-CM | POA: Insufficient documentation

## 2023-11-04 NOTE — Procedures (Signed)
 Cassandra [name] 07-31-2000 [redacted]w[redacted]d  Fetus A Non-Stress Test Interpretation for 11/04/23  Indication: Diabetes - NST only  Fetal Heart Rate A Mode: External Baseline Rate (A): 140 bpm Variability: Moderate Accelerations: 15 x 15 Decelerations: None Multiple birth?: No  Uterine Activity Mode: Palpation, Toco Contraction Frequency (min): none noted Resting Tone Palpated: Relaxed  Interpretation (Fetal Testing) Nonstress Test Interpretation: Reactive Comments: Reviewed with Dr. Arna

## 2023-11-05 ENCOUNTER — Encounter: Admitting: Physical Therapy

## 2023-11-05 ENCOUNTER — Other Ambulatory Visit: Payer: Self-pay

## 2023-11-05 ENCOUNTER — Encounter: Payer: Self-pay | Admitting: Physical Therapy

## 2023-11-05 ENCOUNTER — Ambulatory Visit: Attending: Obstetrics and Gynecology | Admitting: Physical Therapy

## 2023-11-05 ENCOUNTER — Other Ambulatory Visit: Payer: Self-pay | Admitting: Obstetrics and Gynecology

## 2023-11-05 DIAGNOSIS — R252 Cramp and spasm: Secondary | ICD-10-CM | POA: Insufficient documentation

## 2023-11-05 DIAGNOSIS — O0990 Supervision of high risk pregnancy, unspecified, unspecified trimester: Secondary | ICD-10-CM | POA: Insufficient documentation

## 2023-11-05 DIAGNOSIS — M5459 Other low back pain: Secondary | ICD-10-CM | POA: Insufficient documentation

## 2023-11-05 DIAGNOSIS — M5432 Sciatica, left side: Secondary | ICD-10-CM | POA: Diagnosis not present

## 2023-11-05 MED ORDER — FLUCONAZOLE 150 MG PO TABS: 150.0000 mg | ORAL_TABLET | Freq: Every day | ORAL | 0 refills | Status: DC

## 2023-11-05 NOTE — Therapy (Signed)
 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/25/2019   S/P thyroidectomy 01/27/2019   Symptomatic mammary hypertrophy 03/17/2017   Thyroid  goiter 05/09/2018   Type 1 diabetes, uncontrolled, 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   Past Surgical History:  Procedure Laterality Date   ADENOIDECTOMY     CHOLECYSTECTOMY     COLONOSCOPY     [facility] Surgery     ESOPHAGOGASTRODUODENOSCOPY     SPHINCTEROTOMY  03/16/2023   THYROIDECTOMY N/A 01/25/2019   Procedure: COMPLETE THYROIDECTOMY;  Surgeon: Karis Clunes, MD;  Location: [facility] OR;  Service: ENT;  Laterality: N/A;   TOE SURGERY Bilateral    for ingrown toenails   TYMPANOSTOMY TUBE PLACEMENT     WISDOM  TOOTH EXTRACTION     Patient Active Problem List   Diagnosis Date Noted   Obesity affecting pregnancy, antepartum 07/19/2023   Rh negative state in antepartum period 06/19/2023   Supervision of high risk pregnancy, antepartum 06/17/2023   Endometriosis 09/20/2020   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/25/2019   Hashimoto's disease 01/2019   Major depressive disorder    MRSA cellulitis    Symptomatic mammary hypertrophy 03/17/2017   Insomnia 02/17/2017   Generalized anxiety disorder 12/14/2016   Type 1 diabetes mellitus in pregnancy, third trimester 12/10/2016   Hypertension 11/15/2016    PCP: Godfrey Eck, MD  REFERRING PROVIDER: Cleatus Moccasin, MD   REFERRING DIAG:  O09.90 (ICD-10-CM) - Supervision of high risk pregnancy, antepartum  M54.32 (ICD-10-CM) - Sciatica of left side    THERAPY DIAG:  Other low back pain - Plan: PT plan of care cert/re-cert  Cramp and spasm - Plan: PT plan of care cert/re-cert  Rationale for Evaluation and Treatment: Rehabilitation  ONSET DATE: 05/2023  SUBJECTIVE:  SUBJECTIVE STATEMENT: Patient will be induced 11/20/23. Back pain became worse in April 2025.   FUNCTIONAL LIMITATIONS: no lifting more than 5#  PERTINENT HISTORY:  Medications for current condition: none Surgeries: see above Other: Diabetes type 1; MRSA cellulitis; Hashimoto's disease; Hypoparathyroidism; Endometriosis;  Sexual abuse: No  PAIN:  Are you having pain? Yes NPRS scale: 3-10/10 Pain location: low back down to left leg  Pain type: aching and sharp Pain description: intermittent   Aggravating factors: walking 20 minutes, cleaning Relieving factors: lay on the right side  PRECAUTIONS: Other: high risk pregnancy  RED  FLAGS: None   WEIGHT BEARING RESTRICTIONS: No  FALLS:  Has patient fallen in last 6 months? Yes. Number of falls 1 while walking the dogs and the dog ran behind her pulling her down. Not due to balance  OCCUPATION: not working due to medical reasons  ACTIVITY LEVEL : low level due to restrictions  PLOF: Independent  PATIENT GOALS: get back pain manageable   BOWEL MOVEMENT:no issues  URINATION: Leakage: none Pads/briefs: No  INTERCOURSE:  Ability to have vaginal penetration Yes  Pain with intercourse: Initial Penetration, During Penetration, and Deep Penetration; pain has happened during pregnancy Dryness: Yes  Climax: yes Marinoff Scale: 1/3 Lubricant: no  PREGNANCY: Vaginal deliveries 0 C-section deliveries 0 Currently pregnant Yes: 11/20/23   OBJECTIVE:  Note: Objective measures were completed at Evaluation unless otherwise noted.  DIAGNOSTIC FINDINGS:  none  PATIENT SURVEYS:  Modified Oswestry:  MODIFIED OSWESTRY DISABILITY SCALE  Date: 11/05/23 Score  Pain intensity 4 =  Pain medication provides me with little relief from pain.  2. Personal care (washing, dressing, etc.) 0 =  I can take care of myself normally without causing increased pain.  3. Lifting 4 = I can lift only very light weights  4. Walking 3 =  Pain prevents me from walking more than  mile.  5. Sitting 2 =  Pain prevents me from sitting more than 1 hour.  6. Standing 3 =  Pain prevents me from standing more than 1/2 hour.  7. Sleeping 4 =  Even when I take pain medication, I sleep less than 2 hour  8. Social Life 2 = Pain prevents me from participating in more energetic activities (eg. sports, dancing).  9. Traveling 1 =  I can travel anywhere, but it increases my pain.  10. Employment/ Homemaking 4 = Pain prevents me from doing even light duties.  Total 27/50   Interpretation of scores: Score Category Description  0-20% Minimal Disability The patient can cope with most living activities.  Usually no treatment is indicated apart from advice on lifting, sitting and exercise  21-40% Moderate Disability The patient experiences more pain and difficulty with sitting, lifting and standing. Travel and social life are more difficult and they may be disabled from work. Personal care, sexual activity and sleeping are not grossly affected, and the patient can usually be managed by conservative means  41-60% Severe Disability Pain remains the main problem in this group, but activities of daily living are affected. These patients require a detailed investigation  61-80% Crippled Back pain impinges on all aspects of the patient's life. Positive intervention is required  81-100% Bed-bound  These patients are either bed-bound or exaggerating their symptoms  Bluford FORBES Zoe DELENA Karon DELENA, et al. Surgery versus conservative management of stable thoracolumbar fracture: the PRESTO feasibility RCT. [place]): [employer]; 2021 Nov. [facility] Technology Assessment, No. 25.62.) Appendix 3, Oswestry Disability Index category descriptors.  Available from: FindJewel[employer].cz  Minimally Clinically Important Difference (MCID) = 12.8%   COGNITION: Overall cognitive status: Within functional limits for tasks assessed     SENSATION: Light touch: Appears intact  LUMBAR SPECIAL TESTS:  Negative slump test  FUNCTIONAL TESTS:  Single leg stance:  Rt: good  Lt: good Squat: no issues   POSTURE: rounded shoulders, forward head, and pregnant posture   LUMBARAROM/PROM:  A/PROM A/PROM  Eval (% available)  Flexion 50  Extension 25  Right lateral flexion 50  Left lateral flexion 50  Right rotation 50  Left rotation 50   (Blank rows = not tested)  LOWER EXTREMITY ROM: bilateral hip ROM is full   PALPATION:  General: tenderness located in the left side of lumbar, posterior left hip, along gluteus medius  Pelvic Alignment: right ilium rotated  anteriorly   TODAY'S TREATMENT:                                                                                                                              DATE: 11/05/23  EVAL Manual: Using the Addaday to the left gluteus and posterior hip to reduce trigger points Mobilization of left side of L2-L5 Mobilization of left SI joint Exercises: See below in HEP      PATIENT EDUCATION:  11/05/23 Education details: Access Code: [email or web address] Person educated: Patient Education method: Programmer, multimedia, Facilities manager, Actor cues, Verbal cues, and Handouts Education comprehension: verbalized understanding, returned demonstration, verbal cues required, tactile cues required, and needs further education  HOME EXERCISE PROGRAM: 11/05/23 Access Code: [email or web address] URL: [email or web address] Date: 11/05/2023 Prepared by: Channing Pereyra  Exercises - Sidelying Thoracic Rotation with Open Book  - 1 x daily - 7 x weekly - 1 sets - 10 reps - Quadruped Rocking Slow  - 1 x daily - 7 x weekly - 1 sets - 10 reps - Quadruped Yoga Block Lift Off  - 1 x daily - 7 x weekly - 1 sets - 10 reps   For all possible CPT codes, reference the Planned Interventions line above.     Check all conditions that are expected to impact treatment: {Conditions expected to impact treatment:None of these apply   If treatment provided at initial evaluation, no treatment charged due to lack of authorization.      ASSESSMENT:  CLINICAL IMPRESSION: Patient is a 23 y.o. female who was seen today for physical therapy evaluation and treatment for left sciatica pain. Patient is presently pregnant and will be induced with her first child on 11/20/23. Patient reports her sciatica pain increased April 2025. She reports her pain is 3-10/10 going from the left lumbar down the left leg. Her pain is worse with walking more than 20 minutes and cleaning. She has decreased lumbar mobility and bilateral SI joint. She tightness in the lumbar  and gluteal skin and tissue. She has palpable tenderness located on the left lumbar and left gluteus. Patient is trying to have a vaginal birth due to  her being high risk with Diabetes type 1. Patient will benefit from skilled therapy to work on mobility and reduction of pain to prepare for child birth.   OBJECTIVE IMPAIRMENTS: decreased activity tolerance, decreased ROM, decreased strength, increased muscle spasms, and pain.   ACTIVITY LIMITATIONS: carrying, lifting, bending, sitting, and standing  PARTICIPATION LIMITATIONS: cleaning  PERSONAL FACTORS: Time since onset of injury/illness/exacerbation are also affecting patient's functional outcome.   REHAB POTENTIAL: Excellent  CLINICAL DECISION MAKING: Evolving/moderate complexity  EVALUATION COMPLEXITY: Moderate   GOALS: Goals reviewed with patient? Yes   LONG TERM GOALS: Target date: 12/03/23  Patient is able to clean with correct body mechanics and back pain not going above 4/10.  Baseline: back pain 10/10 Goal status: INITIAL  2.  Patient has increased in lumbar ROM to perform tasks at home with pain not going above 4/10.  Baseline: pain level 10/10.  Goal status: INITIAL  3.  Patient educated on ways to give birth with reduction of strain on her lumbar.  Baseline: not educated yet Goal status: INITIAL    PLAN:  PT FREQUENCY: 1-2x/week  PT DURATION: 4 weeks  PLANNED INTERVENTIONS: 97110-Therapeutic exercises, 97530- Therapeutic activity, 97112- Neuromuscular re-education, 97535- Self Care, [id number]- Manual therapy, Patient/Family education, Joint mobilization, Spinal mobilization, Cryotherapy, and Moist heat  PLAN FOR NEXT SESSION: manual work to lumbar spine for mobility; manual work to the lumbar and left gluteus, exercises to stretch the SI joint, stretches for the spine, back strength, educated on postures while giving birth to reduce pressure on the spine.    Channing Pereyra, PT 11/05/23 10:04 AM

## 2023-11-09 ENCOUNTER — Ambulatory Visit: Admitting: Rehabilitative and Restorative Service Providers"

## 2023-11-09 NOTE — Addendum Note (Signed)
 Addended by: DORITA NEST I on: 11/09/2023 09:19 AM   Modules accepted: Orders

## 2023-11-10 ENCOUNTER — Telehealth: Payer: Self-pay

## 2023-11-10 ENCOUNTER — Other Ambulatory Visit: Payer: Self-pay

## 2023-11-10 ENCOUNTER — Inpatient Hospital Stay (HOSPITAL_COMMUNITY)
Admission: AD | Admit: 2023-11-10 | Discharge: 2023-11-20 | DRG: 805 | Disposition: A | Discharge status: Home / Self Care | Attending: Obstetrics and Gynecology | Admitting: Obstetrics and Gynecology

## 2023-11-10 ENCOUNTER — Encounter (HOSPITAL_COMMUNITY): Payer: Self-pay | Admitting: Family Medicine

## 2023-11-10 ENCOUNTER — Inpatient Hospital Stay (HOSPITAL_COMMUNITY)

## 2023-11-10 DIAGNOSIS — E1010 Type 1 diabetes mellitus with ketoacidosis without coma: Secondary | ICD-10-CM | POA: Diagnosis present

## 2023-11-10 DIAGNOSIS — N179 Acute kidney failure, unspecified: Secondary | ICD-10-CM | POA: Diagnosis not present

## 2023-11-10 DIAGNOSIS — E108 Type 1 diabetes mellitus with unspecified complications: Secondary | ICD-10-CM | POA: Diagnosis not present

## 2023-11-10 DIAGNOSIS — O368130 Decreased fetal movements, third trimester, not applicable or unspecified: Secondary | ICD-10-CM | POA: Diagnosis present

## 2023-11-10 DIAGNOSIS — O10013 Pre-existing essential hypertension complicating pregnancy, third trimester: Secondary | ICD-10-CM

## 2023-11-10 DIAGNOSIS — O24013 Pre-existing diabetes mellitus, type 1, in pregnancy, third trimester: Secondary | ICD-10-CM

## 2023-11-10 DIAGNOSIS — Z87891 Personal history of nicotine dependence: Secondary | ICD-10-CM

## 2023-11-10 DIAGNOSIS — O99344 Other mental disorders complicating childbirth: Secondary | ICD-10-CM | POA: Diagnosis present

## 2023-11-10 DIAGNOSIS — Z3A35 35 weeks gestation of pregnancy: Secondary | ICD-10-CM

## 2023-11-10 DIAGNOSIS — O1092 Unspecified pre-existing hypertension complicating childbirth: Secondary | ICD-10-CM | POA: Diagnosis present

## 2023-11-10 DIAGNOSIS — Z23 Encounter for immunization: Secondary | ICD-10-CM

## 2023-11-10 DIAGNOSIS — Z7982 Long term (current) use of aspirin: Secondary | ICD-10-CM

## 2023-11-10 DIAGNOSIS — O24019 Pre-existing diabetes mellitus, type 1, in pregnancy, unspecified trimester: Secondary | ICD-10-CM | POA: Diagnosis present

## 2023-11-10 DIAGNOSIS — F329 Major depressive disorder, single episode, unspecified: Secondary | ICD-10-CM | POA: Diagnosis present

## 2023-11-10 DIAGNOSIS — Z833 Family history of diabetes mellitus: Secondary | ICD-10-CM

## 2023-11-10 DIAGNOSIS — O26833 Pregnancy related renal disease, third trimester: Secondary | ICD-10-CM | POA: Diagnosis not present

## 2023-11-10 DIAGNOSIS — O99284 Endocrine, nutritional and metabolic diseases complicating childbirth: Secondary | ICD-10-CM | POA: Diagnosis present

## 2023-11-10 DIAGNOSIS — E1065 Type 1 diabetes mellitus with hyperglycemia: Secondary | ICD-10-CM | POA: Diagnosis present

## 2023-11-10 DIAGNOSIS — Z794 Long term (current) use of insulin: Secondary | ICD-10-CM

## 2023-11-10 DIAGNOSIS — E669 Obesity, unspecified: Secondary | ICD-10-CM

## 2023-11-10 DIAGNOSIS — Z79899 Other long term (current) drug therapy: Secondary | ICD-10-CM

## 2023-11-10 DIAGNOSIS — Z91040 Latex allergy status: Secondary | ICD-10-CM | POA: Diagnosis not present

## 2023-11-10 DIAGNOSIS — Z3A34 34 weeks gestation of pregnancy: Secondary | ICD-10-CM

## 2023-11-10 DIAGNOSIS — Z79890 Hormone replacement therapy (postmenopausal): Secondary | ICD-10-CM

## 2023-11-10 DIAGNOSIS — O99214 Obesity complicating childbirth: Secondary | ICD-10-CM | POA: Diagnosis present

## 2023-11-10 DIAGNOSIS — E892 Postprocedural hypoparathyroidism: Secondary | ICD-10-CM | POA: Diagnosis present

## 2023-11-10 DIAGNOSIS — E109 Type 1 diabetes mellitus without complications: Secondary | ICD-10-CM | POA: Diagnosis not present

## 2023-11-10 DIAGNOSIS — R000 Tachycardia, unspecified: Secondary | ICD-10-CM | POA: Diagnosis not present

## 2023-11-10 DIAGNOSIS — I10 Essential (primary) hypertension: Secondary | ICD-10-CM | POA: Diagnosis present

## 2023-11-10 DIAGNOSIS — O2402 Pre-existing diabetes mellitus, type 1, in childbirth: Principal | ICD-10-CM | POA: Diagnosis present

## 2023-11-10 DIAGNOSIS — O134 Gestational [pregnancy-induced] hypertension without significant proteinuria, complicating childbirth: Secondary | ICD-10-CM | POA: Diagnosis not present

## 2023-11-10 DIAGNOSIS — O411230 Chorioamnionitis, third trimester, not applicable or unspecified: Secondary | ICD-10-CM | POA: Diagnosis present

## 2023-11-10 DIAGNOSIS — E890 Postprocedural hypothyroidism: Secondary | ICD-10-CM | POA: Diagnosis present

## 2023-11-10 DIAGNOSIS — O9962 Diseases of the digestive system complicating childbirth: Secondary | ICD-10-CM | POA: Diagnosis present

## 2023-11-10 DIAGNOSIS — M25552 Pain in left hip: Secondary | ICD-10-CM | POA: Diagnosis not present

## 2023-11-10 DIAGNOSIS — O99213 Obesity complicating pregnancy, third trimester: Secondary | ICD-10-CM

## 2023-11-10 DIAGNOSIS — E66813 Obesity, class 3: Secondary | ICD-10-CM | POA: Diagnosis present

## 2023-11-10 DIAGNOSIS — O26899 Other specified pregnancy related conditions, unspecified trimester: Secondary | ICD-10-CM

## 2023-11-10 DIAGNOSIS — O26893 Other specified pregnancy related conditions, third trimester: Secondary | ICD-10-CM | POA: Diagnosis present

## 2023-11-10 DIAGNOSIS — K219 Gastro-esophageal reflux disease without esophagitis: Secondary | ICD-10-CM | POA: Diagnosis present

## 2023-11-10 DIAGNOSIS — Z8249 Family history of ischemic heart disease and other diseases of the circulatory system: Secondary | ICD-10-CM | POA: Diagnosis not present

## 2023-11-10 DIAGNOSIS — Z6791 Unspecified blood type, Rh negative: Secondary | ICD-10-CM

## 2023-11-10 LAB — COMPREHENSIVE METABOLIC PANEL WITH GFR
ALT: 16 U/L (ref 0–44)
AST: 21 U/L (ref 15–41)
Albumin: 2.4 g/dL — ABNORMAL LOW (ref 3.5–5.0)
Alkaline Phosphatase: 117 U/L (ref 38–126)
Anion gap: 14 (ref 5–15)
BUN: 7 mg/dL (ref 6–20)
CO2: 20 mmol/L — ABNORMAL LOW (ref 22–32)
Calcium: 7.8 mg/dL — ABNORMAL LOW (ref 8.9–10.3)
Chloride: 99 mmol/L (ref 98–111)
Creatinine, Ser: 0.83 mg/dL (ref 0.44–1.00)
GFR, Estimated: >60 mL/min (ref >60)
Glucose, Bld: 319 mg/dL — ABNORMAL HIGH (ref 70–99)
Potassium: 3.8 mmol/L (ref 3.5–5.1)
Sodium: 133 mmol/L — ABNORMAL LOW (ref 135–145)
Total Bilirubin: 0.6 mg/dL (ref 0.0–1.2)
Total Protein: 7.1 g/dL (ref 6.5–8.1)

## 2023-11-10 LAB — CBC WITH DIFFERENTIAL/PLATELET
Abs Immature Granulocytes: 0.21 K/uL — ABNORMAL HIGH (ref 0.00–0.07)
Basophils Absolute: 0.1 K/uL (ref 0.0–0.1)
Basophils Relative: 0 %
Eosinophils Absolute: 0.1 K/uL (ref 0.0–0.5)
Eosinophils Relative: 1 %
HCT: 33.9 % — ABNORMAL LOW (ref 36.0–46.0)
Hemoglobin: 10.9 g/dL — ABNORMAL LOW (ref 12.0–15.0)
Immature Granulocytes: 2 %
Lymphocytes Relative: 14 %
Lymphs Abs: 1.9 K/uL (ref 0.7–4.0)
MCH: 26.1 pg (ref 26.0–34.0)
MCHC: 32.2 g/dL (ref 30.0–36.0)
MCV: 81.1 fL (ref 80.0–100.0)
Monocytes Absolute: 0.7 K/uL (ref 0.1–1.0)
Monocytes Relative: 5 %
Neutro Abs: 10.5 K/uL — ABNORMAL HIGH (ref 1.7–7.7)
Neutrophils Relative %: 78 %
Platelets: 223 K/uL (ref 150–400)
RBC: 4.18 MIL/uL (ref 3.87–5.11)
RDW: 14.6 % (ref 11.5–15.5)
WBC: 13.4 K/uL — ABNORMAL HIGH (ref 4.0–10.5)
nRBC: 0.4 % — ABNORMAL HIGH (ref 0.0–0.2)

## 2023-11-10 LAB — TYPE AND SCREEN
ABO/RH(D): A NEG
Antibody Screen: POSITIVE

## 2023-11-10 LAB — GLUCOSE, CAPILLARY: Glucose-Capillary: 289 mg/dL — ABNORMAL HIGH (ref 70–99)

## 2023-11-10 LAB — BETA-HYDROXYBUTYRIC ACID: Beta-Hydroxybutyric Acid: 0.55 mmol/L — ABNORMAL HIGH (ref 0.05–0.27)

## 2023-11-10 MED ORDER — ONDANSETRON HCL 4 MG/2ML IJ SOLN: 4.0000 mg | Freq: Four times a day (QID) | INTRAMUSCULAR | Status: DC | PRN

## 2023-11-10 MED ORDER — LIDOCAINE HCL (PF) 1 % IJ SOLN: 30.0000 mL | INTRAMUSCULAR | Status: DC | PRN

## 2023-11-10 MED ORDER — PRENATAL MULTIVITAMIN CH: 1.0000 | ORAL_TABLET | Freq: Every day | ORAL | Status: DC

## 2023-11-10 MED ORDER — OXYCODONE-ACETAMINOPHEN 5-325 MG PO TABS: 2.0000 | ORAL_TABLET | ORAL | Status: DC | PRN

## 2023-11-10 MED ORDER — DOCUSATE SODIUM 100 MG PO CAPS
100.0000 mg | ORAL_CAPSULE | Freq: Every day | ORAL | Status: DC
Start: 2023-11-11 — End: 2023-11-15
  Administered 2023-11-11 – 2023-11-15 (×3): 100 mg via ORAL
  Filled 2023-11-10 (×5): qty 1

## 2023-11-10 MED ORDER — INSULIN ASPART 100 UNIT/ML IJ SOLN
10.0000 [IU] | Freq: Once | INTRAMUSCULAR | Status: AC
  Administered 2023-11-10: 10 [IU] via SUBCUTANEOUS

## 2023-11-10 MED ORDER — ACETAMINOPHEN 325 MG PO TABS: 650.0000 mg | ORAL_TABLET | ORAL | Status: DC | PRN

## 2023-11-10 MED ORDER — LACTATED RINGERS IV SOLN: 500.0000 mL | INTRAVENOUS | Status: DC | PRN

## 2023-11-10 MED ORDER — DEXTROSE IN LACTATED RINGERS 5 % IV SOLN: INTRAVENOUS | Status: AC

## 2023-11-10 MED ORDER — SOD CITRATE-CITRIC ACID 500-334 MG/5ML PO SOLN: 30.0000 mL | ORAL | Status: DC | PRN

## 2023-11-10 MED ORDER — POTASSIUM CHLORIDE 10 MEQ/100ML IV SOLN
10.0000 meq | INTRAVENOUS | Status: DC
  Filled 2023-11-10 (×2): qty 100

## 2023-11-10 MED ORDER — OXYTOCIN BOLUS FROM INFUSION: 333.0000 mL | Freq: Once | INTRAVENOUS | Status: DC

## 2023-11-10 MED ORDER — OXYTOCIN-SODIUM CHLORIDE 30-0.9 UT/500ML-% IV SOLN: 2.5000 [IU]/h | INTRAVENOUS | Status: DC

## 2023-11-10 MED ORDER — DEXTROSE 50 % IV SOLN: 0.0000 mL | INTRAVENOUS | Status: DC | PRN

## 2023-11-10 MED ORDER — POTASSIUM CHLORIDE CRYS ER 20 MEQ PO TBCR
20.0000 meq | EXTENDED_RELEASE_TABLET | Freq: Every day | ORAL | Status: DC
  Administered 2023-11-11 – 2023-11-16 (×6): 20 meq via ORAL
  Filled 2023-11-10 (×9): qty 1

## 2023-11-10 MED ORDER — LACTATED RINGERS IV BOLUS
1000.0000 mL | Freq: Once | INTRAVENOUS | Status: AC
  Administered 2023-11-10: 1000 mL via INTRAVENOUS

## 2023-11-10 MED ORDER — INSULIN REGULAR(HUMAN) IN NACL 100-0.9 UT/100ML-% IV SOLN
INTRAVENOUS | Status: AC
  Administered 2023-11-10: 4.6 [IU]/h via INTRAVENOUS
  Administered 2023-11-11: 7.5 [IU]/h via INTRAVENOUS
  Filled 2023-11-10 (×3): qty 100

## 2023-11-10 MED ORDER — LACTATED RINGERS IV SOLN: INTRAVENOUS | Status: DC

## 2023-11-10 MED ORDER — LACTATED RINGERS IV SOLN: INTRAVENOUS | Status: AC

## 2023-11-10 MED ORDER — CALCIUM CARBONATE ANTACID 500 MG PO CHEW
2.0000 | CHEWABLE_TABLET | ORAL | Status: DC | PRN
  Administered 2023-11-13: 400 mg via ORAL
  Filled 2023-11-10: qty 2

## 2023-11-10 MED ORDER — OXYCODONE-ACETAMINOPHEN 5-325 MG PO TABS: 1.0000 | ORAL_TABLET | ORAL | Status: DC | PRN

## 2023-11-10 MED ORDER — LACTATED RINGERS IV SOLN: 125.0000 mL/h | INTRAVENOUS | Status: AC

## 2023-11-10 NOTE — H&P (Signed)
 History     CSN: [id number]  Arrival date and time: 11/10/23 1858   Event Date/Time   First Provider Initiated Contact with Patient 11/10/23 1937      Chief Complaint  Patient presents with   Decreased Fetal Movement   Hyperglycemia   HPI  Cassandra [name] is a 23 y.o. G2P0010 at [redacted]w[redacted]d who presents for evaluation of elevated blood sugars. Patient reports for the last 2 days, her CBG's have been elevated in the 300-400s. She has been working with her outpatient DM provider to adjust her insulin  without much success. She does not feel bad otherwise but is worried about the increased blood sugars. She reports intermittent cramping. She denies any vaginal bleeding, discharge, and leaking of fluid. Denies any constipation, diarrhea or any urinary complaints. Reports normal fetal movement.   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/25/2019   S/P thyroidectomy 01/27/2019   Symptomatic mammary hypertrophy 03/17/2017   Thyroid  goiter 05/09/2018   Type 1  diabetes, uncontrolled, 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    Past Surgical History:  Procedure Laterality Date   ADENOIDECTOMY     CHOLECYSTECTOMY     COLONOSCOPY     [facility] Surgery     ESOPHAGOGASTRODUODENOSCOPY     SPHINCTEROTOMY  03/16/2023   THYROIDECTOMY N/A 01/25/2019   Procedure: COMPLETE THYROIDECTOMY;  Surgeon: Karis Clunes, MD;  Location: [facility] OR;  Service: ENT;  Laterality: N/A;   TOE SURGERY Bilateral    for ingrown toenails   TYMPANOSTOMY TUBE PLACEMENT     WISDOM TOOTH EXTRACTION      Family History  Problem Relation Age of Onset   Hypertension Maternal Grandmother    Anxiety disorder Maternal Grandmother    ADD / ADHD Maternal Grandmother    Diabetes Maternal Grandmother    Hyperlipidemia Maternal Grandfather    Hypothyroidism Maternal Grandfather    Cancer Maternal Grandfather        blood cancer (unknown name)   Hyperlipidemia Mother    Anxiety disorder Mother    Hyperlipidemia Other    Migraines Neg Hx    Seizures Neg Hx    Depression Neg Hx    Autism Neg Hx    Bipolar disorder Neg Hx    Schizophrenia Neg Hx  Social History   Tobacco Use   Smoking status: Former    Current packs/day: 0.00    Average packs/day: 0.1 packs/day for 5.0 years (0.7 ttl pk-yrs)    Types: Cigarettes    Start date: 01/22/2014    Quit date: 01/23/2019    Years since quitting: 4.8    Passive exposure: Current (mom smokes)   Smokeless tobacco: Never  Vaping Use   Vaping status: Former  Substance Use Topics   Alcohol use: No   Drug use: No    Allergies:  Allergies  Allergen Reactions   Shellfish-Derived Products Diarrhea and Nausea And Vomiting   Tape Itching    Medical tape causes itching   Amoxicillin-Pot Clavulanate Nausea And Vomiting and Other (See Comments)   Lactose Intolerance (Gi)    Shellfish Allergy Nausea And Vomiting and Other (See Comments)   Tilactase Nausea And Vomiting   Latex Rash    Medications Prior to Admission  Medication Sig Dispense Refill Last  Dose/Taking   amitriptyline  (ELAVIL ) 75 MG tablet Take 1 tablet (75 mg total) by mouth at bedtime. 90 tablet 1 11/09/2023   aspirin  EC 81 MG tablet Take 1 tablet (81 mg total) by mouth daily. Swallow whole. 30 tablet 12 11/10/2023   calcitRIOL  (ROCALTROL ) 0.5 MCG capsule Take 0.5 mcg by mouth daily.   11/10/2023   calcium  carbonate (OSCAL) 1500 (600 Ca) MG TABS tablet Take 1,500 mg by mouth in the morning, at noon, and at bedtime.   11/10/2023   citalopram  (CELEXA ) 10 MG tablet Take 10 mg by mouth daily.   11/09/2023   famotidine  (PEPCID ) 20 MG tablet Take 1 tablet (20 mg total) by mouth 2 (two) times daily. 60 tablet 3 11/09/2023   fluconazole  (DIFLUCAN ) 150 MG tablet Take 1 tablet (150 mg total) by mouth daily. Take one dose today and repeat in 3 days. 2 tablet 0    fluticasone  (FLONASE ) 50 MCG/ACT nasal spray Place 2 sprays into both nostrils daily. 1 g 3 11/10/2023   insulin  lispro (HUMALOG  KWIKPEN) 100 UNIT/ML KwikPen Inject 20 Units into the skin 3 (three) times daily. 18 mL 5 11/10/2023   insulin  lispro (HUMALOG ) 100 UNIT/ML injection INJECT UP TO 300 UNITS IN INSULIN  PUMP EVERY 48 HOURS, PER DKA AND HYPERGLYCEMIA PROTOCOLS 40 mL 5 11/10/2023   levothyroxine  (SYNTHROID ) 200 MCG tablet Take 200 mcg by mouth daily.   11/10/2023   Melatonin 1 MG CAPS Take by mouth.   11/09/2023   nystatin  powder Apply 1 Application topically 3 (three) times daily. 15 g 2 Past Month   Prenatal Vit-Fe Fumarate-FA (MULTIVITAMIN-PRENATAL) 27-0.8 MG TABS tablet Take 1 tablet by mouth daily at 12 noon.   11/09/2023   amoxicillin (AMOXIL) 500 MG capsule Take 500 mg by mouth every 8 (eight) hours.      Blood Glucose Monitoring Suppl (ACCU-CHEK GUIDE) w/Device KIT 1 kit by Does not apply route daily as needed. 2 kit 5    Continuous Blood Gluc Receiver (DEXCOM G6 RECEIVER) DEVI 1 Device by Does not apply route daily as needed. 1 each 0    Continuous Blood Gluc Sensor (DEXCOM G6 SENSOR) MISC CHANGE SENSOR EVERY 10 DAYS AS DIRECTED. 3  each 5    Continuous Blood Gluc Transmit (DEXCOM G6 TRANSMITTER) MISC 1 kit by Does not apply route daily as needed. 1 each 1    Glucagon  (BAQSIMI  TWO PACK) 3 MG/DOSE POWD Place 1 each into the nose as needed (severe hypoglycmia with unresponsiveness). 1 each 3  Insulin  Syringe-Needle U-100 (INSULIN  SYRINGE .3CC/29GX1/2) 29G X 1/2 0.3 ML MISC 1 each by Does not apply route 3 (three) times daily. 100 each 6    Ostomy Supplies (ADHESIVE REMOVER WIPES) MISC Use as directed for pump and CGM sites. 50 each 3     Review of Systems  Constitutional: Negative.  Negative for fatigue and fever.  HENT: Negative.    Respiratory: Negative.  Negative for shortness of breath.   Cardiovascular: Negative.  Negative for chest pain.  Gastrointestinal: Negative.  Negative for abdominal pain, constipation, diarrhea, nausea and vomiting.  Genitourinary: Negative.  Negative for dysuria.  Neurological: Negative.  Negative for dizziness and headaches.   Physical Exam   Blood pressure 135/80, pulse 90, temperature 98.2 F (36.8 C), resp. rate 18, last menstrual period 03/15/2023, SpO2 99%.  Patient Vitals for the past 24 hrs:  BP Temp Pulse Resp SpO2  11/10/23 1932 135/80 98.2 F (36.8 C) 90 18 99 %    Physical Exam Vitals and nursing note reviewed.  Constitutional:      General: She is not in acute distress.    Appearance: She is well-developed.  HENT:     Head: Normocephalic.  Eyes:     Pupils: Pupils are equal, round, and reactive to light.  Cardiovascular:     Rate and Rhythm: Normal rate and regular rhythm.     Heart sounds: Normal heart sounds.  Pulmonary:     Effort: Pulmonary effort is normal. No respiratory distress.     Breath sounds: Normal breath sounds.  Abdominal:     General: Bowel sounds are normal. There is no distension.     Palpations: Abdomen is soft.     Tenderness: There is no abdominal tenderness.  Skin:    General: Skin is warm and dry.  Neurological:     Mental  Status: She is alert and oriented to person, place, and time.  Psychiatric:        Mood and Affect: Mood normal.        Behavior: Behavior normal.        Thought Content: Thought content normal.        Judgment: Judgment normal.     Fetal Tracing:  Baseline: 150 Variability: moderate Accels: 15x15 Decels: none  Toco: UI      MAU Course  Procedures  Results for orders placed or performed during the hospital encounter of 11/10/23 (from the past 24 hours)  CBC with Differential/Platelet     Status: Abnormal   Collection Time: 11/10/23  8:01 PM  Result Value Ref Range   WBC 13.4 (H) 4.0 - 10.5 K/uL   RBC 4.18 3.87 - 5.11 MIL/uL   Hemoglobin 10.9 (L) 12.0 - 15.0 g/dL   HCT 66.0 (L) 63.9 - 53.9 %   MCV 81.1 80.0 - 100.0 fL   MCH 26.1 26.0 - 34.0 pg   MCHC 32.2 30.0 - 36.0 g/dL   RDW 85.3 88.4 - 84.4 %   Platelets 223 150 - 400 K/uL   nRBC 0.4 (H) 0.0 - 0.2 %   Neutrophils Relative % 78 %   Neutro Abs 10.5 (H) 1.7 - 7.7 K/uL   Lymphocytes Relative 14 %   Lymphs Abs 1.9 0.7 - 4.0 K/uL   Monocytes Relative 5 %   Monocytes Absolute 0.7 0.1 - 1.0 K/uL   Eosinophils Relative 1 %   Eosinophils Absolute 0.1 0.0 - 0.5 K/uL   Basophils Relative 0 %   Basophils Absolute 0.1 0.0 -  0.1 K/uL   Immature Granulocytes 2 %   Abs Immature Granulocytes 0.21 (H) 0.00 - 0.07 K/uL  Comprehensive metabolic panel     Status: Abnormal   Collection Time: 11/10/23  8:01 PM  Result Value Ref Range   Sodium 133 (L) 135 - 145 mmol/L   Potassium 3.8 3.5 - 5.1 mmol/L   Chloride 99 98 - 111 mmol/L   CO2 20 (L) 22 - 32 mmol/L   Glucose, Bld 319 (H) 70 - 99 mg/dL   BUN 7 6 - 20 mg/dL   Creatinine, Ser 9.16 0.44 - 1.00 mg/dL   Calcium  7.8 (L) 8.9 - 10.3 mg/dL   Total Protein 7.1 6.5 - 8.1 g/dL   Albumin 2.4 (L) 3.5 - 5.0 g/dL   AST 21 15 - 41 U/L   ALT 16 0 - 44 U/L   Alkaline Phosphatase 117 38 - 126 U/L   Total Bilirubin 0.6 0.0 - 1.2 mg/dL   GFR, Estimated >39 >39 mL/min   Anion gap 14 5  - 15  Beta-hydroxybutyric acid     Status: Abnormal   Collection Time: 11/10/23  8:01 PM  Result Value Ref Range   Beta-Hydroxybutyric Acid 0.55 (H) 0.05 - 0.27 mmol/L   MDM Labs ordered and reviewed.   Dr. Ozan notified of patient arrival when presented to MAU and working with DOROTHA Emms NP on plan of care, manages DM outpatient. MD agreeable to plan of care  CBGs: 1930- 336 1945- 10u Insulin  SQ 2015- 289 2045: 10u Insulin  SQ 2115: 236, eating and 25u with pump  Will eat low carb dinner and adjust pump to compensate. If needed, will give additional SQ insulin  based on calculation.  Dr. Ozan notified of labs and persistent elevated blood sugars despite interventions. Will come see patient in MAU and discuss admission.   Assessment and Plan   1. Type 1 diabetes mellitus in pregnancy, third trimester   2. [redacted] weeks gestation of pregnancy    -Care turned over to MD  Aleck CHRISTELLA Fireman, CNM 11/10/2023, 7:38 PM

## 2023-11-10 NOTE — MAU Note (Signed)
 Cassandra [name] is a 23 y.o. at [redacted]w[redacted]d here in MAU reporting: Type I DM, elevated CBG on dexcom. Also reporting no fetal movement since yesterday at 8pm   Pain score: 0/10 Vitals:   11/10/23 1932  BP: 135/80  Pulse: 90  Resp: 18  Temp: 98.2 F (36.8 C)  SpO2: 99%     FHT: 170

## 2023-11-10 NOTE — Telephone Encounter (Signed)
 RN called patient to confirm receipt of message to go to MAU to be evaluated due to elevated blood sugars. Patient confirmed would go to MAU and that for 2 days had consistent elevated over 200, has just now come down to 150.   Silvano LELON Piano, RN

## 2023-11-11 ENCOUNTER — Encounter: Admitting: Obstetrics and Gynecology

## 2023-11-11 LAB — GLUCOSE, CAPILLARY
Glucose-Capillary: 105 mg/dL — ABNORMAL HIGH (ref 70–99)
Glucose-Capillary: 124 mg/dL — ABNORMAL HIGH (ref 70–99)
Glucose-Capillary: 134 mg/dL — ABNORMAL HIGH (ref 70–99)
Glucose-Capillary: 139 mg/dL — ABNORMAL HIGH (ref 70–99)
Glucose-Capillary: 158 mg/dL — ABNORMAL HIGH (ref 70–99)
Glucose-Capillary: 162 mg/dL — ABNORMAL HIGH (ref 70–99)
Glucose-Capillary: 163 mg/dL — ABNORMAL HIGH (ref 70–99)
Glucose-Capillary: 176 mg/dL — ABNORMAL HIGH (ref 70–99)
Glucose-Capillary: 180 mg/dL — ABNORMAL HIGH (ref 70–99)
Glucose-Capillary: 185 mg/dL — ABNORMAL HIGH (ref 70–99)
Glucose-Capillary: 201 mg/dL — ABNORMAL HIGH (ref 70–99)
Glucose-Capillary: 203 mg/dL — ABNORMAL HIGH (ref 70–99)
Glucose-Capillary: 184 mg/dL — ABNORMAL HIGH (ref 70–99)
Glucose-Capillary: 212 mg/dL — ABNORMAL HIGH (ref 70–99)

## 2023-11-11 LAB — CBC
HCT: 31 % — ABNORMAL LOW (ref 36.0–46.0)
Hemoglobin: 10.3 g/dL — ABNORMAL LOW (ref 12.0–15.0)
MCH: 26.6 pg (ref 26.0–34.0)
MCHC: 33.2 g/dL (ref 30.0–36.0)
MCV: 80.1 fL (ref 80.0–100.0)
Platelets: 269 K/uL (ref 150–400)
RBC: 3.87 MIL/uL (ref 3.87–5.11)
RDW: 14.6 % (ref 11.5–15.5)
WBC: 13.2 K/uL — ABNORMAL HIGH (ref 4.0–10.5)
nRBC: 0.3 % — ABNORMAL HIGH (ref 0.0–0.2)

## 2023-11-11 LAB — BASIC METABOLIC PANEL WITH GFR
Anion gap: 10 (ref 5–15)
Anion gap: 10 (ref 5–15)
BUN: 5 mg/dL — ABNORMAL LOW (ref 6–20)
BUN: 7 mg/dL (ref 6–20)
CO2: 21 mmol/L — ABNORMAL LOW (ref 22–32)
CO2: 22 mmol/L (ref 22–32)
Calcium: 7.8 mg/dL — ABNORMAL LOW (ref 8.9–10.3)
Calcium: 7.9 mg/dL — ABNORMAL LOW (ref 8.9–10.3)
Chloride: 104 mmol/L (ref 98–111)
Chloride: 105 mmol/L (ref 98–111)
Creatinine, Ser: 0.71 mg/dL (ref 0.44–1.00)
Creatinine, Ser: 0.71 mg/dL (ref 0.44–1.00)
GFR, Estimated: >60 mL/min (ref >60)
GFR, Estimated: >60 mL/min (ref >60)
Glucose, Bld: 128 mg/dL — ABNORMAL HIGH (ref 70–99)
Glucose, Bld: 155 mg/dL — ABNORMAL HIGH (ref 70–99)
Potassium: 3.4 mmol/L — ABNORMAL LOW (ref 3.5–5.1)
Potassium: 3.6 mmol/L (ref 3.5–5.1)
Sodium: 136 mmol/L (ref 135–145)
Sodium: 136 mmol/L (ref 135–145)

## 2023-11-11 LAB — BETA-HYDROXYBUTYRIC ACID
Beta-Hydroxybutyric Acid: 0.08 mmol/L (ref 0.05–0.27)
Beta-Hydroxybutyric Acid: 0.11 mmol/L (ref 0.05–0.27)

## 2023-11-11 LAB — RPR: RPR Ser Ql: NONREACTIVE

## 2023-11-11 MED ORDER — CITALOPRAM HYDROBROMIDE 20 MG PO TABS
20.0000 mg | ORAL_TABLET | Freq: Every day | ORAL | Status: DC
  Administered 2023-11-11 – 2023-11-19 (×10): 20 mg via ORAL
  Filled 2023-11-11 (×12): qty 1

## 2023-11-11 MED ORDER — CITALOPRAM HYDROBROMIDE 10 MG PO TABS
10.0000 mg | ORAL_TABLET | Freq: Every day | ORAL | Status: DC
Start: 2023-11-11 — End: 2023-11-11

## 2023-11-11 MED ORDER — LEVOTHYROXINE SODIUM 25 MCG PO TABS
200.0000 ug | ORAL_TABLET | ORAL | Status: AC
Start: 2023-11-11 — End: 2023-11-11
  Administered 2023-11-11: 200 ug via ORAL
  Filled 2023-11-11: qty 8

## 2023-11-11 MED ORDER — INSULIN ASPART 100 UNIT/ML IJ SOLN
25.0000 [IU] | Freq: Three times a day (TID) | INTRAMUSCULAR | Status: DC
  Administered 2023-11-11: 25 [IU] via SUBCUTANEOUS

## 2023-11-11 MED ORDER — FAMOTIDINE 20 MG PO TABS
20.0000 mg | ORAL_TABLET | Freq: Two times a day (BID) | ORAL | Status: DC
  Administered 2023-11-11 – 2023-11-17 (×13): 20 mg via ORAL
  Filled 2023-11-11 (×13): qty 1

## 2023-11-11 MED ORDER — PRENATAL MULTI +DHA 27-0.8-250 MG PO CAPS
1.0000 | ORAL_CAPSULE | Freq: Every day | ORAL | Status: DC
  Administered 2023-11-11 – 2023-11-14 (×4): 1 via ORAL
  Filled 2023-11-11 (×8): qty 1

## 2023-11-11 MED ORDER — AMITRIPTYLINE HCL 25 MG PO TABS
25.0000 mg | ORAL_TABLET | Freq: Every day | ORAL | Status: DC
  Administered 2023-11-11 – 2023-11-17 (×8): 25 mg via ORAL
  Filled 2023-11-11 (×10): qty 1

## 2023-11-11 MED ORDER — NON FORMULARY: 1.0000 | Freq: Every day | Status: DC

## 2023-11-11 MED ORDER — INSULIN ASPART 100 UNIT/ML IJ SOLN
0.0000 [IU] | Freq: Three times a day (TID) | INTRAMUSCULAR | Status: DC
  Administered 2023-11-11: 5 [IU] via SUBCUTANEOUS

## 2023-11-11 MED ORDER — LEVOTHYROXINE SODIUM 100 MCG PO TABS
200.0000 ug | ORAL_TABLET | Freq: Every day | ORAL | Status: DC
  Administered 2023-11-12 – 2023-11-17 (×6): 200 ug via ORAL
  Filled 2023-11-11 (×4): qty 2
  Filled 2023-11-11 (×2): qty 8
  Filled 2023-11-11 (×3): qty 2

## 2023-11-11 MED ORDER — MELATONIN 5 MG PO TABS
10.0000 mg | ORAL_TABLET | Freq: Every day | ORAL | Status: DC
  Administered 2023-11-11 – 2023-11-17 (×7): 10 mg via ORAL
  Filled 2023-11-11 (×10): qty 2

## 2023-11-11 MED ORDER — FLUTICASONE PROPIONATE 50 MCG/ACT NA SUSP
2.0000 | Freq: Every day | NASAL | Status: DC
  Administered 2023-11-11 – 2023-11-16 (×5): 2 via NASAL
  Filled 2023-11-11 (×2): qty 16

## 2023-11-11 MED ORDER — INSULIN GLARGINE 100 UNIT/ML ~~LOC~~ SOLN
60.0000 [IU] | SUBCUTANEOUS | Status: DC
  Administered 2023-11-11: 60 [IU] via SUBCUTANEOUS
  Filled 2023-11-11 (×2): qty 0.6

## 2023-11-11 MED ORDER — INSULIN ASPART 100 UNIT/ML IJ SOLN
0.0000 [IU] | Freq: Every day | INTRAMUSCULAR | Status: DC
  Administered 2023-11-11: 2 [IU] via SUBCUTANEOUS

## 2023-11-11 MED ORDER — ASPIRIN 81 MG PO TBEC
81.0000 mg | DELAYED_RELEASE_TABLET | Freq: Every day | ORAL | Status: DC
Start: 2023-11-11 — End: 2023-11-16
  Administered 2023-11-11 – 2023-11-15 (×5): 81 mg via ORAL
  Filled 2023-11-11 (×7): qty 1

## 2023-11-11 MED ORDER — INSULIN ASPART 100 UNIT/ML IJ SOLN: 0.0000 [IU] | Freq: Three times a day (TID) | INTRAMUSCULAR | Status: DC

## 2023-11-11 NOTE — Inpatient Diabetes Management (Signed)
 Inpatient Diabetes Program Recommendations  Diabetes Treatment Program Recommendations  ADA Standards of Care Diabetes in Pregnancy Target Glucose Ranges:  Fasting: 70 - 95 mg/dL 1 hr postprandial: Less than 140mg /dL (from first bite of meal) 2 hr postprandial: Less than 120 mg/dL (from first bite of meal)    Lab Results  Component Value Date   GLUCAP 184 (H) 11/11/2023   HGBA1C 6.7 (H) 09/15/2023    Review of Glycemic Control  Latest Reference Range & Units 11/11/23 03:19 11/11/23 03:51 11/11/23 04:23 11/11/23 04:53 11/11/23 05:55 11/11/23 07:04 11/11/23 08:11  Glucose-Capillary 70 - 99 mg/dL 798 (H) 814 (H) 823 (H) 158 (H) 134 (H) 105 (H) 184 (H)  (H): Data is abnormally high Diabetes history: Type 1 DM Outpatient Diabetes medications: Tandem + Humalog  0-20 units TID  Current orders for Inpatient glycemic control: IV insulin   Inpatient Diabetes Program Recommendations:    Based on current Insulin  needs, patient is resistant to insulin  and free from acidosis.  When ready to transition: -Lantus  60 units two hours prior to discontinuation of IV insulin , then every day to follow -Novolog  25 units TID (assuming patient is consuming >50% of meals) - Novolog  0-14 units TID & HS  Pump settings prior to admission:  Basal insulin   0000 3.35 units/hour 0300 1.85 units/hour 0600 3.25 units/hour 0800 3.25 units/hour 1500 3.20 units/hour 2100 3.35 units/hour Total daily basal insulin : 73.65 units/24 hours  Carb Coverage 0000 1:3 1 unit for every 3 grams of carbohydrates 1500 1:2 1 unit for every 2 grams of carbohydrates  Insulin  Sensitivity 0000 1:23 1 unit drops blood glucose 23 mg/dl 9699 8:74 1 unit drops blood glucose 25 mg/dl 9399 8:81 1 unit drops blood glucose 18 mg/dl 8499 8:82 1 unit drops blood glucose 17 mg/dl  Spoke with patient at length regarding outpatient diabetes management. Patient was initially seen by Dr Beryl, outpatient endocrinology and care was  transferred to J.Rasch, NP with OB team. Reviewed pump settings, see above. Per chart review many challenges have presented towards BS control with a 29% within targets noted from 9/30 prenatal. Patient reports that she is really resistant to carbs and needs have been increasing. Recently, NP had been increasing doses for CHO coverage in tandem while also addition TID injections, however rates had to be decreased due to issues with hypoglycemia in early AM hours. Post prandial readings usually range from 150-250 mg/dL, while fastings are usually exceeding 150's mg/dL. Patient prefers to use pump, however, difficulty with target goals even while in manual mode and top doses per bolus (which exceeds patient needs). Thus, do not recommend continued use. At this time, discussed use of SQ injections until delivery to achieve target goals of 90-120 mg/dL. Patient is in agreement.  Reviewed with patient patho of type 1 DM in pregnancy, hormonal fluctuations and resistance to insulin , hyperinsulinemia in neonate, risks for NICU admission/hypoglycemia following delivery, need for improvement, injections vs insulin  pump, and plan following delivery.  Discussed case with Dr Alger and plan moving forward.   In preparation for delivery: would plan to resume pre-pregnant settings below:  Basal insulin   0000 1.250 units/hour 0400 1.100 units/hour 0600 1.150 units/hour 0800 1.200 units/hour 1500 1.250 units/hour 2100 1.40 units/hour Total daily basal insulin : 29.6 units/24 hours  Carb Coverage 0000 1:10 1 unit for every 10 grams of carbohydrates 1200 1:6 1 unit for every 6 grams of carbohydrates  Insulin  Sensitivity 0000 1:30 1 unit drops blood glucose 30 mg/dl    Thanks, Tinnie Minus, MSN,  [facility] Diabetes Coordinator [phone] (8a-5p)

## 2023-11-11 NOTE — Plan of Care (Signed)
  Problem: Education: Goal: Knowledge of disease or condition will improve Outcome: Progressing Goal: Knowledge of the prescribed therapeutic regimen will improve Outcome: Progressing Goal: Individualized Educational Video(s) Outcome: Progressing   Problem: Clinical Measurements: Goal: Complications related to the disease process, condition or treatment will be avoided or minimized Outcome: Progressing   Problem: Education: Goal: Knowledge of General Education information will improve Description: Including pain rating scale, medication(s)/side effects and non-pharmacologic comfort measures Outcome: Progressing   Problem: Health Behavior/Discharge Planning: Goal: Ability to manage health-related needs will improve Outcome: Progressing   Problem: Clinical Measurements: Goal: Ability to maintain clinical measurements within normal limits will improve Outcome: Progressing Goal: Will remain free from infection Outcome: Progressing Goal: Diagnostic test results will improve Outcome: Progressing Goal: Respiratory complications will improve Outcome: Progressing Goal: Cardiovascular complication will be avoided Outcome: Progressing   Problem: Activity: Goal: Risk for activity intolerance will decrease Outcome: Progressing   Problem: Nutrition: Goal: Adequate nutrition will be maintained Outcome: Progressing   Problem: Coping: Goal: Level of anxiety will decrease Outcome: Progressing   Problem: Elimination: Goal: Will not experience complications related to bowel motility Outcome: Progressing Goal: Will not experience complications related to urinary retention Outcome: Progressing   Problem: Pain Managment: Goal: General experience of comfort will improve and/or be controlled Outcome: Progressing   Problem: Safety: Goal: Ability to remain free from injury will improve Outcome: Progressing   Problem: Skin Integrity: Goal: Risk for impaired skin integrity will  decrease Outcome: Progressing   Problem: Education: Goal: Ability to describe self-care measures that may prevent or decrease complications (Diabetes Survival Skills Education) will improve Outcome: Progressing Goal: Individualized Educational Video(s) Outcome: Progressing   Problem: Coping: Goal: Ability to adjust to condition or change in health will improve Outcome: Progressing   Problem: Fluid Volume: Goal: Ability to maintain a balanced intake and output will improve Outcome: Progressing   Problem: Health Behavior/Discharge Planning: Goal: Ability to identify and utilize available resources and services will improve Outcome: Progressing Goal: Ability to manage health-related needs will improve Outcome: Progressing   Problem: Metabolic: Goal: Ability to maintain appropriate glucose levels will improve Outcome: Progressing   Problem: Nutritional: Goal: Maintenance of adequate nutrition will improve Outcome: Progressing Goal: Progress toward achieving an optimal weight will improve Outcome: Progressing   Problem: Skin Integrity: Goal: Risk for impaired skin integrity will decrease Outcome: Progressing   Problem: Tissue Perfusion: Goal: Adequacy of tissue perfusion will improve Outcome: Progressing

## 2023-11-11 NOTE — Progress Notes (Signed)
 FACULTY PRACTICE ANTEPARTUM(COMPREHENSIVE) NOTE  Cassandra [name] is a 23 y.o. G2P0010 with Estimated Date of Delivery: 12/18/23   By  early ultrasound [redacted]w[redacted]d  who is admitted for hyperglycemia/DKA.    Fetal presentation is cephalic. Length of Stay:  1  Days  Date of admission:11/10/2023  Subjective: Patient resting comfortably in bed.  Reports no acute complaints or concerns.  Denies nausea or vomiting.  Denies abdominal or pelvic pain.  No headache no blurry vision Patient reports the fetal movement as active. Patient reports uterine contraction  activity as none. Patient reports  vaginal bleeding as none. Patient describes fluid per vagina as None.  Vitals:  Blood pressure 114/62, pulse 79, temperature 97.8 F (36.6 C), temperature source Oral, resp. rate 16, last menstrual period 03/15/2023, SpO2 100%. Vitals:   11/10/23 1932 11/10/23 2234 11/11/23 0457  BP: 135/80 131/77 114/62  Pulse: 90 (!) 105 79  Resp: 18 17 16   Temp: 98.2 F (36.8 C) 98 F (36.7 C) 97.8 F (36.6 C)  TempSrc:  Oral Oral  SpO2: 99% 100% 100%   Physical Examination:  General appearance - alert, well appearing, and in no distress Mental status - normal mood, behavior, speech, dress, motor activity, and thought processes Chest - CTAB Heart - normal rate and regular rhythm Abdomen - gravid, soft and non-tender Musculoskeletal - no calf tenderness bilaterally Extremities - minimal edema Skin - warm and dry   Fetal Monitoring:  Baseline: 150 bpm, Variability: moderate, Accelerations: +15x15 x 2, and Decelerations: Absent    reactive  Labs:  Results for orders placed or performed during the hospital encounter of 11/10/23 (from the past 24 hours)  CBC with Differential/Platelet   Collection Time: 11/10/23  8:01 PM  Result Value Ref Range   WBC 13.4 (H) 4.0 - 10.5 K/uL   RBC 4.18 3.87 - 5.11 MIL/uL   Hemoglobin 10.9 (L) 12.0 - 15.0 g/dL   HCT 66.0 (L) 63.9 - 53.9 %   MCV 81.1 80.0 - 100.0 fL    MCH 26.1 26.0 - 34.0 pg   MCHC 32.2 30.0 - 36.0 g/dL   RDW 85.3 88.4 - 84.4 %   Platelets 223 150 - 400 K/uL   nRBC 0.4 (H) 0.0 - 0.2 %   Neutrophils Relative % 78 %   Neutro Abs 10.5 (H) 1.7 - 7.7 K/uL   Lymphocytes Relative 14 %   Lymphs Abs 1.9 0.7 - 4.0 K/uL   Monocytes Relative 5 %   Monocytes Absolute 0.7 0.1 - 1.0 K/uL   Eosinophils Relative 1 %   Eosinophils Absolute 0.1 0.0 - 0.5 K/uL   Basophils Relative 0 %   Basophils Absolute 0.1 0.0 - 0.1 K/uL   Immature Granulocytes 2 %   Abs Immature Granulocytes 0.21 (H) 0.00 - 0.07 K/uL  Comprehensive metabolic panel   Collection Time: 11/10/23  8:01 PM  Result Value Ref Range   Sodium 133 (L) 135 - 145 mmol/L   Potassium 3.8 3.5 - 5.1 mmol/L   Chloride 99 98 - 111 mmol/L   CO2 20 (L) 22 - 32 mmol/L   Glucose, Bld 319 (H) 70 - 99 mg/dL   BUN 7 6 - 20 mg/dL   Creatinine, Ser 9.16 0.44 - 1.00 mg/dL   Calcium  7.8 (L) 8.9 - 10.3 mg/dL   Total Protein 7.1 6.5 - 8.1 g/dL   Albumin 2.4 (L) 3.5 - 5.0 g/dL   AST 21 15 - 41 U/L   ALT 16 0 - 44  U/L   Alkaline Phosphatase 117 38 - 126 U/L   Total Bilirubin 0.6 0.0 - 1.2 mg/dL   GFR, Estimated >39 >39 mL/min   Anion gap 14 5 - 15  Beta-hydroxybutyric acid   Collection Time: 11/10/23  8:01 PM  Result Value Ref Range   Beta-Hydroxybutyric Acid 0.55 (H) 0.05 - 0.27 mmol/L  Type and screen [facility]   Collection Time: 11/10/23  8:01 PM  Result Value Ref Range   ABO/RH(D) A NEG    Antibody Screen POS    Sample Expiration 11/13/2023,2359    Antibody Identification      PASSIVELY ACQUIRED ANTI-D Performed at [facility], [address] [place], [place] [address]   Glucose, capillary   Collection Time: 11/10/23  8:17 PM  Result Value Ref Range   Glucose-Capillary 289 (H) 70 - 99 mg/dL  Glucose, capillary   Collection Time: 11/10/23 11:02 PM  Result Value Ref Range   Glucose-Capillary 180 (H) 70 - 99 mg/dL  Glucose, capillary   Collection Time:  11/10/23 11:38 PM  Result Value Ref Range   Glucose-Capillary 163 (H) 70 - 99 mg/dL  Basic metabolic panel   Collection Time: 11/11/23 12:04 AM  Result Value Ref Range   Sodium 136 135 - 145 mmol/L   Potassium 3.6 3.5 - 5.1 mmol/L   Chloride 105 98 - 111 mmol/L   CO2 21 (L) 22 - 32 mmol/L   Glucose, Bld 155 (H) 70 - 99 mg/dL   BUN 7 6 - 20 mg/dL   Creatinine, Ser 9.28 0.44 - 1.00 mg/dL   Calcium  7.8 (L) 8.9 - 10.3 mg/dL   GFR, Estimated >39 >39 mL/min   Anion gap 10 5 - 15  Beta-hydroxybutyric acid   Collection Time: 11/11/23 12:04 AM  Result Value Ref Range   Beta-Hydroxybutyric Acid 0.11 0.05 - 0.27 mmol/L  CBC   Collection Time: 11/11/23 12:04 AM  Result Value Ref Range   WBC 13.2 (H) 4.0 - 10.5 K/uL   RBC 3.87 3.87 - 5.11 MIL/uL   Hemoglobin 10.3 (L) 12.0 - 15.0 g/dL   HCT 68.9 (L) 63.9 - 53.9 %   MCV 80.1 80.0 - 100.0 fL   MCH 26.6 26.0 - 34.0 pg   MCHC 33.2 30.0 - 36.0 g/dL   RDW 85.3 88.4 - 84.4 %   Platelets 269 150 - 400 K/uL   nRBC 0.3 (H) 0.0 - 0.2 %  RPR   Collection Time: 11/11/23 12:04 AM  Result Value Ref Range   RPR Ser Ql NON REACTIVE NON REACTIVE  Glucose, capillary   Collection Time: 11/11/23 12:10 AM  Result Value Ref Range   Glucose-Capillary 162 (H) 70 - 99 mg/dL  Glucose, capillary   Collection Time: 11/11/23 12:42 AM  Result Value Ref Range   Glucose-Capillary 139 (H) 70 - 99 mg/dL  Glucose, capillary   Collection Time: 11/11/23  1:47 AM  Result Value Ref Range   Glucose-Capillary 124 (H) 70 - 99 mg/dL  Glucose, capillary   Collection Time: 11/11/23  2:45 AM  Result Value Ref Range   Glucose-Capillary 203 (H) 70 - 99 mg/dL  Glucose, capillary   Collection Time: 11/11/23  3:19 AM  Result Value Ref Range   Glucose-Capillary 201 (H) 70 - 99 mg/dL  Glucose, capillary   Collection Time: 11/11/23  3:51 AM  Result Value Ref Range   Glucose-Capillary 185 (H) 70 - 99 mg/dL  Glucose, capillary   Collection Time: 11/11/23  4:23 AM  Result  Value Ref Range   Glucose-Capillary 176 (H) 70 - 99 mg/dL  Glucose, capillary   Collection Time: 11/11/23  4:53 AM  Result Value Ref Range   Glucose-Capillary 158 (H) 70 - 99 mg/dL  Beta-hydroxybutyric acid   Collection Time: 11/11/23  5:36 AM  Result Value Ref Range   Beta-Hydroxybutyric Acid 0.08 0.05 - 0.27 mmol/L  Basic metabolic panel   Collection Time: 11/11/23  5:36 AM  Result Value Ref Range   Sodium 136 135 - 145 mmol/L   Potassium 3.4 (L) 3.5 - 5.1 mmol/L   Chloride 104 98 - 111 mmol/L   CO2 22 22 - 32 mmol/L   Glucose, Bld 128 (H) 70 - 99 mg/dL   BUN 5 (L) 6 - 20 mg/dL   Creatinine, Ser 9.28 0.44 - 1.00 mg/dL   Calcium  7.9 (L) 8.9 - 10.3 mg/dL   GFR, Estimated >39 >39 mL/min   Anion gap 10 5 - 15  Glucose, capillary   Collection Time: 11/11/23  5:55 AM  Result Value Ref Range   Glucose-Capillary 134 (H) 70 - 99 mg/dL  Glucose, capillary   Collection Time: 11/11/23  7:04 AM  Result Value Ref Range   Glucose-Capillary 105 (H) 70 - 99 mg/dL    Imaging Studies:    No results found.   Medications:  Scheduled  amitriptyline   25 mg Oral QHS   aspirin  EC  81 mg Oral Daily   citalopram   20 mg Oral QHS   docusate sodium  100 mg Oral Daily   famotidine   20 mg Oral BID   fluticasone   2 spray Each Nare Daily   [START ON 11/12/2023] levothyroxine   200 mcg Oral Q0600   melatonin  10 mg Oral QHS   oxytocin 40 units in LR 1000 mL  333 mL Intravenous Once   potassium chloride   20 mEq Oral Daily   prenatal multivitamin  1 tablet Oral Q1200   I have reviewed the patient's current medications.  ASSESSMENT: G2P0010 [redacted]w[redacted]d Estimated Date of Delivery: 12/18/23   -Type 1 DM- admitted for hyperglycemia/DKA - Postsurgical hypothyroid and hypoparathyroid - Major depressive disorder - Obesity   PLAN: 1) Type 1 DM - Currently on Endo tool will continue IV insulin  drip and titration as needed - Diabetic coordinator - Improvement in anion gap and labs, will continue to check  daily - Potassium supplement ordered  2) Fetal well being -BPP 8/8 on 10/8 (yesterday) - Continue every shift monitoring, reactive NST - Last growth on 9/25 @ [redacted]w[redacted]d: 2230 g, 77th percentile  3) Maternal care -routine OB care -continue home medication including synthroid   DISP: Continue inpatient management as above.  Pending glucose control will discuss with MFM regarding timing of delivery  Mikaelah Trostle M Sufyan Meidinger 11/11/2023,7:24 AM

## 2023-11-11 NOTE — Progress Notes (Signed)
 RN spoke with MD and confirmed and adjusted orders regarding POCT monitoring. Verbal orders received for Fasting, 2hr post prandial, and HS.

## 2023-11-12 ENCOUNTER — Encounter: Admitting: Physical Therapy

## 2023-11-12 ENCOUNTER — Other Ambulatory Visit

## 2023-11-12 DIAGNOSIS — E108 Type 1 diabetes mellitus with unspecified complications: Secondary | ICD-10-CM

## 2023-11-12 DIAGNOSIS — O24013 Pre-existing diabetes mellitus, type 1, in pregnancy, third trimester: Secondary | ICD-10-CM | POA: Diagnosis not present

## 2023-11-12 DIAGNOSIS — Z3A34 34 weeks gestation of pregnancy: Secondary | ICD-10-CM | POA: Diagnosis not present

## 2023-11-12 LAB — BASIC METABOLIC PANEL WITH GFR
Anion gap: 13 (ref 5–15)
BUN: <5 mg/dL — ABNORMAL LOW (ref 6–20)
CO2: 22 mmol/L (ref 22–32)
Calcium: 7.7 mg/dL — ABNORMAL LOW (ref 8.9–10.3)
Chloride: 101 mmol/L (ref 98–111)
Creatinine, Ser: 0.82 mg/dL (ref 0.44–1.00)
GFR, Estimated: >60 mL/min (ref >60)
Glucose, Bld: 266 mg/dL — ABNORMAL HIGH (ref 70–99)
Potassium: 3.8 mmol/L (ref 3.5–5.1)
Sodium: 136 mmol/L (ref 135–145)

## 2023-11-12 LAB — GLUCOSE, CAPILLARY
Glucose-Capillary: 226 mg/dL — ABNORMAL HIGH (ref 70–99)
Glucose-Capillary: 163 mg/dL — ABNORMAL HIGH (ref 70–99)
Glucose-Capillary: 240 mg/dL — ABNORMAL HIGH (ref 70–99)

## 2023-11-12 MED ORDER — INSULIN ASPART 100 UNIT/ML IJ SOLN
28.0000 [IU] | Freq: Three times a day (TID) | INTRAMUSCULAR | Status: DC
  Administered 2023-11-12 (×3): 28 [IU] via SUBCUTANEOUS

## 2023-11-12 MED ORDER — INSULIN ASPART 100 UNIT/ML IJ SOLN
0.0000 [IU] | Freq: Three times a day (TID) | INTRAMUSCULAR | Status: DC
  Administered 2023-11-12: 3 [IU] via SUBCUTANEOUS
  Administered 2023-11-12 – 2023-11-13 (×4): 4 [IU] via SUBCUTANEOUS
  Administered 2023-11-13: 3 [IU] via SUBCUTANEOUS
  Administered 2023-11-14 (×2): 4 [IU] via SUBCUTANEOUS
  Administered 2023-11-14: 6 [IU] via SUBCUTANEOUS
  Administered 2023-11-15 (×2): 4 [IU] via SUBCUTANEOUS

## 2023-11-12 MED ORDER — INSULIN GLARGINE 100 UNIT/ML ~~LOC~~ SOLN
70.0000 [IU] | SUBCUTANEOUS | Status: DC
  Administered 2023-11-12: 70 [IU] via SUBCUTANEOUS
  Filled 2023-11-12 (×2): qty 0.7

## 2023-11-12 NOTE — Progress Notes (Signed)
 FACULTY PRACTICE ANTEPARTUM(COMPREHENSIVE) NOTE  Tariyah Nyazia Canevari is a 23 y.o. G2P0010 with Estimated Date of Delivery: 12/18/23   By  early ultrasound [redacted]w[redacted]d  who is admitted for hyperglycemia- Type 1 DM.    Fetal presentation is cephalic. Length of Stay:  2  Days  Date of admission:11/10/2023  Subjective: Resting comfortably this am, no acute complaints.  No nausea/vomiting.  No headache or blurry vision. Patient reports the fetal movement as active. Patient reports uterine contraction  activity as none. Patient reports  vaginal bleeding as none. Patient describes fluid per vagina as None.  Vitals:  Blood pressure (!) 109/54, pulse 83, temperature 97.9 F (36.6 C), temperature source Oral, resp. rate 16, last menstrual period 03/15/2023, SpO2 97%. Vitals:   11/11/23 1300 11/11/23 1800 11/11/23 1934 11/12/23 0421  BP: 129/70 122/68 112/71 (!) 109/54  Pulse: 100 90 97 83  Resp: 18 18 17 16   Temp: 97.8 F (36.6 C) 97.7 F (36.5 C) 98.1 F (36.7 C) 97.9 F (36.6 C)  TempSrc: Oral Oral Oral Oral  SpO2: 99% 100% 99% 97%   Physical Examination:  General appearance - alert, well appearing, and in no distress Mental status - normal mood, behavior, speech, dress, motor activity, and thought processes Chest - CTAB Heart - normal rate and regular rhythm Abdomen - obese, gravid, soft and non-tender Musculoskeletal - no calf tenderness bilaterally Extremities - minimal edema Skin - warm and dry   Fetal Monitoring:  Baseline: 140 bpm, Variability: moderate, Accelerations: +15x15 x 2, and Decelerations: Absent    reactive  Labs:  Results for orders placed or performed during the hospital encounter of 11/10/23 (from the past 24 hours)  Glucose, capillary   Collection Time: 11/11/23  7:04 AM  Result Value Ref Range   Glucose-Capillary 105 (H) 70 - 99 mg/dL  Glucose, capillary   Collection Time: 11/11/23  8:11 AM  Result Value Ref Range   Glucose-Capillary 184 (H) 70 - 99  mg/dL  Glucose, capillary   Collection Time: 11/11/23  7:34 PM  Result Value Ref Range   Glucose-Capillary 212 (H) 70 - 99 mg/dL  Basic metabolic panel   Collection Time: 11/12/23  5:24 AM  Result Value Ref Range   Sodium 136 135 - 145 mmol/L   Potassium 3.8 3.5 - 5.1 mmol/L   Chloride 101 98 - 111 mmol/L   CO2 22 22 - 32 mmol/L   Glucose, Bld 266 (H) 70 - 99 mg/dL   BUN <5 (L) 6 - 20 mg/dL   Creatinine, Ser 9.17 0.44 - 1.00 mg/dL   Calcium  7.7 (L) 8.9 - 10.3 mg/dL   GFR, Estimated >39 >39 mL/min   Anion gap 13 5 - 15    Imaging Studies:    US  MFM Fetal BPP Wo Non Stress Result Date: 11/11/2023 ----------------------------------------------------------------------  OBSTETRICS REPORT                       (Signed Final 11/11/2023 11:20 am) ---------------------------------------------------------------------- Patient Info  ID #:       [id number]                          D.O.B.:  10-17-2000 (23 yrs)(F)  Name:       ALOIS ALMARIE               Visit Date: 11/10/2023 10:30 pm              [place] ----------------------------------------------------------------------  Performed By  Attending:        Delora Smaller DO       Ref. Address:     [address] [place]  Performed By:     Mardy Heller          [facility]  Referred By:      RANEE Lofts ---------------------------------------------------------------------- Orders  #  Description                           Code        Ordered By  1  US  MFM FETAL BPP WO NON               76819.01    CAROLINE NEILL     STRESS ----------------------------------------------------------------------  #  Order #                     Accession #                Episode #  1  [id number]                   [id number]                 [id number]  ---------------------------------------------------------------------- Indications  Decreased fetal movement                       O36.8190  Pre-existing diabetes, type 1, in pregnancy,   O24.013  third trimester  Hypertension - Chronic/Pre-existing (no        O10.019  meds)  Obesity complicating pregnancy, third          O99.213  trimester  Rh negative state in antepartum                O36.0190  Neg Horizon/AFP  Abnormal finding on antenatal screening        O28.9  (low FF - no redraw per [facility])  [redacted] weeks gestation of pregnancy                Z3A.34 ---------------------------------------------------------------------- Fetal Evaluation  Num Of Fetuses:         1  Fetal Heart Rate(bpm):  154  Cardiac Activity:       Observed  Presentation:           Cephalic  Placenta:               Anterior  Amniotic Fluid  AFI FV:      Within normal limits  AFI Sum(cm)     %Tile       Largest Pocket(cm)  19.2            71  6.3  RUQ(cm)       RLQ(cm)       LUQ(cm)        LLQ(cm)  6.3           4.7           4.1            4.1 ---------------------------------------------------------------------- Biophysical Evaluation  Amniotic F.V:   Within normal limits       F. Tone:        Observed  F. Movement:    Observed                   Score:          8/8  F. Breathing:   Observed ---------------------------------------------------------------------- OB History  Gravidity:    2         Term:   0        Prem:   0        SAB:   1  TOP:          0       Ectopic:  0        Living: 0 ---------------------------------------------------------------------- Gestational Age  LMP:           34w 2d        Date:  03/15/23                  EDD:   12/20/23  Best:          34w 2d     Det. By:  LMP  (03/15/23)          EDD:   12/20/23 ---------------------------------------------------------------------- Comments  Hospital Ultrasound  The patient is admitted for glucose management. A BPP was  performed. Blood glucose levels are mostly below 200  since  this am.  Sonographic findings  Single intrauterine pregnancy at 34w 2d  Fetal cardiac activity: Observed and appears normal.  Presentation: Cephalic.  Limited fetal anatomy appears normal.  Amniotic fluid volume: Within normal limits. MVP: 6.3 cm.  Placenta: Anterior.  BPP: 8/8.  Recommendations  - Continue clinical management per OB provider.  - Continue to improve glucose control.  - Delivery timing will be based on clinical picture but no later  than 37 weeks.  This was a limited ultrasound with a remote read. If an official  MFM consult is requested for any reason please call/place an  order in Epic. ----------------------------------------------------------------------                  Delora Smaller, DO Electronically Signed Final Report   11/11/2023 11:20 am ----------------------------------------------------------------------     Medications:  Scheduled  amitriptyline   25 mg Oral QHS   aspirin  EC  81 mg Oral Daily   citalopram   20 mg Oral QHS   docusate sodium  100 mg Oral Daily   famotidine   20 mg Oral BID   fluticasone   2 spray Each Nare Daily   insulin  aspart  0-15 Units Subcutaneous TID PC   insulin  aspart  0-5 Units Subcutaneous QHS   insulin  aspart  25 Units Subcutaneous TID WC   insulin  glargine  60 Units Subcutaneous Q24H   levothyroxine   200 mcg Oral Q0600   melatonin  10 mg Oral QHS   potassium chloride   20 mEq Oral Daily   Prenatal Multi +DHA  1 capsule Oral QHS   I have reviewed the patient's current medications.  ASSESSMENT: G2P0010 [redacted]w[redacted]d Estimated Date of Delivery:  12/18/23  -Type 1 DM- admitted for hyperglycemia/DKA - Postsurgical hypothyroid and hypoparathyroid - Major depressive disorder - Obesity    PLAN: 1) Type 1 DM -s/p insulin  drip- transitioned to Lantus  60units daily and Novolog  25units with meals -will continue to titrate as needed -appreciated recs per DM coordinator -BMP stable  2) Fetal well being -Reactive NST -continue q shift  monitoring  3) Maternal care -routine OB care -continue home medication   DISP: Based on continued improvement of sugars anticipate pt may likely be able to go home and continue with plan for delivery @ 36wks.  Delon HERO Santos Sollenberger 11/12/2023,6:32 AM

## 2023-11-12 NOTE — Plan of Care (Signed)
  Problem: Education: Goal: Knowledge of disease or condition will improve Outcome: Progressing Goal: Knowledge of the prescribed therapeutic regimen will improve Outcome: Progressing Goal: Individualized Educational Video(s) Outcome: Progressing   Problem: Clinical Measurements: Goal: Complications related to the disease process, condition or treatment will be avoided or minimized Outcome: Progressing   Problem: Education: Goal: Knowledge of General Education information will improve Description: Including pain rating scale, medication(s)/side effects and non-pharmacologic comfort measures Outcome: Progressing   Problem: Health Behavior/Discharge Planning: Goal: Ability to manage health-related needs will improve Outcome: Progressing   Problem: Clinical Measurements: Goal: Ability to maintain clinical measurements within normal limits will improve Outcome: Progressing Goal: Will remain free from infection Outcome: Progressing Goal: Diagnostic test results will improve Outcome: Progressing Goal: Respiratory complications will improve Outcome: Progressing Goal: Cardiovascular complication will be avoided Outcome: Progressing   Problem: Activity: Goal: Risk for activity intolerance will decrease Outcome: Progressing   Problem: Nutrition: Goal: Adequate nutrition will be maintained Outcome: Progressing   Problem: Coping: Goal: Level of anxiety will decrease Outcome: Progressing   Problem: Elimination: Goal: Will not experience complications related to bowel motility Outcome: Progressing Goal: Will not experience complications related to urinary retention Outcome: Progressing   Problem: Pain Managment: Goal: General experience of comfort will improve and/or be controlled Outcome: Progressing   Problem: Safety: Goal: Ability to remain free from injury will improve Outcome: Progressing   Problem: Skin Integrity: Goal: Risk for impaired skin integrity will  decrease Outcome: Progressing   Problem: Education: Goal: Ability to describe self-care measures that may prevent or decrease complications (Diabetes Survival Skills Education) will improve Outcome: Progressing Goal: Individualized Educational Video(s) Outcome: Progressing   Problem: Coping: Goal: Ability to adjust to condition or change in health will improve Outcome: Progressing   Problem: Fluid Volume: Goal: Ability to maintain a balanced intake and output will improve Outcome: Progressing   Problem: Health Behavior/Discharge Planning: Goal: Ability to identify and utilize available resources and services will improve Outcome: Progressing Goal: Ability to manage health-related needs will improve Outcome: Progressing   Problem: Metabolic: Goal: Ability to maintain appropriate glucose levels will improve Outcome: Progressing   Problem: Nutritional: Goal: Maintenance of adequate nutrition will improve Outcome: Progressing Goal: Progress toward achieving an optimal weight will improve Outcome: Progressing   Problem: Skin Integrity: Goal: Risk for impaired skin integrity will decrease Outcome: Progressing   Problem: Tissue Perfusion: Goal: Adequacy of tissue perfusion will improve Outcome: Progressing

## 2023-11-12 NOTE — Consult Note (Addendum)
 MFM Inpatient Consult Note Patient Name: Cassandra [name]  Patient MRN:   [id number]  Referring provider: Dr. Alger  Reason for Consult: Diabetic control, delivery timing  HPI: Cassandra [name] is a 23 y.o. G2P0010 at [redacted]w[redacted]d admitted diabetic control.  She is currently monitored with a Dexcom as well as fingersticks.  Her Dexcom shows that she at goal less than 30% of the time.  The majority of her blood sugars on the Dexcom are in the 100s up to the 300s.  Her fingersticks range from approximately 150-200.  This could represent capillary glucose volatility and while the fingersticks do not show elevated blood sugars in the 300 range she is still experiencing blood sugars higher than her fingersticks based on the timing of assessment.  This confers an increased risk for the mother and fetus.  At this time there is no evidence of DKA since her ketones are normal.  Her electrolytes have also been normal.  Her last growth ultrasound was on 9/25 and reflect 77 percentile which is normal.  She will likely not need another growth ultrasound this pregnancy.  I discussed the patient's case with her OB provider Delon Rash who manages her as an outpatient.  We both believe that inpatient management would be best to achieve a better level of glycemic control prior to delivery.  This will decrease the complications to the newborn such as temperature instability, glucose abnormalities, electrolyte instability and other complications of babies born with abnormal blood sugars.  Dietary discretion seems to be a component causing the blood sugars to be abnormal and inpatient management will help achieve a better glucose level since she can more rapidly adjust her medication based on the recommendations provided by the diabetes team.   If the patient is able to achieve > 24h with at least 70-75% in range values of glucose, then we may be able to discharge her. However, it is more likely that the patient  will remain inpatient throughout the rest of her pregnancy.  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/25/2019   S/P thyroidectomy 01/27/2019  Symptomatic mammary hypertrophy 03/17/2017   Thyroid  goiter 05/09/2018   Type 1 diabetes, uncontrolled, 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      Past Surgical History:    Past Surgical History:  Procedure Laterality Date   ADENOIDECTOMY     CHOLECYSTECTOMY     COLONOSCOPY     ELPP Surgery     ESOPHAGOGASTRODUODENOSCOPY     SPHINCTEROTOMY  03/16/2023   THYROIDECTOMY N/A 01/25/2019   Procedure: COMPLETE THYROIDECTOMY;  Surgeon: Karis Clunes, MD;  Location: [facility] OR;  Service: ENT;  Laterality: N/A;   TOE SURGERY Bilateral    for ingrown toenails   TYMPANOSTOMY TUBE PLACEMENT     WISDOM TOOTH EXTRACTION        Family History:   family history includes ADD / ADHD in her maternal grandmother; Anxiety disorder in her maternal grandmother and mother; Cancer in her maternal grandfather; Diabetes in her maternal grandmother; Hyperlipidemia in her maternal grandfather, mother, and another family member; Hypertension in her maternal grandmother; Hypothyroidism in her maternal grandfather.   Social History:   Social History   Socioeconomic History   Marital status: Single    Spouse name: Not on file   Number of children: Not on file   Years of education: 12   Highest education level: High school graduate  Occupational History   Not on file  Tobacco Use   Smoking status: Former    Current packs/day: 0.00    Average packs/day: 0.1 packs/day for 5.0 years (0.7 ttl pk-yrs)    Types: Cigarettes    Start date: 01/22/2014    Quit date: 01/23/2019    Years since quitting: 4.8    Passive exposure: Current (mom smokes)   Smokeless tobacco: Never  Vaping Use   Vaping status: Former  Substance and Sexual Activity   Alcohol use: No   Drug use: No   Sexual activity: Yes    Birth control/protection: None  Other Topics Concern   Not on file  Social History Narrative   Pt lives with mother. Mother is a Midwife.  They have two dogs and three cats. Patient is graduate from [employer]; Pt enjoys softball, sleep, and be with family.    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    Social Drivers of Health   Financial Resource Strain: Low Risk  (08/16/2023)   Received from [facility]   Overall Financial Resource Strain (CARDIA)    How hard is it for you to pay for the very basics like food, housing, medical care, and heating?: Not very hard  Food Insecurity: No Food Insecurity (11/11/2023)   Hunger Vital Sign    Worried About Running Out of Food in the Last Year: Never true    Ran Out of Food in the Last Year: Never true  Transportation  Needs: No Transportation Needs (11/11/2023)   PRAPARE - Administrator, Civil Service (Medical): No    Lack of Transportation (Non-Medical): No  Physical Activity: Insufficiently Active (08/16/2023)   Received from [facility]   Exercise Vital Sign    On average, how many days per week do you engage in moderate to strenuous exercise (like a brisk walk)?: 3 days    On average, how many minutes do you engage in exercise at this level?: 30 min  Stress: No Stress Concern Present (08/16/2023)   Received from [facility] - Occupational Stress Questionnaire  Do you feel stress - tense, restless, nervous, or anxious, or unable to sleep at night because your mind is troubled all the time - these days?: Only a little  Social Connections: Moderately Integrated (08/16/2023)   Received from [facility]   Social Network    How would you rate your social network (family, work, friends)?: Adequate participation with social networks  Intimate Partner Violence: Not At Risk (11/11/2023)   Humiliation, Afraid, Rape, and Kick questionnaire    Fear of Current or Ex-Partner: No    Emotionally Abused: No    Physically Abused: No    Sexually Abused: No      Home Medications:   No current facility-administered medications on file prior to encounter.   Current Outpatient Medications on File Prior to Encounter  Medication Sig Dispense Refill   amitriptyline  (ELAVIL ) 75 MG tablet Take 1 tablet (75 mg total) by mouth at bedtime. (Patient taking differently: Take 25 mg by mouth at bedtime.) 90 tablet 1   aspirin  EC 81 MG tablet Take 1 tablet (81 mg total) by mouth daily. Swallow whole. 30 tablet 12   calcitRIOL  (ROCALTROL ) 0.5 MCG capsule Take 0.5 mcg by mouth daily.     calcium  carbonate (OSCAL) 1500 (600 Ca) MG TABS tablet Take 1,500 mg by mouth in the morning, at noon, and at bedtime.     citalopram  (CELEXA ) 10 MG tablet Take 10 mg by mouth daily.      famotidine  (PEPCID ) 20 MG tablet Take 1 tablet (20 mg total) by mouth 2 (two) times daily. 60 tablet 3   fluticasone  (FLONASE ) 50 MCG/ACT nasal spray Place 2 sprays into both nostrils daily. 1 g 3   insulin  lispro (HUMALOG  KWIKPEN) 100 UNIT/ML KwikPen Inject 20 Units into the skin 3 (three) times daily. 18 mL 5   insulin  lispro (HUMALOG ) 100 UNIT/ML injection INJECT UP TO 300 UNITS IN INSULIN  PUMP EVERY 48 HOURS, PER DKA AND HYPERGLYCEMIA PROTOCOLS 40 mL 5   levothyroxine  (SYNTHROID ) 200 MCG tablet Take 200 mcg by mouth daily.     Melatonin 1 MG CAPS Take 10 mg by mouth at bedtime.     nystatin  powder Apply 1 Application topically 3 (three) times daily. 15 g 2   Prenatal Vit-Fe Fumarate-FA (MULTIVITAMIN-PRENATAL) 27-0.8 MG TABS tablet Take 1 tablet by mouth daily at 12 noon.     Blood Glucose Monitoring Suppl (ACCU-CHEK GUIDE) w/Device KIT 1 kit by Does not apply route daily as needed. 2 kit 5   Continuous Blood Gluc Receiver (DEXCOM G6 RECEIVER) DEVI 1 Device by Does not apply route daily as needed. 1 each 0   Continuous Blood Gluc Sensor (DEXCOM G6 SENSOR) MISC CHANGE SENSOR EVERY 10 DAYS AS DIRECTED. 3 each 5   Continuous Blood Gluc Transmit (DEXCOM G6 TRANSMITTER) MISC 1 kit by Does not apply route daily as needed. 1 each 1   Glucagon  (BAQSIMI  TWO PACK) 3 MG/DOSE POWD Place 1 each into the nose as needed (severe hypoglycmia with unresponsiveness). 1 each 3   Insulin  Syringe-Needle U-100 (INSULIN  SYRINGE .3CC/29GX1/2) 29G X 1/2 0.3 ML MISC 1 each by Does not apply route 3 (three) times daily. 100 each 6   Ostomy Supplies (ADHESIVE REMOVER WIPES) MISC Use as directed for pump and CGM sites. 50 each 3      Allergies:   Allergies  Allergen Reactions   Shellfish Protein-Containing Drug Products Diarrhea and Nausea And Vomiting   Tape Itching    Medical tape causes itching   Amoxicillin-Pot  Clavulanate Nausea And Vomiting and Other (See Comments)   Lactose Intolerance (Gi)    Shellfish  Allergy Nausea And Vomiting and Other (See Comments)   Tilactase Nausea And Vomiting   Latex Rash     Physical Exam:   Vitals:   11/12/23 0421 11/12/23 0940  BP: (!) 109/54 106/62  Pulse: 83 82  Resp: 16 16  Temp: 97.9 F (36.6 C) 98 F (36.7 C)  SpO2: 97% 96%   Sitting comfortably on the hospital bed  Nonlabored breathing Normal rate and rhythm Abdomen is nontender  Assessment  Nakita ELIZABETH Follett is a 23 y.o. G2P0010 at [redacted]w[redacted]d admitted for the following. MFM consult was placed for:   Type 1 diabetes mellitus in pregnancy, third trimester - Plan: CBC, RPR, CBC, RPR, CANCELED: Type and screen, CANCELED: Type and screen  [redacted] weeks gestation of pregnancy  Recommendations - Delon Rasch will attempt to help calibrate the patient's Dexcom - In addition to using the fingerstick values we should attempt to achieve at least 70% of the patient's Dexcom readings at goal.  This will account for glucose volatility - Glucose management team to continue to adjust insulin  - BMP and BHB if blood sugars are > 250 to rule out DKA - Continue dietary discretion to minimize carbohydrates and sugars - Weekly ultrasound to assess amniotic fluid and presentation - Fetal tracings 3 times a day for 1 hour.  Prolonged monitoring of tracings are not reassuring - If the patient is able to achieve > 24h with at least 70-75% in range values of glucose, then we may be able to discharge her. However, it is more likely that the patient will remain inpatient throughout the rest of her pregnancy. - Disposition: continue inpatient care - Delivery likely around 36 weeks, currently scheduled  Approximately 75 minutes was spent in chart review, communication with other providers, patient assessment and education and documentation. Thank you for the opportunity to be involved with this patient's care. Please let us  know if we can be of any further assistance.   Delora Smaller, DO Maternal fetal medicine, [facility]   11/12/2023  10:00 AM

## 2023-11-12 NOTE — Inpatient Diabetes Management (Signed)
 Inpatient Diabetes Program Recommendations  AACE/ADA: New Consensus Statement on Inpatient Glycemic Control   Target Ranges:  Prepandial:   less than 140 mg/dL      Peak postprandial:   less than 180 mg/dL (1-2 hours)      Critically ill patients:  140 - 180 mg/dL    Latest Reference Range & Units 11/11/23 02:45 11/11/23 03:19 11/11/23 03:51 11/11/23 04:23 11/11/23 04:53 11/11/23 05:55 11/11/23 07:04 11/11/23 08:11 11/11/23 19:34  Glucose-Capillary 70 - 99 mg/dL 796 (H) 798 (H) 814 (H) 176 (H) 158 (H) 134 (H) 105 (H) 184 (H) 212 (H)   Review of Glycemic Control  Diabetes history: DM1; [redacted]W[redacted]D Outpatient Diabetes medications: T-Slim pump with Humalog , plus Humalog  0-20 units TID Current orders for Inpatient glycemic control: Lantus  60 units Q24H, Novolog  25 units TID with meals, Novolog  0-15 units TID with meals, Novolog  0-5 units QHS  Inpatient Diabetes Program Recommendations:    Insulin : Please increase Lantus  to 70 units Q24H and meal coverage to Novolog  28 units TID with meals. Please discontinue Novolog  0-15 units TID and Novolog  0-5 units at bedtime and use Diabetes Treatment for Pregnant/Postpartum Patient order set to order Novolog  0-14 units TID (2 hour post prandial).  Thanks, Earnie Gainer, RN, MSN, CDCES Diabetes Coordinator Inpatient Diabetes Program [phone] (Team Pager from 8am to 5pm)

## 2023-11-13 LAB — GLUCOSE, CAPILLARY
Glucose-Capillary: 195 mg/dL — ABNORMAL HIGH (ref 70–99)
Glucose-Capillary: 117 mg/dL — ABNORMAL HIGH (ref 70–99)
Glucose-Capillary: 131 mg/dL — ABNORMAL HIGH (ref 70–99)

## 2023-11-13 MED ORDER — ACETAMINOPHEN 500 MG PO TABS
1000.0000 mg | ORAL_TABLET | Freq: Four times a day (QID) | ORAL | Status: DC | PRN
  Administered 2023-11-13: 1000 mg via ORAL
  Filled 2023-11-13: qty 2

## 2023-11-13 MED ORDER — INSULIN ASPART 100 UNIT/ML IJ SOLN
36.0000 [IU] | Freq: Three times a day (TID) | INTRAMUSCULAR | Status: DC
  Administered 2023-11-13 – 2023-11-15 (×6): 36 [IU] via SUBCUTANEOUS

## 2023-11-13 MED ORDER — INSULIN GLARGINE 100 UNIT/ML ~~LOC~~ SOLN
84.0000 [IU] | SUBCUTANEOUS | Status: DC
  Administered 2023-11-13: 84 [IU] via SUBCUTANEOUS
  Filled 2023-11-13 (×2): qty 0.84

## 2023-11-13 MED ORDER — ONDANSETRON 4 MG PO TBDP
4.0000 mg | ORAL_TABLET | Freq: Four times a day (QID) | ORAL | Status: DC | PRN
  Administered 2023-11-13: 4 mg via ORAL
  Filled 2023-11-13: qty 1

## 2023-11-13 MED ORDER — PROMETHAZINE HCL 25 MG PO TABS
25.0000 mg | ORAL_TABLET | Freq: Four times a day (QID) | ORAL | Status: DC | PRN
  Administered 2023-11-16: 25 mg via ORAL
  Filled 2023-11-13 (×2): qty 1

## 2023-11-13 NOTE — Progress Notes (Signed)
 Patient ID: Cassandra [name] Cancer, female   DOB: [birth year], 23 y.o.   MRN: [id number] FACULTY PRACTICE ANTEPARTUM(COMPREHENSIVE) NOTE  Cassandra [name] is a 23 y.o. G2P0010 with Estimated Date of Delivery: 12/18/23   By  early ultrasound [redacted]w[redacted]d  who is admitted for poor glycemic control.    Fetal presentation is cephalic. Length of Stay:  3  Days  Date of admission:11/10/2023  Subjective: No complaints Patient reports the fetal movement as active. Patient reports uterine contraction  activity as none. Patient reports  vaginal bleeding as none. Patient describes fluid per vagina as None.  Vitals:  Blood pressure (!) 114/57, pulse 88, temperature 97.8 F (36.6 C), temperature source Oral, resp. rate 16, last menstrual period 03/15/2023, SpO2 98%. Vitals:   11/12/23 1641 11/12/23 2029 11/13/23 0048 11/13/23 0805  BP: 120/68 125/76 117/63 (!) 114/57  Pulse: 90 87 78 88  Resp: 18 17 16 16   Temp: 98.1 F (36.7 C) 97.9 F (36.6 C) 97.9 F (36.6 C) 97.8 F (36.6 C)  TempSrc: Oral Oral Oral Oral  SpO2: 98% 98% 96% 98%   Physical Examination:  General appearance - alert, well appearing, and in no distress Abdomen - soft, nontender, nondistended, no masses or organomegaly Fundal Height:  size equals dates Pelvic Exam:  examination not indicated Cervical Exam: Not evaluated.  Extremities: extremities normal, atraumatic, no cyanosis or edema with DTRs 2+ bilaterally Membranes:intact  Fetal Monitoring:  Baseline: 140s bpm, Variability: Good {> 6 bpm), Accelerations: Reactive, and Decelerations: Absent   reactive  Labs:  Results for orders placed or performed during the hospital encounter of 11/10/23 (from the past 24 hours)  Glucose, capillary   Collection Time: 11/12/23 12:19 PM  Result Value Ref Range   Glucose-Capillary 226 (H) 70 - 99 mg/dL  Glucose, capillary   Collection Time: 11/12/23  3:37 PM  Result Value Ref Range   Glucose-Capillary 163 (H) 70 - 99 mg/dL  Glucose,  capillary   Collection Time: 11/12/23  9:42 PM  Result Value Ref Range   Glucose-Capillary 240 (H) 70 - 99 mg/dL  Glucose, capillary   Collection Time: 11/13/23  8:02 AM  Result Value Ref Range   Glucose-Capillary 195 (H) 70 - 99 mg/dL    Imaging Studies:    US  MFM Fetal BPP Wo Non Stress Result Date: 11/11/2023 ----------------------------------------------------------------------  OBSTETRICS REPORT                       (Signed Final 11/11/2023 11:20 am) ---------------------------------------------------------------------- Patient Info  ID #:       [id number]                          D.O.B.:  September 28, 2000 (23 yrs)(F)  Name:       Cassandra [name]               Visit Date: 11/10/2023 10:30 pm              [place] ---------------------------------------------------------------------- Performed By  Attending:        Delora Smaller DO       Ref. Address:     [address]  [place], [place]  Performed By:     Mardy Heller          [facility]  Referred By:      RANEE [place] ---------------------------------------------------------------------- Orders  #  Description                           Code        Ordered By  1  US  MFM FETAL BPP WO NON               76819.01    CAROLINE NEILL     STRESS ----------------------------------------------------------------------  #  Order #                     Accession #                Episode #  1  [id number]                   [id number]                 [id number] ---------------------------------------------------------------------- Indications  Decreased fetal movement                       O36.8190  Pre-existing diabetes, type 1, in pregnancy,   O24.013  third trimester  Hypertension - Chronic/Pre-existing (no        O10.019  meds)  Obesity complicating pregnancy, third          O99.213  trimester  Rh negative state  in antepartum                O36.0190  Neg Horizon/AFP  Abnormal finding on antenatal screening        O28.9  (low FF - no redraw per [facility])  [redacted] weeks gestation of pregnancy                Z3A.34 ---------------------------------------------------------------------- Fetal Evaluation  Num Of Fetuses:         1  Fetal Heart Rate(bpm):  154  Cardiac Activity:       Observed  Presentation:           Cephalic  Placenta:               Anterior  Amniotic Fluid  AFI FV:      Within normal limits  AFI Sum(cm)     %Tile       Largest Pocket(cm)  19.2            71          6.3  RUQ(cm)       RLQ(cm)       LUQ(cm)        LLQ(cm)  6.3           4.7           4.1            4.1 ---------------------------------------------------------------------- Biophysical Evaluation  Amniotic F.V:   Within normal limits       F. Tone:  Observed  F. Movement:    Observed                   Score:          8/8  F. Breathing:   Observed ---------------------------------------------------------------------- OB History  Gravidity:    2         Term:   0        Prem:   0        SAB:   1  TOP:          0       Ectopic:  0        Living: 0 ---------------------------------------------------------------------- Gestational Age  LMP:           34w 2d        Date:  03/15/23                  EDD:   12/20/23  Best:          34w 2d     Det. By:  LMP  (03/15/23)          EDD:   12/20/23 ---------------------------------------------------------------------- Comments  Hospital Ultrasound  The patient is admitted for glucose management. A BPP was  performed. Blood glucose levels are mostly below 200 since  this am.  Sonographic findings  Single intrauterine pregnancy at 34w 2d  Fetal cardiac activity: Observed and appears normal.  Presentation: Cephalic.  Limited fetal anatomy appears normal.  Amniotic fluid volume: Within normal limits. MVP: 6.3 cm.  Placenta: Anterior.  BPP: 8/8.  Recommendations  - Continue clinical management per OB provider.  -  Continue to improve glucose control.  - Delivery timing will be based on clinical picture but no later  than 37 weeks.  This was a limited ultrasound with a remote read. If an official  MFM consult is requested for any reason please call/place an  order in Epic. ----------------------------------------------------------------------                  Delora Smaller, DO Electronically Signed Final Report   11/11/2023 11:20 am ----------------------------------------------------------------------     Medications:  Scheduled  amitriptyline   25 mg Oral QHS   aspirin  EC  81 mg Oral Daily   citalopram   20 mg Oral QHS   docusate sodium  100 mg Oral Daily   famotidine   20 mg Oral BID   fluticasone   2 spray Each Nare Daily   insulin  aspart  0-14 Units Subcutaneous TID PC   insulin  aspart  36 Units Subcutaneous TID WC   insulin  glargine  84 Units Subcutaneous Q24H   levothyroxine   200 mcg Oral Q0600   melatonin  10 mg Oral QHS   potassium chloride   20 mEq Oral Daily   Prenatal Multi +DHA  1 capsule Oral QHS   I have reviewed the patient's current medications.  ASSESSMENT: G2P0010 [redacted]w[redacted]d Estimated Date of Delivery: 12/18/23  Type 1 DM, brittle, poor control  PLAN: 1) Type 1 DM -s/p insulin  drip- transitioned to Lantus  84 units daily and Novolog  36 units with meals -will continue to titrate as needed -appreciated recs per DM coordinator -BMP stable   2) Fetal well being -Reactive NST -continue q shift monitoring   3) Maternal care -routine OB care -continue home medication     DISP: at present I doubt her care will allow discharge and not sure will make it to scheduled IOL, efforting ^20% insulin  today  Vonn VEAR Inch  11/13/2023,8:43 AM

## 2023-11-14 DIAGNOSIS — Z3A35 35 weeks gestation of pregnancy: Secondary | ICD-10-CM

## 2023-11-14 LAB — TYPE AND SCREEN
ABO/RH(D): A NEG
Antibody Screen: POSITIVE

## 2023-11-14 LAB — GLUCOSE, CAPILLARY
Glucose-Capillary: 240 mg/dL — ABNORMAL HIGH (ref 70–99)
Glucose-Capillary: 69 mg/dL — ABNORMAL LOW (ref 70–99)
Glucose-Capillary: 114 mg/dL — ABNORMAL HIGH (ref 70–99)
Glucose-Capillary: 205 mg/dL — ABNORMAL HIGH (ref 70–99)

## 2023-11-14 MED ORDER — NIFEDIPINE 10 MG PO CAPS
10.0000 mg | ORAL_CAPSULE | Freq: Once | ORAL | Status: AC
  Administered 2023-11-14: 10 mg via ORAL
  Filled 2023-11-14: qty 1

## 2023-11-14 MED ORDER — INSULIN GLARGINE 100 UNIT/ML ~~LOC~~ SOLN
88.0000 [IU] | SUBCUTANEOUS | Status: DC
  Administered 2023-11-14 – 2023-11-15 (×2): 88 [IU] via SUBCUTANEOUS
  Filled 2023-11-14 (×2): qty 0.88

## 2023-11-14 NOTE — Plan of Care (Signed)
  Problem: Education: Goal: Knowledge of disease or condition will improve Outcome: Progressing Goal: Knowledge of the prescribed therapeutic regimen will improve Outcome: Progressing Goal: Individualized Educational Video(s) Outcome: Progressing   Problem: Clinical Measurements: Goal: Complications related to the disease process, condition or treatment will be avoided or minimized Outcome: Progressing   Problem: Education: Goal: Knowledge of General Education information will improve Description: Including pain rating scale, medication(s)/side effects and non-pharmacologic comfort measures Outcome: Progressing   Problem: Health Behavior/Discharge Planning: Goal: Ability to manage health-related needs will improve Outcome: Progressing   Problem: Clinical Measurements: Goal: Ability to maintain clinical measurements within normal limits will improve Outcome: Progressing Goal: Will remain free from infection Outcome: Progressing Goal: Diagnostic test results will improve Outcome: Progressing Goal: Respiratory complications will improve Outcome: Progressing Goal: Cardiovascular complication will be avoided Outcome: Progressing   Problem: Activity: Goal: Risk for activity intolerance will decrease Outcome: Progressing   Problem: Nutrition: Goal: Adequate nutrition will be maintained Outcome: Progressing   Problem: Coping: Goal: Level of anxiety will decrease Outcome: Progressing   Problem: Elimination: Goal: Will not experience complications related to bowel motility Outcome: Progressing Goal: Will not experience complications related to urinary retention Outcome: Progressing   Problem: Pain Managment: Goal: General experience of comfort will improve and/or be controlled Outcome: Progressing   Problem: Safety: Goal: Ability to remain free from injury will improve Outcome: Progressing   Problem: Skin Integrity: Goal: Risk for impaired skin integrity will  decrease Outcome: Progressing   Problem: Education: Goal: Ability to describe self-care measures that may prevent or decrease complications (Diabetes Survival Skills Education) will improve Outcome: Progressing Goal: Individualized Educational Video(s) Outcome: Progressing   Problem: Coping: Goal: Ability to adjust to condition or change in health will improve Outcome: Progressing   Problem: Fluid Volume: Goal: Ability to maintain a balanced intake and output will improve Outcome: Progressing   Problem: Health Behavior/Discharge Planning: Goal: Ability to identify and utilize available resources and services will improve Outcome: Progressing Goal: Ability to manage health-related needs will improve Outcome: Progressing   Problem: Metabolic: Goal: Ability to maintain appropriate glucose levels will improve Outcome: Progressing   Problem: Nutritional: Goal: Maintenance of adequate nutrition will improve Outcome: Progressing Goal: Progress toward achieving an optimal weight will improve Outcome: Progressing   Problem: Skin Integrity: Goal: Risk for impaired skin integrity will decrease Outcome: Progressing   Problem: Tissue Perfusion: Goal: Adequacy of tissue perfusion will improve Outcome: Progressing

## 2023-11-14 NOTE — Inpatient Diabetes Management (Signed)
 Inpatient Diabetes Program Recommendations  AACE/ADA: New Consensus Statement on Inpatient Glycemic Control (2015)  Target Ranges:  Prepandial:   less than 140 mg/dL      Peak postprandial:   less than 180 mg/dL (1-2 hours)      Critically ill patients:  140 - 180 mg/dL   Lab Results  Component Value Date   GLUCAP 240 (H) 11/14/2023   HGBA1C 6.7 (H) 09/15/2023    Review of Glycemic Control  Diabetes history: DM1; [redacted]W[redacted]D Outpatient Diabetes medications: T-slim pump with Humalog  0-20 units TID Current orders for Inpatient glycemic control: Lantus  84 units Q24H, Novolog  36 units TID with meals + 0-14 units TID (2H post-prandial)  Inpatient Diabetes Program Recommendations:    Consider increasing Lantus  to 88 units Q24H  Continue to follow daily.  Thank you. Shona Brandy, RD, LDN, CDCES Inpatient Diabetes Coordinator [phone]

## 2023-11-14 NOTE — Progress Notes (Signed)
 Patient ID: Cassandra [name] Cancer, female   DOB: September 29, 2000, 23 y.o.   MRN: [id number] FACULTY PRACTICE ANTEPARTUM(COMPREHENSIVE) NOTE  Cassandra [name] is a 23 y.o. G2P0010 at [redacted]w[redacted]d with T1DM who is admitted for poor glycemic control.    Fetal presentation is cephalic. Length of Stay:  4  Days  Date of admission:11/10/2023  Subjective: Patient is feeling occasional painful contractions this morning. Patient reports the fetal movement as active. Patient reports uterine contraction  activity as above Patient reports  vaginal bleeding as none. Patient describes fluid per vagina as none.  Vitals:  Blood pressure 120/75, pulse 75, temperature 97.8 F (36.6 C), temperature source Oral, resp. rate 16, last menstrual period 03/15/2023, SpO2 97%. Vitals:   11/13/23 1620 11/13/23 1937 11/14/23 0020 11/14/23 0553  BP: 125/67 126/74 123/74 120/75  Pulse: (!) 115 89 79 75  Resp: 16 18 17 16   Temp: 98 F (36.7 C) 97.7 F (36.5 C) 98 F (36.7 C) 97.8 F (36.6 C)  TempSrc: Oral Oral Oral Oral  SpO2: 98% 100% 97% 97%   Physical Examination:  General appearance - alert, well appearing, and in no distress Abdomen - soft, nontender, nondistended, no masses or organomegaly Fundal Height:  size equals dates Pelvic Exam:  examination not indicated Cervical Exam: Dilation: Closed Effacement (%): Thick Station: Ballotable Exam by:: Braxden Lovering MD Done with RN as chaperone Extremities: extremities normal, atraumatic, no cyanosis or edema with DTRs 2+ bilaterally Membranes:intact  Fetal Monitoring:  Baseline: 130s bpm, Variability: Moderate {6-25 bpm), Accelerations: Reactive, and Decelerations: irregular   reactive  Labs:  Results for orders placed or performed during the hospital encounter of 11/10/23 (from the past 24 hours)  Glucose, capillary   Collection Time: 11/13/23  8:02 AM  Result Value Ref Range   Glucose-Capillary 195 (H) 70 - 99 mg/dL  Glucose, capillary   Collection Time:  11/13/23 10:43 PM  Result Value Ref Range   Glucose-Capillary 117 (H) 70 - 99 mg/dL  Glucose, capillary   Collection Time: 11/13/23 11:26 PM  Result Value Ref Range   Glucose-Capillary 131 (H) 70 - 99 mg/dL  Type and screen [facility]   Collection Time: 11/14/23  5:24 AM  Result Value Ref Range   ABO/RH(D) A NEG    Antibody Screen POS    Sample Expiration 11/17/2023,2359    Antibody Identification      PASSIVELY ACQUIRED ANTI-D Performed at [facility], [address], [place], [place] [address]     Imaging Studies:    US  MFM Fetal BPP Wo Non Stress Result Date: 11/11/2023 ----------------------------------------------------------------------  OBSTETRICS REPORT                       (Signed Final 11/11/2023 11:20 am) ---------------------------------------------------------------------- Patient Info  ID #:       [id number]                          D.O.B.:  2001-01-14 (23 yrs)(F)  Name:       Cassandra [name]               Visit Date: 11/10/2023 10:30 pm              [place] ---------------------------------------------------------------------- Performed By  Attending:        Delora Smaller DO       Ref. Address:     [address]  [place], [place]  Performed By:     Mardy Heller          [facility]  Referred By:      RANEE [place] ---------------------------------------------------------------------- Orders  #  Description                           Code        Ordered By  1  US  MFM FETAL BPP WO NON               76819.01    CAROLINE NEILL     STRESS ----------------------------------------------------------------------  #  Order #                     Accession #                Episode #  1  [id number]                   [id number]                 [id number]  ---------------------------------------------------------------------- Indications  Decreased fetal movement                       O36.8190  Pre-existing diabetes, type 1, in pregnancy,   O24.013  third trimester  Hypertension - Chronic/Pre-existing (no        O10.019  meds)  Obesity complicating pregnancy, third          O99.213  trimester  Rh negative state in antepartum                O36.0190  Neg Horizon/AFP  Abnormal finding on antenatal screening        O28.9  (low FF - no redraw per Natera)  [redacted] weeks gestation of pregnancy                Z3A.34 ---------------------------------------------------------------------- Fetal Evaluation  Num Of Fetuses:         1  Fetal Heart Rate(bpm):  154  Cardiac Activity:       Observed  Presentation:           Cephalic  Placenta:               Anterior  Amniotic Fluid  AFI FV:      Within normal limits  AFI Sum(cm)     %Tile       Largest Pocket(cm)  19.2            71          6.3  RUQ(cm)       RLQ(cm)       LUQ(cm)        LLQ(cm)  6.3           4.7           4.1            4.1 ---------------------------------------------------------------------- Biophysical Evaluation  Amniotic F.V:   Within normal limits       F. Tone:  Observed  F. Movement:    Observed                   Score:          8/8  F. Breathing:   Observed ---------------------------------------------------------------------- OB History  Gravidity:    2         Term:   0        Prem:   0        SAB:   1  TOP:          0       Ectopic:  0        Living: 0 ---------------------------------------------------------------------- Gestational Age  LMP:           34w 2d        Date:  03/15/23                  EDD:   12/20/23  Best:          34w 2d     Det. By:  LMP  (03/15/23)          EDD:   12/20/23 ---------------------------------------------------------------------- Comments  Hospital Ultrasound  The patient is admitted for glucose management. A BPP was  performed. Blood glucose levels are mostly below 200  since  this am.  Sonographic findings  Single intrauterine pregnancy at 34w 2d  Fetal cardiac activity: Observed and appears normal.  Presentation: Cephalic.  Limited fetal anatomy appears normal.  Amniotic fluid volume: Within normal limits. MVP: 6.3 cm.  Placenta: Anterior.  BPP: 8/8.  Recommendations  - Continue clinical management per OB provider.  - Continue to improve glucose control.  - Delivery timing will be based on clinical picture but no later  than 37 weeks.  This was a limited ultrasound with a remote read. If an official  MFM consult is requested for any reason please call/place an  order in Epic. ----------------------------------------------------------------------                  Delora Smaller, DO Electronically Signed Final Report   11/11/2023 11:20 am ----------------------------------------------------------------------     Medications:  Scheduled  amitriptyline   25 mg Oral QHS   aspirin  EC  81 mg Oral Daily   citalopram   20 mg Oral QHS   docusate sodium  100 mg Oral Daily   famotidine   20 mg Oral BID   fluticasone   2 spray Each Nare Daily   insulin  aspart  0-14 Units Subcutaneous TID PC   insulin  aspart  36 Units Subcutaneous TID WC   insulin  glargine  84 Units Subcutaneous Q24H   levothyroxine   200 mcg Oral Q0600   melatonin  10 mg Oral QHS   potassium chloride   20 mEq Oral Daily   Prenatal Multi +DHA  1 capsule Oral QHS   I have reviewed the patient's current medications.  ASSESSMENT: G2P0010 [redacted]w[redacted]d Estimated Date of Delivery: 12/18/23  Type 1 DM, brittle, poor control  PLAN: 1) Preterm contractions - No preterm labor - Will give Procardia 10 mg po x 1 to help with symptomatic contractions - Continue close monitoring  1) Type 1 DM -s/p insulin  drip- transitioned to Lantus  84 units daily and Novolog  36 units with meals -will continue to titrate insulin  as needed -follow recommendations of DM coordinator -BMP stable   2) Fetal well being -Reactive  NST -continue NST x 3 daily   3) Maternal care -routine antental care -continue home medication  DISPO: Unsure as her glycemic control is not adequate, she will likely remain inpatient until delivery.   Shakisha Abend 11/14/2023,7:00 AM

## 2023-11-14 NOTE — Progress Notes (Signed)
 Fasting glucose obtained by previous RN via continuous glucose monitor charted in the CGM flowsheet as 210 mg/dL @ 9445.

## 2023-11-14 NOTE — Progress Notes (Signed)
 CBG 69 pt symptomatic. Dr. Eveline made aware. Apple juice given will recheck; will hold insulin  per MD.

## 2023-11-15 LAB — COMPREHENSIVE METABOLIC PANEL WITH GFR
ALT: 14 U/L (ref 0–44)
AST: 18 U/L (ref 15–41)
Albumin: 2.2 g/dL — ABNORMAL LOW (ref 3.5–5.0)
Alkaline Phosphatase: 96 U/L (ref 38–126)
Anion gap: 12 (ref 5–15)
BUN: 6 mg/dL (ref 6–20)
CO2: 21 mmol/L — ABNORMAL LOW (ref 22–32)
Calcium: 7.6 mg/dL — ABNORMAL LOW (ref 8.9–10.3)
Chloride: 103 mmol/L (ref 98–111)
Creatinine, Ser: 0.75 mg/dL (ref 0.44–1.00)
GFR, Estimated: >60 mL/min (ref >60)
Glucose, Bld: 113 mg/dL — ABNORMAL HIGH (ref 70–99)
Potassium: 3.4 mmol/L — ABNORMAL LOW (ref 3.5–5.1)
Sodium: 136 mmol/L (ref 135–145)
Total Bilirubin: 0.4 mg/dL (ref 0.0–1.2)
Total Protein: 6.3 g/dL — ABNORMAL LOW (ref 6.5–8.1)

## 2023-11-15 LAB — CBC
HCT: 30.5 % — ABNORMAL LOW (ref 36.0–46.0)
Hemoglobin: 10 g/dL — ABNORMAL LOW (ref 12.0–15.0)
MCH: 26 pg (ref 26.0–34.0)
MCHC: 32.8 g/dL (ref 30.0–36.0)
MCV: 79.2 fL — ABNORMAL LOW (ref 80.0–100.0)
Platelets: 180 K/uL (ref 150–400)
RBC: 3.85 MIL/uL — ABNORMAL LOW (ref 3.87–5.11)
RDW: 14.6 % (ref 11.5–15.5)
WBC: 11.3 K/uL — ABNORMAL HIGH (ref 4.0–10.5)
nRBC: 0.3 % — ABNORMAL HIGH (ref 0.0–0.2)

## 2023-11-15 LAB — GLUCOSE, CAPILLARY
Glucose-Capillary: 113 mg/dL — ABNORMAL HIGH (ref 70–99)
Glucose-Capillary: 228 mg/dL — ABNORMAL HIGH (ref 70–99)

## 2023-11-15 MED ORDER — INSULIN GLARGINE 100 UNIT/ML ~~LOC~~ SOLN
26.0000 [IU] | Freq: Every day | SUBCUTANEOUS | Status: DC
  Administered 2023-11-15: 26 [IU] via SUBCUTANEOUS
  Filled 2023-11-15 (×2): qty 0.26

## 2023-11-15 MED ORDER — OXYCODONE-ACETAMINOPHEN 5-325 MG PO TABS: 1.0000 | ORAL_TABLET | ORAL | Status: DC | PRN

## 2023-11-15 MED ORDER — OXYTOCIN BOLUS FROM INFUSION
333.0000 mL | Freq: Once | INTRAVENOUS | Status: AC
  Administered 2023-11-18: 333 mL via INTRAVENOUS

## 2023-11-15 MED ORDER — LIDOCAINE HCL (PF) 1 % IJ SOLN: 30.0000 mL | INTRAMUSCULAR | Status: DC | PRN

## 2023-11-15 MED ORDER — SODIUM CHLORIDE 0.9 % IV SOLN
5.0000 10*6.[IU] | Freq: Once | INTRAVENOUS | Status: AC
  Administered 2023-11-15: 5 10*6.[IU] via INTRAVENOUS
  Filled 2023-11-15: qty 5

## 2023-11-15 MED ORDER — LACTATED RINGERS IV SOLN: INTRAVENOUS | Status: DC

## 2023-11-15 MED ORDER — LACTATED RINGERS IV SOLN
500.0000 mL | INTRAVENOUS | Status: AC | PRN
  Administered 2023-11-16: 500 mL via INTRAVENOUS

## 2023-11-15 MED ORDER — FENTANYL CITRATE (PF) 100 MCG/2ML IJ SOLN
50.0000 ug | INTRAMUSCULAR | Status: DC | PRN
  Administered 2023-11-16 (×4): 100 ug via INTRAVENOUS
  Administered 2023-11-16: 50 ug via INTRAVENOUS
  Administered 2023-11-16: 100 ug via INTRAVENOUS
  Administered 2023-11-16: 50 ug via INTRAVENOUS
  Administered 2023-11-16: 100 ug via INTRAVENOUS
  Filled 2023-11-15 (×9): qty 2

## 2023-11-15 MED ORDER — OXYTOCIN-SODIUM CHLORIDE 30-0.9 UT/500ML-% IV SOLN
2.5000 [IU]/h | INTRAVENOUS | Status: DC
  Administered 2023-11-18: 2.5 [IU]/h via INTRAVENOUS
  Filled 2023-11-15 (×2): qty 500

## 2023-11-15 MED ORDER — PENICILLIN G POT IN DEXTROSE 60000 UNIT/ML IV SOLN
3.0000 10*6.[IU] | INTRAVENOUS | Status: DC
  Administered 2023-11-15 – 2023-11-17 (×9): 3 10*6.[IU] via INTRAVENOUS
  Filled 2023-11-15 (×8): qty 50

## 2023-11-15 MED ORDER — ACETAMINOPHEN 325 MG PO TABS
650.0000 mg | ORAL_TABLET | ORAL | Status: DC | PRN
  Administered 2023-11-18: 650 mg via ORAL
  Filled 2023-11-15: qty 2

## 2023-11-15 MED ORDER — SOD CITRATE-CITRIC ACID 500-334 MG/5ML PO SOLN
30.0000 mL | ORAL | Status: DC | PRN
Start: 2023-11-15 — End: 2023-11-18

## 2023-11-15 MED ORDER — INSULIN ASPART 100 UNIT/ML IJ SOLN
0.0000 [IU] | INTRAMUSCULAR | Status: DC
  Administered 2023-11-15: 4 [IU] via SUBCUTANEOUS
  Administered 2023-11-15: 3 [IU] via SUBCUTANEOUS

## 2023-11-15 MED ORDER — MISOPROSTOL 50MCG HALF TABLET
50.0000 ug | ORAL_TABLET | ORAL | Status: DC
  Administered 2023-11-15 – 2023-11-16 (×4): 50 ug via ORAL
  Filled 2023-11-15 (×4): qty 1

## 2023-11-15 MED ORDER — SODIUM CHLORIDE 0.9% FLUSH
3.0000 mL | Freq: Two times a day (BID) | INTRAVENOUS | Status: DC
  Administered 2023-11-15 – 2023-11-17 (×4): 3 mL via INTRAVENOUS

## 2023-11-15 MED ORDER — OXYCODONE-ACETAMINOPHEN 5-325 MG PO TABS: 2.0000 | ORAL_TABLET | ORAL | Status: DC | PRN

## 2023-11-15 MED ORDER — ONDANSETRON HCL 4 MG/2ML IJ SOLN
4.0000 mg | Freq: Four times a day (QID) | INTRAMUSCULAR | Status: DC | PRN
  Administered 2023-11-16 – 2023-11-17 (×3): 4 mg via INTRAVENOUS
  Filled 2023-11-15 (×3): qty 2

## 2023-11-15 MED ORDER — MISOPROSTOL 25 MCG QUARTER TABLET
25.0000 ug | ORAL_TABLET | Freq: Once | ORAL | Status: AC
  Administered 2023-11-15: 25 ug via VAGINAL
  Filled 2023-11-15: qty 1

## 2023-11-15 NOTE — Inpatient Diabetes Management (Addendum)
 Inpatient Diabetes Program Recommendations  Diabetes Treatment Program Recommendations  ADA Standards of Care Diabetes in Pregnancy Target Glucose Ranges:  Fasting: 70 - 95 mg/dL 1 hr postprandial: Less than 140mg /dL (from first bite of meal) 2 hr postprandial: Less than 120 mg/dL (from first bite of meal)    Lab Results  Component Value Date   GLUCAP 113 (H) 11/15/2023   HGBA1C 6.7 (H) 09/15/2023    Review of Glycemic Control  Latest Reference Range & Units 11/13/23 22:43 11/13/23 23:26 11/14/23 08:50 11/14/23 18:14 11/14/23 18:36 11/14/23 20:34 11/15/23 05:06  Glucose-Capillary 70 - 99 mg/dL 882 (H) 868 (H) 759 (H) 69 (L) 114 (H) 205 (H) 113 (H)  (H): Data is abnormally high (L): Data is abnormally low Diabetes history: Type 1 DM Outpatient Diabetes medications: Tandem + Humalog  0-20 units TID   Current orders for Inpatient glycemic control: Lantus  88 units every day, Novolog  36 units TID, Novolog  0-14 units TID & HS   Inpatient Diabetes Program Recommendations  At the start of induction agent consider: -Novolog  0-14 units Q4H - Lantus  25 units QHS If CBGs are consecutively >120 mg/dL, would switch to Endotool, IV insulin . Of note, patient missed lunch time meal coverage. Anticipate glucose trend to be elevated above current trend. Secure chat sent to Dr Jayne.  Following delivery of placenta would plan to resume pre-pregnant settings below: Basal insulin   00001.250 units/hour 04001.100 units/hour 06001.150 units/hour 08001.200 units/hour 15001.250 units/hour 21001.40 units/hour Total daily basal insulin : 29.6 units/24 hours   Carb Coverage 0000    1:10     1 unit for every 10 grams of carbohydrates 1200    1:6       1 unit for every 6 grams of carbohydrates   Insulin  Sensitivity 0000    1:30     1 unit drops blood glucose 30 mg/dl  Addendum: Spoke with patient again now that transferred to labor and delivery. Discussed plan for after delivery and during labor  process. Patient programmed post delivery settings. Dr Jayne and team aware and on board. RN team at bedside.   Thanks, Tinnie Minus, MSN, RNC-OB Diabetes Coordinator [phone] (8a-5p)

## 2023-11-15 NOTE — Progress Notes (Cosign Needed)
 Labor Progress Note Jay Oliviana Mcgahee is a 24 y.o. G2P0010 at [redacted]w[redacted]d presented for induction d/t poor glycemic control  S: Patient is resting comfortably. States she has been having to urinate frequently   O:  BP 126/75   Pulse 94   Temp 98.2 F (36.8 C) (Oral)   Resp 17   Ht 5' 1 (1.549 m)   Wt 98 kg   LMP 03/15/2023 (Approximate)   SpO2 98%   BMI 40.82 kg/m  EFM: 150s/moderate/acceleration+ w/ no decel  CVE: Dilation: Closed Effacement (%): 50 Station: -3 Presentation: Vertex Exam by:: Cresenzo-Dishmon CNM   A&P: 23 y.o. G2P0010 [redacted]w[redacted]d admitted for induction d/t poor glycemic control #Labor: Progressing slowly, cervix remain closed, Cytotec Q4H, routine antental care  #Pain: Per patient request #FWB: Reactive NST, Category 1 #GBS positive : IV PCN #T1DM : Appreciated recs per DM coordinator , Novolog  0-14 units Q4H, Lantus  25 units at bedtime, BMP stable    Houston Samuels, DO PGY1 [facility] Resident 9:25 PM

## 2023-11-15 NOTE — Plan of Care (Signed)
 MFM Plan of Care I spoke with the patients OB provider today, Dr. Jayne, who expressed concern about the patient's glucose status.  The patient has been admitted since 11/10/2023 and has had little improvement in her glucose levels.  While her fasting and 2-hour postprandial blood sugars are mostly below 200 she has intermittent spikes into the 300s reflecting glucose volatility.  We discussed that at 35 weeks and beyond there are no long-term health risks to a fetus compared to those who were born at 56 weeks.  However, due to the level of glucose instability delivery is favored over continuing pregnancy.  Dr. Jayne has discussed this with the patient, who prefers delivery as well.  This will also minimize the risk of stillbirth. Plan is to proceed with delivery.

## 2023-11-16 ENCOUNTER — Inpatient Hospital Stay (HOSPITAL_COMMUNITY): Admitting: Anesthesiology

## 2023-11-16 LAB — COMPREHENSIVE METABOLIC PANEL WITH GFR
ALT: 17 U/L (ref 0–44)
AST: 24 U/L (ref 15–41)
Albumin: 2.2 g/dL — ABNORMAL LOW (ref 3.5–5.0)
Alkaline Phosphatase: 119 U/L (ref 38–126)
Anion gap: 11 (ref 5–15)
BUN: 7 mg/dL (ref 6–20)
CO2: 21 mmol/L — ABNORMAL LOW (ref 22–32)
Calcium: 7.2 mg/dL — ABNORMAL LOW (ref 8.9–10.3)
Chloride: 104 mmol/L (ref 98–111)
Creatinine, Ser: 0.84 mg/dL (ref 0.44–1.00)
GFR, Estimated: >60 mL/min (ref >60)
Glucose, Bld: 110 mg/dL — ABNORMAL HIGH (ref 70–99)
Potassium: 3.6 mmol/L (ref 3.5–5.1)
Sodium: 136 mmol/L (ref 135–145)
Total Bilirubin: 0.4 mg/dL (ref 0.0–1.2)
Total Protein: 6.4 g/dL — ABNORMAL LOW (ref 6.5–8.1)

## 2023-11-16 LAB — GLUCOSE, CAPILLARY
Glucose-Capillary: 106 mg/dL — ABNORMAL HIGH (ref 70–99)
Glucose-Capillary: 124 mg/dL — ABNORMAL HIGH (ref 70–99)
Glucose-Capillary: 64 mg/dL — ABNORMAL LOW (ref 70–99)
Glucose-Capillary: 66 mg/dL — ABNORMAL LOW (ref 70–99)
Glucose-Capillary: 78 mg/dL (ref 70–99)
Glucose-Capillary: 78 mg/dL (ref 70–99)
Glucose-Capillary: 85 mg/dL (ref 70–99)
Glucose-Capillary: 89 mg/dL (ref 70–99)
Glucose-Capillary: 95 mg/dL (ref 70–99)

## 2023-11-16 LAB — CBC
HCT: 32.1 % — ABNORMAL LOW (ref 36.0–46.0)
Hemoglobin: 10.6 g/dL — ABNORMAL LOW (ref 12.0–15.0)
MCH: 25.9 pg — ABNORMAL LOW (ref 26.0–34.0)
MCHC: 33 g/dL (ref 30.0–36.0)
MCV: 78.5 fL — ABNORMAL LOW (ref 80.0–100.0)
Platelets: 199 K/uL (ref 150–400)
RBC: 4.09 MIL/uL (ref 3.87–5.11)
RDW: 14.6 % (ref 11.5–15.5)
WBC: 12.4 K/uL — ABNORMAL HIGH (ref 4.0–10.5)
nRBC: 0 % (ref 0.0–0.2)

## 2023-11-16 LAB — RPR: RPR Ser Ql: NONREACTIVE

## 2023-11-16 MED ORDER — LACTATED RINGERS IV SOLN: INTRAVENOUS | Status: AC

## 2023-11-16 MED ORDER — EPHEDRINE 5 MG/ML INJ: 10.0000 mg | INTRAVENOUS | Status: DC | PRN

## 2023-11-16 MED ORDER — DEXTROSE IN LACTATED RINGERS 5 % IV SOLN: INTRAVENOUS | Status: AC

## 2023-11-16 MED ORDER — FENTANYL-BUPIVACAINE-NACL 0.5-0.125-0.9 MG/250ML-% EP SOLN
12.0000 mL/h | EPIDURAL | Status: DC | PRN
  Administered 2023-11-16 – 2023-11-17 (×2): 12 mL/h via EPIDURAL
  Filled 2023-11-16 (×2): qty 250

## 2023-11-16 MED ORDER — INSULIN REGULAR(HUMAN) IN NACL 100-0.9 UT/100ML-% IV SOLN
INTRAVENOUS | Status: DC
  Administered 2023-11-16: 2 [IU]/h via INTRAVENOUS
  Filled 2023-11-16 (×2): qty 100

## 2023-11-16 MED ORDER — LACTATED RINGERS IV SOLN
500.0000 mL | Freq: Once | INTRAVENOUS | Status: AC
  Administered 2023-11-16: 500 mL via INTRAVENOUS

## 2023-11-16 MED ORDER — OXYTOCIN-SODIUM CHLORIDE 30-0.9 UT/500ML-% IV SOLN
1.0000 m[IU]/min | INTRAVENOUS | Status: DC
  Administered 2023-11-16: 2 m[IU]/min via INTRAVENOUS

## 2023-11-16 MED ORDER — DEXTROSE 50 % IV SOLN
0.0000 mL | INTRAVENOUS | Status: DC | PRN
  Administered 2023-11-16: 20 mL via INTRAVENOUS
  Filled 2023-11-16: qty 50

## 2023-11-16 MED ORDER — TERBUTALINE SULFATE 1 MG/ML IJ SOLN: 0.2500 mg | Freq: Once | INTRAMUSCULAR | Status: DC | PRN

## 2023-11-16 MED ORDER — PHENYLEPHRINE 80 MCG/ML (10ML) SYRINGE FOR IV PUSH (FOR BLOOD PRESSURE SUPPORT)
80.0000 ug | PREFILLED_SYRINGE | INTRAVENOUS | Status: DC | PRN
Start: 2023-11-16 — End: 2023-11-18

## 2023-11-16 MED ORDER — PHENYLEPHRINE 80 MCG/ML (10ML) SYRINGE FOR IV PUSH (FOR BLOOD PRESSURE SUPPORT): 80.0000 ug | PREFILLED_SYRINGE | INTRAVENOUS | Status: DC | PRN

## 2023-11-16 MED ORDER — DIPHENHYDRAMINE HCL 50 MG/ML IJ SOLN
12.5000 mg | INTRAMUSCULAR | Status: DC | PRN
  Administered 2023-11-17: 12.5 mg via INTRAVENOUS
  Filled 2023-11-16: qty 1

## 2023-11-16 MED ORDER — LIDOCAINE HCL (PF) 1 % IJ SOLN
INTRAMUSCULAR | Status: DC | PRN
  Administered 2023-11-16: 10 mL via EPIDURAL

## 2023-11-16 NOTE — Anesthesia Preprocedure Evaluation (Signed)
 Anesthesia Evaluation  Patient identified by MRN, date of birth, ID band Patient awake    Reviewed: Allergy & Precautions, NPO status , Patient's Chart, lab work & pertinent test results  Airway Mallampati: III  TM Distance: >3 FB Neck ROM: Full    Dental no notable dental hx.    Pulmonary neg pulmonary ROS, former smoker   Pulmonary exam normal breath sounds clear to auscultation       Cardiovascular hypertension, Normal cardiovascular exam Rhythm:Regular Rate:Normal     Neuro/Psych  Headaches PSYCHIATRIC DISORDERS Anxiety Depression       GI/Hepatic Neg liver ROS,GERD  ,,  Endo/Other  diabetes, Type 1, Insulin  DependentHypothyroidism  Class 3 obesity (BMI 41)  Renal/GU negative Renal ROS  negative genitourinary   Musculoskeletal negative musculoskeletal ROS (+)    Abdominal   Peds  Hematology negative hematology ROS (+)   Anesthesia Other Findings IOL for uncontrolled Type 1 DM  Reproductive/Obstetrics (+) Pregnancy                              Anesthesia Physical Anesthesia Plan  ASA: 3  Anesthesia Plan: Epidural   Post-op Pain Management:    Induction:   PONV Risk Score and Plan: Treatment may vary due to age or medical condition  Airway Management Planned: Natural Airway  Additional Equipment:   Intra-op Plan:   Post-operative Plan:   Informed Consent: I have reviewed the patients History and Physical, chart, labs and discussed the procedure including the risks, benefits and alternatives for the proposed anesthesia with the patient or authorized representative who has indicated his/her understanding and acceptance.       Plan Discussed with: Anesthesiologist  Anesthesia Plan Comments: (Patient identified. Risks, benefits, options discussed with patient including but not limited to bleeding, infection, nerve damage, paralysis, failed block, incomplete pain control,  headache, blood pressure changes, nausea, vomiting, reactions to medication, itching, and post partum back pain. Confirmed with bedside nurse the patient's most recent platelet count. Confirmed with the patient that they are not taking any anticoagulation, have any bleeding history or any family history of bleeding disorders. Patient expressed understanding and wishes to proceed. All questions were answered. )        Anesthesia Quick Evaluation

## 2023-11-16 NOTE — Anesthesia Procedure Notes (Signed)
 Epidural Patient location during procedure: OB Start time: 11/16/2023 6:55 PM End time: 11/16/2023 7:05 PM  Staffing Anesthesiologist: Niels Marien CROME, MD Performed: anesthesiologist   Preanesthetic Checklist Completed: patient identified, IV checked, risks and benefits discussed, monitors and equipment checked, pre-op evaluation and timeout performed  Epidural Patient position: sitting Prep: DuraPrep and site prepped and draped Patient monitoring: continuous pulse ox, blood pressure, heart rate and cardiac monitor Approach: midline Location: L3-L4 Injection technique: LOR air  Needle:  Needle type: Tuohy  Needle gauge: 17 G Needle length: 9 cm Needle insertion depth: 7 cm Catheter type: closed end flexible Catheter size: 19 Gauge Catheter at skin depth: 12 cm Test dose: negative  Assessment Sensory level: T8 Events: blood not aspirated, no cerebrospinal fluid, injection not painful, no injection resistance, no paresthesia and negative IV test  Additional Notes Patient identified. Risks/Benefits/Options discussed with patient including but not limited to bleeding, infection, nerve damage, paralysis, failed block, incomplete pain control, headache, blood pressure changes, nausea, vomiting, reactions to medication both or allergic, itching and postpartum back pain. Confirmed with bedside nurse the patient's most recent platelet count. Confirmed with patient that they are not currently taking any anticoagulation, have any bleeding history or any family history of bleeding disorders. Patient expressed understanding and wished to proceed. All questions were answered. Sterile technique was used throughout the entire procedure. Please see nursing notes for vital signs. Test dose was given through epidural catheter and negative prior to continuing to dose epidural or start infusion. Warning signs of high block given to the patient including shortness of breath, tingling/numbness in  hands, complete motor block, or any concerning symptoms with instructions to call for help. Patient was given instructions on fall risk and not to get out of bed. All questions and concerns addressed with instructions to call with any issues or inadequate analgesia.  Reason for block:procedure for pain

## 2023-11-16 NOTE — Inpatient Diabetes Management (Addendum)
 Inpatient Diabetes Program Recommendations  AACE/ADA: New Consensus Statement on Inpatient Glycemic Control (2015)  Target Ranges:  Prepandial:   less than 140 mg/dL      Peak postprandial:   less than 180 mg/dL (1-2 hours)      Critically ill patients:  140 - 180 mg/dL   Lab Results  Component Value Date   GLUCAP 64 (L) 11/16/2023   HGBA1C 6.7 (H) 09/15/2023    Review of Glycemic Control  Latest Reference Range & Units 11/14/23 18:36 11/14/23 20:34 11/15/23 05:06 11/15/23 16:46 11/16/23 00:50 11/16/23 02:47 11/16/23 03:41 11/16/23 04:57  Glucose-Capillary 70 - 99 mg/dL 885 (H) 794 (H) 886 (H) 228 (H) 95 85 78 64 (L)  (H): Data is abnormally high (L): Data is abnormally low  Diabetes history: DM1 Outpatient Diabetes medications: T-slim pump with Humalog  0-20 units TID Current orders for Inpatient glycemic control: Lantus  26 units every day, Novolog  0-14 units Q4H  Inpatient Diabetes Program Recommendations:    IOL  Please start IV insulin .  Met with patient at bedside.  Patient received 88 units of Lantus  @ 1400, and another 26 units at 2200.  Patient states she was low all night with her CGM.  Secure chat sent to MD.  RN's at bedside and discussed recommendations.    Following delivery of placenta would plan to resume pre-pregnant settings below: Basal insulin   00001.250 units/hour 04001.100 units/hour 06001.150 units/hour 08001.200 units/hour 15001.250 units/hour 21001.40 units/hour Total daily basal insulin : 29.6 units/24 hours  Carb Coverage 0000    1:10     1 unit for every 10 grams of carbohydrates 1200    1:6       1 unit for every 6 grams of carbohydrates   Insulin  Sensitivity 0000    1:30     1 unit drops blood glucose 30 mg/dl  She has adjusted her insulin  pump setting for postpartum.    Will follow.  Thank you, Cassandra Pinal, MSN, CDCES Diabetes Coordinator Inpatient Diabetes Program [phone] (team pager from 8a-5p)

## 2023-11-16 NOTE — Progress Notes (Signed)
 Labor Progress Note Cassandra [name] is a 23 y.o. G2P0010 at [redacted]w[redacted]d presented for IOL for uncontrolled T1DM  S:  Comfortable, has questions about induction process and pain management  O:  BP (!) 110/56   Pulse 78   Temp 98.2 F (36.8 C) (Oral)   Resp 18   Ht 5' 1 (1.549 m)   Wt 98 kg   LMP 03/15/2023 (Approximate)   SpO2 98%   BMI 40.82 kg/m  EFM: baseline 130 bpm/ moderate variability/ - accels/ - decels  Toco/IUPC: Irritability SVE: Dilation: Closed Effacement (%): 70 Station: Ballotable Presentation: Vertex (By bedside ultrasound) Exam by:: Sherrell C-D, CNm   A/P: 23 y.o. G2P0010 [redacted]w[redacted]d in latent labor 1. Labor: Oral miso given at 8:16am, plan for SVE at 12:15p with anticipated plan of placing foley balloon at that time with pre-medication, patient amenable to plan 2. Pain: IV pain medication at this time, plan for epidural when further dilated 3. GBS unknown, PCN ppx 4. T1DM: Seen this morning by Diabetes coordinator, recommended switch to EndoTool while patient is in labor given hypoglycemia with high basal dosing, plan to restart patient's insulin  pump after delivery, awaiting note from Diabetes coordinator with recommendations   [name] Courts, MD 10:41 AM

## 2023-11-16 NOTE — Progress Notes (Signed)
 Labor Progress Note Casha Santiana Glidden is a 23 y.o. G2P0010 at [redacted]w[redacted]d presented for IOL for uncontrolled T1DM  S:  Comfortable, requesting pain medications for exam and possible balloon placement  O:  BP 118/79   Pulse 90   Temp 97.8 F (36.6 C) (Oral)   Resp 18   Ht 5' 1 (1.549 m)   Wt 98 kg   LMP 03/15/2023 (Approximate)   SpO2 98%   BMI 40.82 kg/m  EFM: baseline 140 bpm/ moderate variability/ + accels/ neg decels  Toco/IUPC: Irritability SVE: Dilation: 1 Effacement (%): 50 Station: -3 Presentation: Vertex (By bedside ultrasound) Exam by:: [name], MD   A/P: 22 y.o. G2P0010 [redacted]w[redacted]d  1. Labor: Cooks placed with 60mL, will monitor contractions for 20 min prior to decision re: medication for continued augmentation 2. Pain: IV pain medications, anticipating epidural when ready 3. GBS pending, PCN ppx  4. T1DM: Endotool for management   [name] [name], MD 2:44 PM

## 2023-11-16 NOTE — Progress Notes (Signed)
 Labor Progress Note Cassandra [name] is a 22 y.o. G2P0010 at [redacted]w[redacted]d presented for IOL for uncontrolled T1DM  S:  Becoming more uncomfortable, asking about epidural  O:  BP 132/80   Pulse (!) 122   Temp 98.3 F (36.8 C) (Oral)   Resp 18   Ht 5' 1 (1.549 m)   Wt 98 kg   LMP 03/15/2023 (Approximate)   SpO2 98%   BMI 40.82 kg/m  EFM: baseline 140 bpm/ moderate variability/ + accels/ - decels  Toco/IUPC: ctx q2-58min SVE: Dilation: 1 Effacement (%): 50 Station: -3 Presentation: Vertex (By bedside ultrasound) Exam by:: [name], MD  A/P: 23 y.o. G2P0010 [redacted]w[redacted]d  1. Labor: Continues with Foley balloon & oral miso for augmentation 2. Pain: Epidural 3. GBS unknown, PCN for ppx 4. T1DM: On EndoTool   Dayton Sherr A Marthe Dant, MD 7:13 PM

## 2023-11-16 NOTE — Progress Notes (Signed)
 Patient Vitals for the past 4 hrs:  BP Temp Temp src Pulse SpO2  11/16/23 0709 (!) 145/93 -- -- (!) 114 --  11/16/23 0705 -- 98.2 F (36.8 C) Oral -- --  11/16/23 0503 129/88 -- -- (!) 108 98 %   Using IV pain medicine for ctx pain.  FHR Cat 1, ctx q 1-2  minutes. Cx softer, shorter, not quite FT, -2. BS 60s, 80 and now 107. Will place Foley when able.

## 2023-11-16 NOTE — Progress Notes (Signed)
 Cassandra [name] is a 23 y.o. G2P0010 at [redacted]w[redacted]d by ultrasound admitted for induction of labor due to uncontrolled T1DM.  Subjective: Pt comfortable with epidural. Family in room for support.  Objective: BP (!) 106/56   Pulse 85   Temp 98.4 F (36.9 C) (Oral)   Resp 18   Ht 5' 1 (1.549 m)   Wt 98 kg   LMP 03/15/2023 (Approximate)   SpO2 97%   BMI 40.82 kg/m  I/O last 3 completed shifts: In: -  Out: 950 [Urine:950] No intake/output data recorded.  FHT:  FHR: 135 bpm, variability: moderate,  accelerations:  Present,  decelerations:  Absent UC:   irregular, every 2-4 minutes SVE:   Dilation: 4 Effacement (%): 50 Station: Ballotable Exam by:: SYSCO RN  Labs: Lab Results  Component Value Date   WBC 12.4 (H) 11/16/2023   HGB 10.6 (L) 11/16/2023   HCT 32.1 (L) 11/16/2023   MCV 78.5 (L) 11/16/2023   PLT 199 11/16/2023    Assessment / Plan: IOL for uncontrolled T1DM  Labor: Progressing normally Preeclampsia:  labs stable Fetal Wellbeing:  Category I Pain Control:  Epidural I/D:  GBS pending>>PCN Anticipated MOD:  NSVD  Olam Boards, CNM 11/16/2023, 10:54 PM

## 2023-11-17 ENCOUNTER — Encounter (HOSPITAL_COMMUNITY): Payer: Self-pay

## 2023-11-17 ENCOUNTER — Encounter: Admitting: Obstetrics and Gynecology

## 2023-11-17 LAB — GLUCOSE, CAPILLARY
Glucose-Capillary: 111 mg/dL — ABNORMAL HIGH (ref 70–99)
Glucose-Capillary: 113 mg/dL — ABNORMAL HIGH (ref 70–99)
Glucose-Capillary: 114 mg/dL — ABNORMAL HIGH (ref 70–99)
Glucose-Capillary: 120 mg/dL — ABNORMAL HIGH (ref 70–99)
Glucose-Capillary: 121 mg/dL — ABNORMAL HIGH (ref 70–99)
Glucose-Capillary: 123 mg/dL — ABNORMAL HIGH (ref 70–99)
Glucose-Capillary: 126 mg/dL — ABNORMAL HIGH (ref 70–99)
Glucose-Capillary: 127 mg/dL — ABNORMAL HIGH (ref 70–99)
Glucose-Capillary: 128 mg/dL — ABNORMAL HIGH (ref 70–99)
Glucose-Capillary: 128 mg/dL — ABNORMAL HIGH (ref 70–99)
Glucose-Capillary: 131 mg/dL — ABNORMAL HIGH (ref 70–99)
Glucose-Capillary: 136 mg/dL — ABNORMAL HIGH (ref 70–99)
Glucose-Capillary: 137 mg/dL — ABNORMAL HIGH (ref 70–99)
Glucose-Capillary: 140 mg/dL — ABNORMAL HIGH (ref 70–99)
Glucose-Capillary: 140 mg/dL — ABNORMAL HIGH (ref 70–99)
Glucose-Capillary: 149 mg/dL — ABNORMAL HIGH (ref 70–99)
Glucose-Capillary: 157 mg/dL — ABNORMAL HIGH (ref 70–99)
Glucose-Capillary: 95 mg/dL (ref 70–99)

## 2023-11-17 LAB — COMPREHENSIVE METABOLIC PANEL WITH GFR
ALT: 18 U/L (ref 0–44)
ALT: 15 U/L (ref 0–44)
ALT: 17 U/L (ref 0–44)
AST: 33 U/L (ref 15–41)
AST: 35 U/L (ref 15–41)
AST: 33 U/L (ref 15–41)
Albumin: 2.3 g/dL — ABNORMAL LOW (ref 3.5–5.0)
Albumin: 1.9 g/dL — ABNORMAL LOW (ref 3.5–5.0)
Albumin: 1.9 g/dL — ABNORMAL LOW (ref 3.5–5.0)
Alkaline Phosphatase: 125 U/L (ref 38–126)
Alkaline Phosphatase: 113 U/L (ref 38–126)
Alkaline Phosphatase: 115 U/L (ref 38–126)
Anion gap: 15 (ref 5–15)
Anion gap: 14 (ref 5–15)
Anion gap: 15 (ref 5–15)
BUN: 12 mg/dL (ref 6–20)
BUN: 14 mg/dL (ref 6–20)
BUN: 14 mg/dL (ref 6–20)
CO2: 20 mmol/L — ABNORMAL LOW (ref 22–32)
CO2: 18 mmol/L — ABNORMAL LOW (ref 22–32)
CO2: 17 mmol/L — ABNORMAL LOW (ref 22–32)
Calcium: 6.5 mg/dL — ABNORMAL LOW (ref 8.9–10.3)
Calcium: 6.3 mg/dL — CL (ref 8.9–10.3)
Calcium: 6.2 mg/dL — CL (ref 8.9–10.3)
Chloride: 99 mmol/L (ref 98–111)
Chloride: 101 mmol/L (ref 98–111)
Chloride: 101 mmol/L (ref 98–111)
Creatinine, Ser: 1.51 mg/dL — ABNORMAL HIGH (ref 0.44–1.00)
Creatinine, Ser: 1.75 mg/dL — ABNORMAL HIGH (ref 0.44–1.00)
Creatinine, Ser: 1.93 mg/dL — ABNORMAL HIGH (ref 0.44–1.00)
GFR, Estimated: 50 mL/min — ABNORMAL LOW (ref >60)
GFR, Estimated: 41 mL/min — ABNORMAL LOW (ref >60)
GFR, Estimated: 37 mL/min — ABNORMAL LOW (ref >60)
Glucose, Bld: 118 mg/dL — ABNORMAL HIGH (ref 70–99)
Glucose, Bld: 123 mg/dL — ABNORMAL HIGH (ref 70–99)
Glucose, Bld: 127 mg/dL — ABNORMAL HIGH (ref 70–99)
Potassium: 3.6 mmol/L (ref 3.5–5.1)
Potassium: 3.4 mmol/L — ABNORMAL LOW (ref 3.5–5.1)
Potassium: 3.5 mmol/L (ref 3.5–5.1)
Sodium: 134 mmol/L — ABNORMAL LOW (ref 135–145)
Sodium: 133 mmol/L — ABNORMAL LOW (ref 135–145)
Sodium: 133 mmol/L — ABNORMAL LOW (ref 135–145)
Total Bilirubin: 0.7 mg/dL (ref 0.0–1.2)
Total Bilirubin: 0.7 mg/dL (ref 0.0–1.2)
Total Bilirubin: 0.6 mg/dL (ref 0.0–1.2)
Total Protein: 6.5 g/dL (ref 6.5–8.1)
Total Protein: 5.8 g/dL — ABNORMAL LOW (ref 6.5–8.1)
Total Protein: 5.8 g/dL — ABNORMAL LOW (ref 6.5–8.1)

## 2023-11-17 LAB — CBC
HCT: 32.4 % — ABNORMAL LOW (ref 36.0–46.0)
HCT: 30.3 % — ABNORMAL LOW (ref 36.0–46.0)
HCT: 29.9 % — ABNORMAL LOW (ref 36.0–46.0)
Hemoglobin: 10.6 g/dL — ABNORMAL LOW (ref 12.0–15.0)
Hemoglobin: 10 g/dL — ABNORMAL LOW (ref 12.0–15.0)
Hemoglobin: 9.7 g/dL — ABNORMAL LOW (ref 12.0–15.0)
MCH: 26 pg (ref 26.0–34.0)
MCH: 26 pg (ref 26.0–34.0)
MCH: 25.7 pg — ABNORMAL LOW (ref 26.0–34.0)
MCHC: 32.7 g/dL (ref 30.0–36.0)
MCHC: 33 g/dL (ref 30.0–36.0)
MCHC: 32.4 g/dL (ref 30.0–36.0)
MCV: 79.6 fL — ABNORMAL LOW (ref 80.0–100.0)
MCV: 78.7 fL — ABNORMAL LOW (ref 80.0–100.0)
MCV: 79.1 fL — ABNORMAL LOW (ref 80.0–100.0)
Platelets: 219 K/uL (ref 150–400)
Platelets: 204 K/uL (ref 150–400)
Platelets: 190 K/uL (ref 150–400)
RBC: 4.07 MIL/uL (ref 3.87–5.11)
RBC: 3.85 MIL/uL — ABNORMAL LOW (ref 3.87–5.11)
RBC: 3.78 MIL/uL — ABNORMAL LOW (ref 3.87–5.11)
RDW: 14.8 % (ref 11.5–15.5)
RDW: 14.8 % (ref 11.5–15.5)
RDW: 15 % (ref 11.5–15.5)
WBC: 20.7 K/uL — ABNORMAL HIGH (ref 4.0–10.5)
WBC: 22.9 K/uL — ABNORMAL HIGH (ref 4.0–10.5)
WBC: 24.7 K/uL — ABNORMAL HIGH (ref 4.0–10.5)
nRBC: 0.1 % (ref 0.0–0.2)
nRBC: 0.1 % (ref 0.0–0.2)
nRBC: 0.1 % (ref 0.0–0.2)

## 2023-11-17 LAB — CULTURE, BETA STREP (GROUP B ONLY)

## 2023-11-17 LAB — BASIC METABOLIC PANEL WITH GFR
Anion gap: 13 (ref 5–15)
BUN: 8 mg/dL (ref 6–20)
CO2: 19 mmol/L — ABNORMAL LOW (ref 22–32)
Calcium: 7 mg/dL — ABNORMAL LOW (ref 8.9–10.3)
Chloride: 105 mmol/L (ref 98–111)
Creatinine, Ser: 1.01 mg/dL — ABNORMAL HIGH (ref 0.44–1.00)
GFR, Estimated: >60 mL/min (ref >60)
Glucose, Bld: 143 mg/dL — ABNORMAL HIGH (ref 70–99)
Potassium: 3.7 mmol/L (ref 3.5–5.1)
Sodium: 137 mmol/L (ref 135–145)

## 2023-11-17 MED ORDER — CALCIUM CARBONATE ANTACID 500 MG PO CHEW
400.0000 mg | CHEWABLE_TABLET | Freq: Three times a day (TID) | ORAL | Status: DC
  Administered 2023-11-17: 400 mg via ORAL
  Filled 2023-11-17: qty 2

## 2023-11-17 MED ORDER — LORAZEPAM 2 MG/ML IJ SOLN
1.0000 mg | Freq: Once | INTRAMUSCULAR | Status: AC
  Administered 2023-11-17: 1 mg via INTRAVENOUS

## 2023-11-17 MED ORDER — HYDROXYZINE HCL 50 MG PO TABS
25.0000 mg | ORAL_TABLET | Freq: Once | ORAL | Status: AC
  Administered 2023-11-17: 25 mg via ORAL
  Filled 2023-11-17: qty 1

## 2023-11-17 MED ORDER — LORAZEPAM 2 MG/ML IJ SOLN
1.0000 mg | Freq: Once | INTRAMUSCULAR | Status: AC
  Administered 2023-11-17: 1 mg via INTRAVENOUS
  Filled 2023-11-17: qty 1

## 2023-11-17 MED ORDER — LORAZEPAM 2 MG/ML IJ SOLN
INTRAMUSCULAR | Status: AC
  Filled 2023-11-17: qty 1

## 2023-11-17 MED ORDER — GENTAMICIN SULFATE 40 MG/ML IJ SOLN
5.0000 mg/kg | INTRAVENOUS | Status: DC
  Administered 2023-11-17: 340 mg via INTRAVENOUS
  Filled 2023-11-17 (×2): qty 8.5

## 2023-11-17 MED ORDER — LORAZEPAM 0.5 MG PO TABS
0.5000 mg | ORAL_TABLET | Freq: Once | ORAL | Status: DC
  Filled 2023-11-17: qty 1

## 2023-11-17 MED ORDER — FENTANYL CITRATE (PF) 100 MCG/2ML IJ SOLN
INTRAMUSCULAR | Status: DC | PRN
  Administered 2023-11-17 (×3): 100 ug via EPIDURAL

## 2023-11-17 MED ORDER — FENTANYL CITRATE (PF) 100 MCG/2ML IJ SOLN
INTRAMUSCULAR | Status: AC
  Filled 2023-11-17: qty 2

## 2023-11-17 MED ORDER — BUPIVACAINE HCL (PF) 0.25 % IJ SOLN
INTRAMUSCULAR | Status: DC | PRN
  Administered 2023-11-17: 8 mL via EPIDURAL
  Administered 2023-11-17: 3 mL via EPIDURAL
  Administered 2023-11-17: 6 mL via EPIDURAL

## 2023-11-17 MED ORDER — ACETAMINOPHEN 500 MG PO TABS
1000.0000 mg | ORAL_TABLET | Freq: Once | ORAL | Status: AC
  Administered 2023-11-17: 1000 mg via ORAL
  Filled 2023-11-17: qty 2

## 2023-11-17 MED ORDER — FUROSEMIDE 10 MG/ML IJ SOLN: 10.0000 mg | Freq: Once | INTRAMUSCULAR | Status: DC

## 2023-11-17 MED ORDER — SODIUM CHLORIDE 0.9 % IV SOLN
2.0000 g | Freq: Four times a day (QID) | INTRAVENOUS | Status: DC
  Administered 2023-11-17 (×2): 2 g via INTRAVENOUS
  Filled 2023-11-17 (×2): qty 2000

## 2023-11-17 NOTE — Progress Notes (Signed)
 Cassandra [name] is a 23 y.o. G2P0010 at [redacted]w[redacted]d by 10 week ultrasound admitted for induction of labor due to uncontrolled T1DM.  Subjective: Pt comfortable with epidural. Family in room for support.   Objective: BP 106/70   Pulse (!) 122   Temp 98.1 F (36.7 C) (Oral)   Resp 18   Ht 5' 1 (1.549 m)   Wt 98 kg   LMP 03/15/2023 (Approximate)   SpO2 96%   BMI 40.82 kg/m  I/O last 3 completed shifts: In: -  Out: 950 [Urine:950] Total I/O In: -  Out: 400 [Urine:400]  FHT:  FHR: 135 bpm, variability: moderate,  accelerations:  Present,  decelerations:  Absent UC:   irregular, every 1-4 minutes SVE:   Dilation: 4 Effacement (%): 50 Station: -2 Exam by:: Lilyona Richner CNM AROM with pink tinged fluid, IUPC placed. Pt tolerated well.  Labs: Lab Results  Component Value Date   WBC 12.4 (H) 11/16/2023   HGB 10.6 (L) 11/16/2023   HCT 32.1 (L) 11/16/2023   MCV 78.5 (L) 11/16/2023   PLT 199 11/16/2023    Assessment / Plan: Augmentation of labor, progressing well  Labor: Progressing normally Preeclampsia:  n/a Fetal Wellbeing:  Category I Pain Control:  Epidural I/D:  GBS neg Anticipated MOD:  NSVD  Olam Boards, CNM 11/17/2023, 2:10 AM

## 2023-11-17 NOTE — Progress Notes (Signed)
 Patient ID: Cassandra [name], female   DOB: Dec 21, 2000, 23 y.o.   MRN: [id number]  Still experiencing chest pain/SOB; not feeling pressure/urge to push  BPs 103/60, 124/50 FHR 150s, min variability at times, no decels Ctx q 2-4 mins with Pit at 27mu/min Cx C/C/vtx +1/+2  CBGs 95-136 today  IUP@35 .4wks End 1st stage T1DM cHTN Triple I  Ativan ordered Will begin pushing when feeing like SOB has resolved and is able Anticipate vag delivery  Cassandra [name] CNM 11/17/2023 10:04 PM

## 2023-11-17 NOTE — Progress Notes (Signed)
 Labor Progress Note Cassandra [name] is a 23 y.o. G2P0010 at [redacted]w[redacted]d presented for IOL for uncontrolled T1DM  S:  Having L hip pain, anesthesia at the bedside giving a bolus  O:  BP (!) 143/85   Pulse (!) 124   Temp (!) 100.4 F (38 C) (Axillary)   Resp 18   Ht 5' 1 (1.549 m)   Wt 98 kg   LMP 03/15/2023 (Approximate)   SpO2 96%   BMI 40.82 kg/m  EFM: baseline 155 bpm/ moderate variability/ + accels/ - decels  Toco/IUPC: ctx q72min SVE: Dilation: 9 Effacement (%): 90 Station: Plus 1 Presentation: Vertex Exam by:: Laban, MD Pitocin: 12 mu/min  A/P: 23 y.o. G2P0010 [redacted]w[redacted]d  1. Labor: Continues on pitocin for augmentation 2. Pain: Epidural, receiving bolus for epidural 3. GBS negative 4. Chorio: on ampicillin + gentamicin 5. T1DM: continues on EndoTool 6. Hypocalcemia: Critical calcium  6.3, asymptomatic, will treat with Tums  - Anticipate SVD.  [name] Courts, MD 7:50 PM

## 2023-11-17 NOTE — Progress Notes (Signed)
 Patient reporting chest pain. Reports that this is a chronic issue for her that precedes pregnancy and she has had it on/off throughout her pregnancy as well. Reports that episodes are transient and feel like pressure in her chest. Denies symptoms of anxiety. Reports that she has a hx of tachycardia. This episode feels similar to other episodes. Had EKG earlier today with sinus tachycardia.  Vitals within normal limits. CV/Resp exam within normal limits. Will continue to monitor and provide supportive care.  Rollo ONEIDA Bring, MD, FACOG Obstetrician & Gynecologist, [facility] for [facility], [facility]

## 2023-11-17 NOTE — Progress Notes (Signed)
 Labor Progress Note Cassandra [name] is a 23 y.o. G2P0010 at [redacted]w[redacted]d presented for IOL for uncontrolled T1DM  S:  Seen at bedside due to continued difficulty breathing. Lungs remain clear on exam, significant non-pitting edema in BLE  O:  BP 120/85   Pulse (!) 134   Temp (P) 99.4 F (37.4 C) (Axillary)   Resp 18   Ht 5' 1 (1.549 m)   Wt 98 kg   LMP 03/15/2023 (Approximate)   SpO2 96%   BMI 40.82 kg/m  EFM: baseline 160 bpm/ minimal variability/ - accels/ recurrent late decels  Toco/IUPC: ctx q44min SVE: Dilation: 8.5 Effacement (%): 80, 90 Station: 0 Presentation: Vertex Exam by:: [name] MD Pitocin: 20 mu/min  A/P: 23 y.o. G2P0010 [redacted]w[redacted]d  1. Labor: Continue augmentation with pitocin, but given change in variability, will cut dose of pitocin in half to 10 2. Pain: Epidural 3. GBS negative 4. Difficulty breathing: started on amp & gent for infection, EKG showing sinus tachycardia, labs showing increase in WBC and creatinine. Will start SCDs and start position changes. As patient is progressing, will continue in labor   - Anticipate SVD.  [name] [name], MD 3:30 PM

## 2023-11-17 NOTE — Progress Notes (Signed)
 Patient having chest pain which this RN reported to Luke Gentry, CNM and asked for order of ativan. Dr. Cleatus made aware and came to bedside to assess. Dr. Cleatus listened to lungs and this RN told her the plan for ativan and hx of EKG. Dr. Cleatus aware.

## 2023-11-17 NOTE — Progress Notes (Signed)
 Labor Progress Note Cassandra [name] is a 23 y.o. G2P0010 at [redacted]w[redacted]d presented for IOL for uncontrolled T1DM  S:  Evaluated at bedside due to difficulty breathing and subsequent anxiety. Patient mentions that she has a family history of SVT and a personal history of inappropriate sinus tachycardia.   O:  BP 135/83   Pulse 90   Temp (!) 101 F (38.3 C) (Axillary)   Resp 18   Ht 5' 1 (1.549 m)   Wt 98 kg   LMP 03/15/2023 (Approximate)   SpO2 96%   BMI 40.82 kg/m  EFM: baseline 150 bpm/ moderate variability/ + accels/ occasional late decels  Toco/IUPC: ctx q80min SVE: Dilation: 4.5 Effacement (%): 80, 90 Station: -1 Presentation: Vertex Exam by:: [name] MD Pitocin: 20 mu/min  A/P: 23 y.o. G2P0010 [redacted]w[redacted]d  1. Labor: Continues on pitocin for augmentation 2. Pain: Epidural 3. GBS negative, PCN stopped 4. Tachycardia: Plan for EKG, CBC, CMP, hydroxyzine  for anxiety 5. Triple I: Maternal fever and fetal tachycardia, will give tylenol  and start ampicillin and gentamicin for presumed infection   [name] [name], MD 2:05 PM

## 2023-11-17 NOTE — Progress Notes (Addendum)
 Labor Progress Note Cassandra [name] is a 23 y.o. G2P0010 at [redacted]w[redacted]d presented for IOL for uncontrolled T1DM   S:   FHR variability noted to decrease, RN suggested O2. Patient was placed on 2L [place] O2 at this time. Was also recently started on amp & gent for possible AIA.   O:  BP 124/81   Pulse (!) 127   Temp 99.4 F (37.4 C) (Axillary)   Resp 18   Ht 5' 1 (1.549 m)   Wt 98 kg   LMP 03/15/2023 (Approximate)   SpO2 96%   BMI 40.82 kg/m   EFM: baseline 155 bpm/ POOR variability/ -- accels/ -- decels  Toco/IUPC: 2-4 minutes SVE: Dilation: 7 Effacement (%): 90 Station: 0 Presentation: Vertex Exam by:: Camie Lao RN Pitocin: 10 mu/min  A/P: 23 y.o. G2P0010 [redacted]w[redacted]d IOL for uncontrolled T1DM 1. Labor: Continue IOL, given changes pit may be increased to proceed with labor or will have a conversation about C/S 2. FWB: Cat II --> Pt on 2L O2, may further decrease pitocin 3. Pain: epidural 4. GBS: negative  Anticipate SVD but may need to consider C/S.   Terrace Compton, MD 5:16 PM  Attestation of Supervision of Resident: Evaluation and management procedures were performed by the learners: Family Medicine Resident under my supervision. I was immediately available for direct supervision, assistance and direction throughout this encounter.  I also confirm that I have verified the information documented in the resident's note, and that I have also personally reperformed the pertinent components of the physical exam and all of the medical decision making activities.  I have also made any necessary editorial changes.  [name] Courts, MD FMOB Fellow, Faculty Practice [facility], Center for [facility]

## 2023-11-17 NOTE — Progress Notes (Addendum)
 Labor Progress Note Devlynn Mizuki Hoel is a 23 y.o. G2P0010 at [redacted]w[redacted]d by 10 week ultrasound admitted for induction of labor due to uncontrolled T1DM.   S:  One of Joreen's IV's was lost and needs to be replaced, IV team in the room at this time. She also is complaining of pain in her back from over using her epidural. RN explained she may need to wait some time before re-dosing. Annaliyah's mother and FOB are bedside.   O:  BP 136/81   Pulse (!) 105   Temp 98.4 F (36.9 C) (Axillary)   Resp 18   Ht 5' 1 (1.549 m)   Wt 98 kg   LMP 03/15/2023 (Approximate)   SpO2 96%   BMI 40.82 kg/m   EFM: baseline 150 bpm/ good variability/ + accels/ -- decels  Toco/IUPC: 1-5 minutes SVE: Dilation: 4.5 Effacement (%): 80, 90 Station: -1 Presentation: Vertex Exam by:: [name] MD Pitocin: 18 mu/min  A/P: Aimar Aleeyah Bensen is a 23 y.o. G2P0010 at [redacted]w[redacted]d by 10 week ultrasound admitted for induction of labor due to uncontrolled T1DM.  1. Labor: IOL, currently on 18 of pit; was AROM'd at 0136 and Cooks was out by 1430 yesterday, has had multiple doses of cytotec 2. FWB: Cat 1 3. Pain: epidural 4. IV access lost: IV team in room  Terrace Compton, MD 12:27 PM  Attestation of Supervision of Resident: Evaluation and management procedures were performed by the learners: Family Medicine Resident under my supervision. I was immediately available for direct supervision, assistance and direction throughout this encounter.  I also confirm that I have verified the information documented in the resident's note, and that I have also personally reperformed the pertinent components of the physical exam and all of the medical decision making activities.  I have also made any necessary editorial changes.  [name] [name], MD FMOB Fellow, Faculty Practice [facility], Center for [facility]

## 2023-11-17 NOTE — Progress Notes (Signed)
 Pharmacy Antibiotic Note  Cassandra [name] is a 23 y.o. female admitted on 11/10/2023 with chorioamnionitis. Pharmacy has been consulted for gentamicin dosing.  Plan: Gentamicin 5 mg/kg (340 mg) IV Q24H based off of 67.9 kg Ampicillin 2 gm IV Q6H  Height: 5' 1 (154.9 cm) Weight: 98 kg (216 lb 0.8 oz) IBW/kg (Calculated) : 47.8  Temp (24hrs), Avg:98.6 F (37 C), Min:97.7 F (36.5 C), Max:101 F (38.3 C)  Recent Labs  Lab 11/10/23 2001 11/11/23 0004 11/11/23 0536 11/12/23 0524 11/15/23 0429 11/16/23 1802 11/17/23 0544  WBC 13.4* 13.2*  --   --  11.3* 12.4*  --   CREATININE 0.83 0.71 0.71 0.82 0.75 0.84 1.01*    Estimated Creatinine Clearance: 92.9 mL/min (A) (by C-G formula based on SCr of 1.01 mg/dL (H)).    Allergies  Allergen Reactions   Shellfish Protein-Containing Drug Products Diarrhea and Nausea And Vomiting   Tape Itching    Medical tape causes itching   Amoxicillin-Pot Clavulanate Nausea And Vomiting and Other (See Comments)   Lactose Intolerance (Gi)    Shellfish Allergy Nausea And Vomiting and Other (See Comments)   Tilactase Nausea And Vomiting   Latex Rash    Pharmacy will continue to monitor patient status, creatine clearance, and urine output. Will order levels as needed. Thank you for allowing pharmacy to be a part of this patient's care.   Rosina DEL Simpson Paulos 11/17/2023 2:11 PM

## 2023-11-18 ENCOUNTER — Encounter (HOSPITAL_COMMUNITY): Payer: Self-pay | Admitting: Obstetrics and Gynecology

## 2023-11-18 DIAGNOSIS — O411230 Chorioamnionitis, third trimester, not applicable or unspecified: Secondary | ICD-10-CM | POA: Diagnosis not present

## 2023-11-18 DIAGNOSIS — Z3A35 35 weeks gestation of pregnancy: Secondary | ICD-10-CM

## 2023-11-18 DIAGNOSIS — N179 Acute kidney failure, unspecified: Secondary | ICD-10-CM | POA: Diagnosis not present

## 2023-11-18 DIAGNOSIS — O24013 Pre-existing diabetes mellitus, type 1, in pregnancy, third trimester: Secondary | ICD-10-CM | POA: Diagnosis not present

## 2023-11-18 DIAGNOSIS — O134 Gestational [pregnancy-induced] hypertension without significant proteinuria, complicating childbirth: Secondary | ICD-10-CM | POA: Diagnosis not present

## 2023-11-18 LAB — GLUCOSE, CAPILLARY
Glucose-Capillary: 118 mg/dL — ABNORMAL HIGH (ref 70–99)
Glucose-Capillary: 126 mg/dL — ABNORMAL HIGH (ref 70–99)
Glucose-Capillary: 145 mg/dL — ABNORMAL HIGH (ref 70–99)
Glucose-Capillary: 150 mg/dL — ABNORMAL HIGH (ref 70–99)
Glucose-Capillary: 159 mg/dL — ABNORMAL HIGH (ref 70–99)
Glucose-Capillary: 168 mg/dL — ABNORMAL HIGH (ref 70–99)
Glucose-Capillary: 201 mg/dL — ABNORMAL HIGH (ref 70–99)
Glucose-Capillary: 349 mg/dL — ABNORMAL HIGH (ref 70–99)
Glucose-Capillary: 358 mg/dL — ABNORMAL HIGH (ref 70–99)
Glucose-Capillary: 72 mg/dL (ref 70–99)
Glucose-Capillary: 93 mg/dL (ref 70–99)

## 2023-11-18 LAB — COMPREHENSIVE METABOLIC PANEL WITH GFR
ALT: 20 U/L (ref 0–44)
ALT: 21 U/L (ref 0–44)
AST: 48 U/L — ABNORMAL HIGH (ref 15–41)
AST: 47 U/L — ABNORMAL HIGH (ref 15–41)
Albumin: 1.8 g/dL — ABNORMAL LOW (ref 3.5–5.0)
Albumin: 1.6 g/dL — ABNORMAL LOW (ref 3.5–5.0)
Alkaline Phosphatase: 105 U/L (ref 38–126)
Alkaline Phosphatase: 104 U/L (ref 38–126)
Anion gap: 14 (ref 5–15)
Anion gap: 11 (ref 5–15)
BUN: 15 mg/dL (ref 6–20)
BUN: 14 mg/dL (ref 6–20)
CO2: 17 mmol/L — ABNORMAL LOW (ref 22–32)
CO2: 19 mmol/L — ABNORMAL LOW (ref 22–32)
Calcium: 6.3 mg/dL — CL (ref 8.9–10.3)
Calcium: 6.2 mg/dL — CL (ref 8.9–10.3)
Chloride: 102 mmol/L (ref 98–111)
Chloride: 103 mmol/L (ref 98–111)
Creatinine, Ser: 1.74 mg/dL — ABNORMAL HIGH (ref 0.44–1.00)
Creatinine, Ser: 1.72 mg/dL — ABNORMAL HIGH (ref 0.44–1.00)
GFR, Estimated: 42 mL/min — ABNORMAL LOW (ref >60)
GFR, Estimated: 42 mL/min — ABNORMAL LOW (ref >60)
Glucose, Bld: 327 mg/dL — ABNORMAL HIGH (ref 70–99)
Glucose, Bld: 372 mg/dL — ABNORMAL HIGH (ref 70–99)
Potassium: 3.4 mmol/L — ABNORMAL LOW (ref 3.5–5.1)
Potassium: 3.4 mmol/L — ABNORMAL LOW (ref 3.5–5.1)
Sodium: 133 mmol/L — ABNORMAL LOW (ref 135–145)
Sodium: 133 mmol/L — ABNORMAL LOW (ref 135–145)
Total Bilirubin: 0.9 mg/dL (ref 0.0–1.2)
Total Bilirubin: 0.4 mg/dL (ref 0.0–1.2)
Total Protein: 5.6 g/dL — ABNORMAL LOW (ref 6.5–8.1)
Total Protein: 5.4 g/dL — ABNORMAL LOW (ref 6.5–8.1)

## 2023-11-18 LAB — CBC
HCT: 30.9 % — ABNORMAL LOW (ref 36.0–46.0)
HCT: 29.2 % — ABNORMAL LOW (ref 36.0–46.0)
Hemoglobin: 10.1 g/dL — ABNORMAL LOW (ref 12.0–15.0)
Hemoglobin: 9.6 g/dL — ABNORMAL LOW (ref 12.0–15.0)
MCH: 25.8 pg — ABNORMAL LOW (ref 26.0–34.0)
MCH: 25.9 pg — ABNORMAL LOW (ref 26.0–34.0)
MCHC: 32.7 g/dL (ref 30.0–36.0)
MCHC: 32.9 g/dL (ref 30.0–36.0)
MCV: 79 fL — ABNORMAL LOW (ref 80.0–100.0)
MCV: 78.7 fL — ABNORMAL LOW (ref 80.0–100.0)
Platelets: 200 K/uL (ref 150–400)
Platelets: 214 K/uL (ref 150–400)
RBC: 3.91 MIL/uL (ref 3.87–5.11)
RBC: 3.71 MIL/uL — ABNORMAL LOW (ref 3.87–5.11)
RDW: 15.2 % (ref 11.5–15.5)
RDW: 15 % (ref 11.5–15.5)
WBC: 26.8 K/uL — ABNORMAL HIGH (ref 4.0–10.5)
WBC: 22.4 K/uL — ABNORMAL HIGH (ref 4.0–10.5)
nRBC: 0 % (ref 0.0–0.2)
nRBC: 0 % (ref 0.0–0.2)

## 2023-11-18 MED ORDER — CALCIUM CARBONATE 1250 (500 CA) MG PO TABS
1250.0000 mg | ORAL_TABLET | Freq: Three times a day (TID) | ORAL | Status: DC
  Administered 2023-11-18 – 2023-11-20 (×6): 1250 mg via ORAL
  Filled 2023-11-18 (×7): qty 1

## 2023-11-18 MED ORDER — COCONUT OIL OIL
1.0000 | TOPICAL_OIL | Status: DC | PRN
  Administered 2023-11-18: 1 via TOPICAL

## 2023-11-18 MED ORDER — OXYCODONE HCL 5 MG PO TABS
5.0000 mg | ORAL_TABLET | ORAL | Status: DC | PRN
  Administered 2023-11-18: 5 mg via ORAL
  Filled 2023-11-18: qty 1

## 2023-11-18 MED ORDER — SIMETHICONE 80 MG PO CHEW: 80.0000 mg | CHEWABLE_TABLET | ORAL | Status: DC | PRN

## 2023-11-18 MED ORDER — WITCH HAZEL-GLYCERIN EX PADS: 1.0000 | MEDICATED_PAD | CUTANEOUS | Status: DC | PRN

## 2023-11-18 MED ORDER — TRANEXAMIC ACID-NACL 1000-0.7 MG/100ML-% IV SOLN
INTRAVENOUS | Status: AC
  Filled 2023-11-18: qty 100

## 2023-11-18 MED ORDER — TRANEXAMIC ACID-NACL 1000-0.7 MG/100ML-% IV SOLN
1000.0000 mg | Freq: Once | INTRAVENOUS | Status: AC
  Administered 2023-11-18: 1000 mg via INTRAVENOUS

## 2023-11-18 MED ORDER — ONDANSETRON HCL 4 MG/2ML IJ SOLN: 4.0000 mg | INTRAMUSCULAR | Status: DC | PRN

## 2023-11-18 MED ORDER — TETANUS-DIPHTH-ACELL PERTUSSIS 5-2-15.5 LF-MCG/0.5 IM SUSP
0.5000 mL | Freq: Once | INTRAMUSCULAR | Status: DC
Start: 2023-11-19 — End: 2023-11-18

## 2023-11-18 MED ORDER — AMITRIPTYLINE HCL 25 MG PO TABS
25.0000 mg | ORAL_TABLET | Freq: Every day | ORAL | Status: DC
  Administered 2023-11-18 – 2023-11-19 (×2): 25 mg via ORAL
  Filled 2023-11-18 (×2): qty 1

## 2023-11-18 MED ORDER — ACETAMINOPHEN 325 MG PO TABS
650.0000 mg | ORAL_TABLET | ORAL | Status: DC | PRN
  Administered 2023-11-18 – 2023-11-19 (×3): 650 mg via ORAL
  Filled 2023-11-18 (×3): qty 2

## 2023-11-18 MED ORDER — DIBUCAINE (PERIANAL) 1 % EX OINT: 1.0000 | TOPICAL_OINTMENT | CUTANEOUS | Status: DC | PRN

## 2023-11-18 MED ORDER — DIPHENHYDRAMINE HCL 25 MG PO CAPS: 25.0000 mg | ORAL_CAPSULE | Freq: Four times a day (QID) | ORAL | Status: DC | PRN

## 2023-11-18 MED ORDER — LEVOTHYROXINE SODIUM 100 MCG PO TABS
200.0000 ug | ORAL_TABLET | Freq: Every day | ORAL | Status: DC
  Administered 2023-11-18 – 2023-11-20 (×3): 200 ug via ORAL
  Filled 2023-11-18: qty 8
  Filled 2023-11-18: qty 2
  Filled 2023-11-18: qty 8
  Filled 2023-11-18: qty 2
  Filled 2023-11-18: qty 8
  Filled 2023-11-18: qty 2
  Filled 2023-11-18: qty 8

## 2023-11-18 MED ORDER — SENNOSIDES-DOCUSATE SODIUM 8.6-50 MG PO TABS
2.0000 | ORAL_TABLET | Freq: Every day | ORAL | Status: DC
  Administered 2023-11-20: 2 via ORAL
  Filled 2023-11-18 (×2): qty 2

## 2023-11-18 MED ORDER — SODIUM CHLORIDE 0.9 % IV SOLN: INTRAVENOUS | Status: AC

## 2023-11-18 MED ORDER — OXYCODONE HCL 5 MG PO TABS
10.0000 mg | ORAL_TABLET | ORAL | Status: DC | PRN
  Administered 2023-11-18: 10 mg via ORAL
  Filled 2023-11-18: qty 2

## 2023-11-18 MED ORDER — BENZOCAINE-MENTHOL 20-0.5 % EX AERO: 1.0000 | INHALATION_SPRAY | CUTANEOUS | Status: DC | PRN

## 2023-11-18 MED ORDER — ONDANSETRON HCL 4 MG PO TABS: 4.0000 mg | ORAL_TABLET | ORAL | Status: DC | PRN

## 2023-11-18 MED ORDER — CALCITRIOL 0.5 MCG PO CAPS
0.5000 ug | ORAL_CAPSULE | Freq: Every day | ORAL | Status: DC
  Administered 2023-11-18 – 2023-11-20 (×3): 0.5 ug via ORAL
  Filled 2023-11-18 (×3): qty 1

## 2023-11-18 MED ORDER — INSULIN PUMP
Freq: Three times a day (TID) | SUBCUTANEOUS | Status: DC
  Administered 2023-11-18: 4.8 via SUBCUTANEOUS
  Administered 2023-11-18: 7.57 via SUBCUTANEOUS
  Filled 2023-11-18: qty 1

## 2023-11-18 NOTE — Inpatient Diabetes Management (Addendum)
 Inpatient Diabetes Program Recommendations  AACE/ADA: New Consensus Statement on Inpatient Glycemic Control (2015)  Target Ranges:  Prepandial:   less than 140 mg/dL      Peak postprandial:   less than 180 mg/dL (1-2 hours)      Critically ill patients:  140 - 180 mg/dL   Lab Results  Component Value Date   GLUCAP 168 (H) 11/18/2023   HGBA1C 6.7 (H) 09/15/2023    Review of Glycemic Control  Latest Reference Range & Units 11/18/23 08:14  Glucose 70 - 99 mg/dL 672 (H)  (H): Data is abnormally high  Diabetes history: DM1 Outpatient Diabetes medications: Insulin  pump and Dexcom G7 Current orders for Inpatient glycemic control: Insulin  pump and Dexcom  Met with patient at bedside.  IV insulin  was discontinued around 0615 this am.  Insulin  pump was not applied until 09:19 this morning.  Pump has been adjusted to pre-pregnancy settings per pt.  Serum glucose at 08:14 was 327.  Patient bolused 7.57 units with her pump at 09:20.  Dexcom has 20 more minutes to warm up.  Will continue to follow.     Addendum@14 :40:   Latest Reference Range & Units 11/18/23 10:36 11/18/23 11:56  Glucose-Capillary 70 - 99 mg/dL 650 (H) 641 (H)  (H): Data is abnormally high   Met with pt at bedside.  Glucose is slowly trending down.  Dexcom reading is 300 mg/dL @ 85:64.  Reached out to [employer] and she is recommending increasing her basal settings by 0.5 units/hr.  Notified Dr. Cleatus and agrees with her recommendations.  Will notify patient and watch her adjust her pump and ensure she is in auto mode.  Delon Emms will see patient tomorrow.    Current Basal settings 0000--1.75 0300--1.6 0600--1.65 0800--1.7 1500--1.75 2100--1.9 Total basal--41.45/day  She also changed from manual mode to auto mode.    Wyvonna Pinal, MSN, CDCES Diabetes Coordinator Inpatient Diabetes Program [phone] (team pager from 8a-5p)

## 2023-11-18 NOTE — Anesthesia Postprocedure Evaluation (Signed)
 Anesthesia Post Note  Patient: Cassandra [name]  Procedure(s) Performed: AN AD HOC LABOR EPIDURAL     Patient location during evaluation: Mother Baby Anesthesia Type: Epidural Level of consciousness: awake and alert and oriented Pain management: satisfactory to patient Vital Signs Assessment: post-procedure vital signs reviewed and stable Respiratory status: respiratory function stable Cardiovascular status: stable Postop Assessment: no headache, no backache, epidural receding, patient able to bend at knees, no signs of nausea or vomiting, adequate PO intake and able to ambulate Anesthetic complications: no   No notable events documented.  Last Vitals:  Vitals:   11/18/23 0844 11/18/23 1201  BP: (!) 114/58 129/79  Pulse: 80 90  Resp: 17 20  Temp: 36.9 C 36.6 C  SpO2: 97% 98%    Last Pain:  Vitals:   11/18/23 1337  TempSrc:   PainSc: 7    Pain Goal: Patients Stated Pain Goal: 3 (11/15/23 0820)                 PURNELL PORTAL

## 2023-11-18 NOTE — Discharge Summary (Signed)
 Postpartum Discharge Summary    Patient Name: Cassandra [name] DOB: November 30, 2000 MRN: [id number]  Date of admission: 11/10/2023 Delivery date:11/18/2023 Delivering provider: LORELI IHA D Date of discharge: 11/20/2023  Admitting diagnosis: Type 1 diabetes mellitus during pregnancy [O24.019] Intrauterine pregnancy: [redacted]w[redacted]d     Secondary diagnosis:  Principal Problem:   Type 1 diabetes mellitus during pregnancy Active Problems:   Hypertension   Post-surgical hypothyroidism   Post-surgical hypoparathyroidism   Rh negative state in antepartum period   AKI (acute kidney injury)  Additional problems: none    Discharge diagnosis: Preterm Pregnancy Delivered, CHTN, and T1DM                                              Post partum procedures: s/p rhogam Augmentation: AROM, Pitocin, Cytotec, and IP Foley Complications: Intrauterine Inflammation or infection (Chorioamniotis) and ROM>24 hours  Hospital course: Induction of Labor With Vaginal Delivery   23 y.o. yo G2P0111 at [redacted]w[redacted]d was admitted to the hospital 11/10/2023 for induction of labor.  Indication for induction: uncontrolled T1DM.  Patient had an induction lasting a little more than 50 hours; she was dx with Triple I and was on the endotool with improved blood sugar control. She pushed just under 3 hours and then was agreeable to a vacuum delivery due to exhaustion and decreased FHR variability/increased FHR tachycardia. Membrane Rupture Time/Date: 1:36 AM,11/17/2023  Delivery Method:Vaginal, Vacuum (Extractor) Operative Delivery:Device used:Kiwi Indication: Maternal exhaustion Episiotomy: None Lacerations:  Vaginal Details of delivery can be found in separate delivery note.  Patient had a postpartum course complicated by: - T1DM - DM coordinator was involved in patient care. Her blood sugars were reasonably controlled with new pump settings. Correction factor was liberalized on day of discharge. She will follow up with  endocrinology - cHTN - no medications during pregnancy. She had 1 elevated PP blood pressure but significant bilateral pitting edema to the knees. She will receive lasix 40mg  x 5d with 40 PO K daily. She will follow up with endocrinology - AKI - NSAIDs avoided. Cr improved to 1.05 prior to discharge.  Patient is discharged home 11/20/23.  Newborn Data: Birth date:11/18/2023 Birth time:2:54 AM Gender:Female Living status:Living Apgars:2 ,6  Weight:2670 g (5lb 14.2oz)  Magnesium  Sulfate received: No BMZ received: No Rhophylac :Yes  MMR:N/A T-DaP:Given prenatally Flu: No RSV Vaccine received: Yes Transfusion:No  Immunizations received: Immunization History  Administered Date(s) Administered    sv, Bivalent, Protein Subunit Rsvpref,pf (Abrysvo) 10/28/2023   Fluzone Influenza virus vaccine,trivalent (IIV3), split virus 11/20/2010, 12/02/2011, 04/24/2014, 01/14/2016   HIB (PRP-OMP) 06/02/2000, 08/10/2000, 10/12/2000, 04/12/2001   HPV Quadrivalent 09/25/2011, 09/30/2012, 09/12/2013   Hepatitis A, Ped/Adol-2 Dose 01/20/2011, 09/25/2011   Hepatitis B, PED/ADOLESCENT 27-Feb-2000, 06/02/2000, 10/12/2000   Hpv-Unspecified 09/25/2011   IPV 06/02/2000, 08/10/2000, 04/12/2001, 04/17/2005   Influenza Inj Mdck Quad With Preservative 04/24/2014   Influenza, Seasonal, Injecte, Preservative Fre 04/25/2021   Influenza,inj,Quad PF,6+ Mos 01/07/2017, 11/03/2017, 10/19/2018, 02/24/2019, 11/14/2019   MMR 04/12/2001, 04/17/2005   Meningococcal Conjugate 09/25/2011, 03/17/2017   PFIZER Comirnaty(Gray Top)Covid-19 Tri-Sucrose Vaccine 04/28/2021   Pneumococcal Polysaccharide-23 03/17/2017   Tdap 01/20/2011, 08/09/2020, 09/22/2023   Varicella 04/12/2001, 04/17/2005    Physical exam  Vitals:   11/19/23 1129 11/19/23 1636 11/19/23 2016 11/20/23 0611  BP: (!) 113/58 131/81 (!) 144/87 120/63  Pulse: 80 69 75 68  Resp: 17 19 20 18   Temp:  97.8 F (36.6 C) 97.7 F (36.5 C) 97.8 F (36.6 C) 97.9 F (36.6  C)  TempSrc: Oral Oral Oral Oral  SpO2: 97% 99% 100% 94%  Weight:      Height:       General: alert, cooperative, and no distress Lochia: appropriate Uterine Fundus: firm Incision: N/A DVT Evaluation: No evidence of DVT seen on physical exam. Labs: Lab Results  Component Value Date   WBC 22.4 (H) 11/18/2023   HGB 9.6 (L) 11/18/2023   HCT 29.2 (L) 11/18/2023   MCV 78.7 (L) 11/18/2023   PLT 214 11/18/2023      Latest Ref Rng & Units 11/19/2023    5:07 AM  CMP  Glucose 70 - 99 mg/dL 854   BUN 6 - 20 mg/dL 12   Creatinine 9.55 - 1.00 mg/dL 8.94   Sodium 864 - 854 mmol/L 136   Potassium 3.5 - 5.1 mmol/L 3.1   Chloride 98 - 111 mmol/L 106   CO2 22 - 32 mmol/L 21   Calcium  8.9 - 10.3 mg/dL 6.6   Total Protein 6.5 - 8.1 g/dL 5.1   Total Bilirubin 0.0 - 1.2 mg/dL 0.4   Alkaline Phos 38 - 126 U/L 86   AST 15 - 41 U/L 35   ALT 0 - 44 U/L 20    Edinburgh Score:    11/19/2023   12:22 AM  Edinburgh Postnatal Depression Scale Screening Tool  I have been able to laugh and see the funny side of things. 0  I have looked forward with enjoyment to things. 0  I have blamed myself unnecessarily when things went wrong. 1  I have been anxious or worried for no good reason. 0  I have felt scared or panicky for no good reason. 0  Things have been getting on top of me. 1  I have been so unhappy that I have had difficulty sleeping. 1  I have felt sad or miserable. 0  I have been so unhappy that I have been crying. 0  The thought of harming myself has occurred to me. 0  Edinburgh Postnatal Depression Scale Total 3   Edinburgh Postnatal Depression Scale Total: 3   After visit meds:  Allergies as of 11/20/2023       Reactions   Shellfish Protein-containing Drug Products Diarrhea, Nausea And Vomiting   Tape Itching   Medical tape causes itching   Amoxicillin-pot Clavulanate Nausea And Vomiting, Other (See Comments)   Lactose Intolerance (gi)    Shellfish Allergy Nausea And  Vomiting, Other (See Comments)   Tilactase Nausea And Vomiting   Latex Rash        Medication List     STOP taking these medications    aspirin  EC 81 MG tablet       TAKE these medications    Accu-Chek Guide w/Device Kit 1 kit by Does not apply route daily as needed.   acetaminophen  500 MG tablet Commonly known as: TYLENOL  Take 2 tablets (1,000 mg total) by mouth every 6 (six) hours as needed.   Adhesive Remover Wipes Misc Use as directed for pump and CGM sites.   amitriptyline  75 MG tablet Commonly known as: ELAVIL  Take 1 tablet (75 mg total) by mouth at bedtime. What changed: how much to take   Baqsimi  Two Pack 3 MG/DOSE Powd Generic drug: Glucagon  Place 1 each into the nose as needed (severe hypoglycmia with unresponsiveness).   calcitRIOL  0.5 MCG capsule Commonly known as: ROCALTROL  Take 0.5 mcg  by mouth daily.   calcium  carbonate 1500 (600 Ca) MG Tabs tablet Commonly known as: OSCAL Take 1,500 mg by mouth in the morning, at noon, and at bedtime.   citalopram  10 MG tablet Commonly known as: CELEXA  Take 10 mg by mouth daily.   Dexcom G6 Receiver Devi 1 Device by Does not apply route daily as needed.   Dexcom G6 Sensor Misc CHANGE SENSOR EVERY 10 DAYS AS DIRECTED.   Dexcom G6 Transmitter Misc 1 kit by Does not apply route daily as needed.   famotidine  20 MG tablet Commonly known as: Pepcid  Take 1 tablet (20 mg total) by mouth 2 (two) times daily.   fluticasone  50 MCG/ACT nasal spray Commonly known as: FLONASE  Place 2 sprays into both nostrils daily.   furosemide 40 MG tablet Commonly known as: LASIX Take 1 tablet (40 mg total) by mouth daily.   insulin  lispro 100 UNIT/ML injection Commonly known as: HumaLOG  INJECT UP TO 300 UNITS IN INSULIN  PUMP EVERY 48 HOURS, PER DKA AND HYPERGLYCEMIA PROTOCOLS What changed: Another medication with the same name was removed. Continue taking this medication, and follow the directions you see here.    INSULIN  SYRINGE .3CC/29GX1/2 29G X 1/2 0.3 ML Misc 1 each by Does not apply route 3 (three) times daily.   levothyroxine  200 MCG tablet Commonly known as: SYNTHROID  Take 200 mcg by mouth daily.   Melatonin 1 MG Caps Take 10 mg by mouth at bedtime.   multivitamin-prenatal 27-0.8 MG Tabs tablet Take 1 tablet by mouth daily at 12 noon.   nystatin  powder Commonly known as: nystatin  Apply 1 Application topically 3 (three) times daily.   potassium chloride  SA 20 MEQ tablet Commonly known as: KLOR-CON  M Take 2 tablets (40 mEq total) by mouth daily.       Discharge home in stable condition Infant Feeding: Breast Infant Disposition:NICU Discharge instruction: per After Visit Summary and Postpartum booklet. Activity: Advance as tolerated. Pelvic rest for 6 weeks.  Diet: carb modified diet Future Appointments:No future appointments. Follow up Visit:  Follow-up Information     [facility] for [facility] Follow up in 1 week(s).   Specialty: Obstetrics and Gynecology Why: For blood pressure check For routine postpartum care in 6 weeks Contact information: [address] [place]  [address] [phone]                Loreli Suzen BIRCH, CNM  P [facility] Support Pool Please schedule this patient for Postpartum visit in: 6 weeks with the following provider: Any provider In-Person For C/S patients schedule nurse incision check in weeks 2 weeks: no High risk pregnancy complicated by: T1DM, cHTN, Rh neg Delivery mode:  Vacuum Anticipated Birth Control:  other/unsure PP Procedures needed: RN BP check in 1-2wks Schedule Integrated BH visit: no  11/20/2023 Kieth JAYSON Carolin, MD

## 2023-11-18 NOTE — Lactation Note (Signed)
 This note was copied from a baby's chart.  NICU Lactation Consultation Note  Patient Name: Boy Karna Abed Unijb'd Date: 11/18/2023 Age:23 hours  Reason for consult: Maternal endocrine disorder; Initial assessment; Primapara; 1st time breastfeeding; NICU baby; Late-preterm 34-36.6wks; Infant < 6lbs Type of Endocrine Disorder?: Diabetes; Thyroid  (T1DM, Hashimoto (hypothyroid on synthroid ))  SUBJECTIVE Visited with family of 23 18/94 weeks old NICU female; baby Tereasa got admitted due to vacuum assisted vaginal delivery at [redacted]w[redacted]d with respiratory failure requiring intubation in delivery room. Consepcion is a P1 and reported her plan is to do both, direct breast feeding along with pumping and bottle feeding with breastmilk/formula. Provided a pumping band in size L for hands on pumping but unable to do the fitting due to IV still running.   [facility] RN Jerel has already set up a DEBP but Azarie wishes to take a nap first before starting pumping she was very tired. Let her know that the purpose of pumping this early on is mainly for breast stimulation and not to get volume. Reviewed pumping schedule, pumping log, secretory activation, lactogenesis III, CDC and anticipatory guidelines. Discussed getting inserts for her Spectra  pump at home.  OBJECTIVE Infant data: Mother's Current Feeding Choice: -- (NPO)  O2 Device: Ventilator FiO2 (%): 21 %   Maternal data: [id number] Vaginal, Vacuum (Extractor) Has patient been taught Hand Expression?: Yes Hand Expression Comments: colostrum noted Significant Breast History:: (+) breast changes during the pregnancy Current breast feeding challenges:: NICU admission Does the patient have breastfeeding experience prior to this delivery?: No Pumping frequency: DEBP was set up at 11 hours post-partum Flange Size: 18 Hands-free pumping top sizes: Large Alejos) Risk factor for low/delayed milk supply:: primipara, T1DM, hypothyroid, prematurity, infant  separation  Pump: Hands Free, Personal, DEBP (Spectra  S2 and wearable pump at home)  ASSESSMENT Infant: Feeding Status: NPO  Maternal: Milk volume: Normal  INTERVENTIONS/PLAN Interventions: Interventions: Breast feeding basics reviewed; Coconut oil; DEBP; Education; [employer] brochure; CDC milk storage guidelines; CDC Guidelines for Breast Pump Cleaning; NICU Pumping Log Tools: Pump; Flanges; Coconut oil; Hands-free pumping top Pump Education: Setup, frequency, and cleaning; Milk Storage  Plan: STS once able to Breast massage, hand expression and coconut oil prior to pumping Pump both breast on initiate mode every 3 hours for 15 minutes, ideally 8 pumping sessions/24 hours  FOB present and supportive. All questions and concerns answered, family to contact [facility] services PRN.  Consult Status: NICU follow-up NICU Follow-up type: New admission follow up   Venus Ruhe S Miriam 11/18/2023, 3:55 PM

## 2023-11-18 NOTE — Progress Notes (Signed)
 Latest Reference Range & Units 11/18/23 08:14  Calcium  8.9 - 10.3 mg/dL 6.3 (LL)  (LL): Data is critically low   Dr. Cleatus made aware of critical value. No new orders given. VEVA LITTIE POD, RN

## 2023-11-19 ENCOUNTER — Other Ambulatory Visit

## 2023-11-19 LAB — COMPREHENSIVE METABOLIC PANEL WITH GFR
ALT: 20 U/L (ref 0–44)
AST: 35 U/L (ref 15–41)
Albumin: 1.6 g/dL — ABNORMAL LOW (ref 3.5–5.0)
Alkaline Phosphatase: 86 U/L (ref 38–126)
Anion gap: 9 (ref 5–15)
BUN: 12 mg/dL (ref 6–20)
CO2: 21 mmol/L — ABNORMAL LOW (ref 22–32)
Calcium: 6.6 mg/dL — ABNORMAL LOW (ref 8.9–10.3)
Chloride: 106 mmol/L (ref 98–111)
Creatinine, Ser: 1.05 mg/dL — ABNORMAL HIGH (ref 0.44–1.00)
GFR, Estimated: >60 mL/min (ref >60)
Glucose, Bld: 145 mg/dL — ABNORMAL HIGH (ref 70–99)
Potassium: 3.1 mmol/L — ABNORMAL LOW (ref 3.5–5.1)
Sodium: 136 mmol/L (ref 135–145)
Total Bilirubin: 0.4 mg/dL (ref 0.0–1.2)
Total Protein: 5.1 g/dL — ABNORMAL LOW (ref 6.5–8.1)

## 2023-11-19 LAB — GLUCOSE, CAPILLARY
Glucose-Capillary: 142 mg/dL — ABNORMAL HIGH (ref 70–99)
Glucose-Capillary: 80 mg/dL (ref 70–99)

## 2023-11-19 MED ORDER — RHO D IMMUNE GLOBULIN 1500 UNIT/2ML IJ SOSY
300.0000 ug | PREFILLED_SYRINGE | Freq: Once | INTRAMUSCULAR | Status: DC
  Filled 2023-11-19: qty 2

## 2023-11-19 MED ORDER — RHO D IMMUNE GLOBULIN 1500 UNIT/2ML IJ SOSY
300.0000 ug | PREFILLED_SYRINGE | Freq: Once | INTRAMUSCULAR | Status: AC
  Administered 2023-11-19: 300 ug via INTRAVENOUS
  Filled 2023-11-19: qty 2

## 2023-11-19 MED ORDER — FUROSEMIDE 20 MG PO TABS
20.0000 mg | ORAL_TABLET | Freq: Every day | ORAL | Status: DC
  Administered 2023-11-19 – 2023-11-20 (×2): 20 mg via ORAL
  Filled 2023-11-19 (×3): qty 1

## 2023-11-19 NOTE — Progress Notes (Addendum)
 POSTPARTUM PROGRESS NOTE  Subjective: 23 y.o. G2P0111 who is PPD1 s/p VAVD  Course complicated by: T1DM, CHTN, hypothyroidism, hypocalcemia  Doing well this morning, no concerns. Mild bleeding and cramping.  Objective: Blood pressure 110/71, pulse 87, temperature 97.7 F (36.5 C), temperature source Oral, resp. rate 17, height 5' 1 (1.549 m), weight 98 kg, last menstrual period 03/15/2023, SpO2 100%, unknown if currently breastfeeding.  Physical Exam:  General: alert, cooperative, and no distress Abdomen: soft, nontender nondistended Uterine Fundus: firm Lochia: appropriate Incision: n/a Lower extremities: Calf/Ankle edema is present.  Recent Labs    11/18/23 0814 11/18/23 1233  HGB 10.1* 9.6*  HCT 30.9* 29.2*    Assessment/Plan: Postpartum - Contraception: TBD - MOF: breast/formula - Rh status: A NEG - baby is RH pos, rhogam workup - Rubella status: immune - Consults: lactation  Neonatal: in NICU, extubated yesterday, doing well, plans circumcision  3. T1DM: on pump, diabetes team following, initial hyperglycemia transitioning from endotool to her pump when there was a lapse in time with no insulin , now improved  4. CHTN: Bps within normal limits, given lower extremity swelling, will start lasix  5. Hypocalcemia: continue meds  6. Hypothyroidism: continue synthroid    7. Hx mood disorder: continue celexa , amitriptyline   8. AKI: creatinine improving  9. Chorioamnionitis: s/p treatment intrapartum, no antibiotics at this time   LOS: 9 days    Rollo ONEIDA Bring, MD, FACOG Obstetrician & Gynecologist, [facility] for [facility], [facility] 11/19/2023, 6:33 AM

## 2023-11-19 NOTE — Clinical Social Work Maternal (Signed)
 CLINICAL SOCIAL WORK MATERNAL/CHILD NOTE  Patient Details  Name: Cassandra [name] MRN: [id number] Date of Birth: 05/16/2000  Date:  Feb 26, 2023  Clinical Social Worker Initiating Note:  Nat Quiet, [place] Date/Time: Initiated:  11/19/23/1352     Child's Name:  Fpxjy McClenic   Biological Parents:  Mother Rafferty Postlewait 04/05/2000, Father Ryan McClenic 04/07/1996   Need for Interpreter:  None   Reason for Referral:  Parental Support of Premature Babies < 32 weeks/or Critically Ill babies   Address:  [address] [place] [address]    Phone number:  [phone] (home)     Additional phone number:   Household Members/Support Persons (HM/SP):   Household Member/Support Person 1   HM/SP Name Relationship DOB or Age  HM/SP -1 Bernardino Gee McClenic Significant Other 04/07/1996  HM/SP -2        HM/SP -3        HM/SP -4        HM/SP -5        HM/SP -6        HM/SP -7        HM/SP -8          Natural Supports (not living in the home):  Parent, Immediate Family, Extended Family   Professional Supports: Paramedic, Other (Comment) (Psychiatrist: Biomedical engineer at [employer])   Employment: Unemployed (MOB employed: Printmaker)   Type of Work:     Education:  Engineer, agricultural   Homebound arranged:    Surveyor, quantity Resources:  Medicaid   Other Resources:  [facility]   Cultural/Religious Considerations Which May Impact Care:    Strengths:  Ability to meet basic needs  , Home prepared for child  , Psychotropic Medications   Psychotropic Medications:  Celexa, Other meds (Amitriptyline)      Pediatrician:       Pediatrician List:   Radiographer, therapeutic  [facility]  [place]    [facility]      Pediatrician Fax Number:    Risk Factors/Current Problems:  Mental Health Concerns     Cognitive State:  Able to Concentrate     Mood/Affect:  Calm  , Comfortable   , Interested     CSW Assessment: CSW received consult for hx of Anxiety, Depression and NICU admission.  CSW met with MOB to offer support and complete assessment.    CSW met with MOB at bedside and introduced CSW role. MOB presented calm, pleasant and engaged during the visit. FOB and paternal grandmother were also present. MOB confirmed that her demographic information on hospital file was correct. MOB reported that she is unemployed and FOB is employed at [employer]. She receives [facility] benefits and confirmed that she has all essential items to care for her including a car seat and crib. MOB reported that they had chosen [facility] for infant's follow up care and confirmed she had transportation. CSW inquired about MOB's support system. MOB identified FOB, maternal grandmother and paternal grandmother and step-paternal grandmother as her primary sources of support.CSW inquired if the NICU staff had been keeping parents well informed about their infant's care. Parents reported that they felt well-informed by staff. MOB informed parents about NICU support services and offered to check in to offer support. MOB was receptive to the visit.   CSW asked FOB and paternal grandmother if they could step out of  the room to allow MOB privacy. Both FOB and paternal grandmother left the room to allow MOB privacy.   CSW asked MOB how she had been feeling. MOB shared that she had been feeling good and explained that her labor and delivery experience was long, lasting two days.CSW acknowledged MOB's experience and inquired about MOB's mental health history. MOB reported that has a history of depression and anxiety, which she currently manages with mental health medications Celexa and Amtriptyline through her psychiatrists Doctor, hospital) with [employer]. MOB stated that she will continue to take her medications during the postpartum period and will contact her psychiatrist if  concerns arise. MOB reported that she has access to a therapist at [facility] when she decides to pursue therapy services.  CSW provided education regarding the baby blues period vs. perinatal mood disorders, discussed treatment encouraged her to continue her current treatment plan. CSW recommended MOB complete a self-evaluation during the postpartum time period using the New Mom Checklist from Postpartum Progress and encouraged MOB to contact a medical professional if symptoms are noted at any time.  CSW assessed MOB for safety. MOB denied SI/HI and DV concerns.   CSW provided review of Sudden Infant Death Syndrome (SIDS) precautions. Both parents verbalized understanding.   CSW identifies no further need for intervention and no barriers to discharge at this time.  CSW Plan/Description:  Perinatal Mood and Anxiety Disorder (PMADs) Education, Sudden Infant Death Syndrome (SIDS) Education, No Further Intervention Required/No Barriers to Discharge    Nat DELENA Quiet, LCSW 15-Apr-2023, 2:00 PM

## 2023-11-19 NOTE — Lactation Note (Signed)
 This note was copied from a baby's chart.  NICU Lactation Consultation Note  Patient Name: Boy Aariyah Sampey Unijb'd Date: 11/19/2023 Age:23 hours  Reason for consult: Follow-up assessment; NICU baby; Primapara; 1st time breastfeeding; Maternal endocrine disorder; Late-preterm 34-36.6wks; Infant < 6lbs Type of Endocrine Disorder?: Thyroid ; Diabetes (Type 1 DM, poorly controlled, hypothyroidism)  SUBJECTIVE  LC in to visit with P1 Mom of LPTI in the NICU named Mikah.  He is currently NPO, but possibly to start gavage feedings today.    Mom hasn't started pumping yet due to her not feeling well.  Mom agreeable to [facility] assistance this morning.  LC noted that right breast is fuller and more dense than left breast.  Both breasts are firm and hard to compress, no colostrum noted with hand expression at present.  LC provided a hands free pumping top and assisted pumping on initiation setting   Mom encouraged to massage breasts while pumping and encouraged holding baby STS, always pumping after when hormones are stimulated.  Colostrum protectors explained and how to discard after 24 hrs.  LC reviewed importance of washing pumping parts, rinsing and air drying on clean paper towel.  Extra small bottles provided.  Mom to ask RN for milk labels from NICU RN, LC also messaged her today.  Mom encouraged to use the pumping log, as the goal would be to pump 8 times per 24 hrs.  Mom aware of lactation support available to her while baby is in the NICU, and encouraged to ask for help from [facility] prn.    OBJECTIVE Infant data: Mother's Current Feeding Choice: Breast Milk and Donor Milk  O2 Device: Room Air FiO2 (%): 21 %  Infant feeding assessment No data recorded  Maternal data: G2P0111 Vaginal, Vacuum (Extractor) Has patient been taught Hand Expression?: Yes Hand Expression Comments: colostrum noted Significant Breast History:: (+) breast changes during the pregnancy Current breast feeding  challenges:: NICU admission Does the patient have breastfeeding experience prior to this delivery?: No Pumping frequency: Initiated double pumping at 31 hrs post delivery, Mom encouraged to pump 8 times per 24 hrs Flange Size: 18 Hands-free pumping top sizes: Large Alejos) Risk factor for low/delayed milk supply:: primipara, T1DM, hypothyroid, prematurity, infant separation  Pump: Hands Free, Personal, DEBP (Spectra  from insurance and hands free pump)  ASSESSMENT Infant:  Feeding Status: NPO  Maternal: Milk volume: Normal  INTERVENTIONS/PLAN Interventions: Interventions: Breast feeding basics reviewed; Skin to skin; Hand express; Breast massage; DEBP; Coconut oil; Education; NICU Pumping Log Tools: Pump; Flanges; Coconut oil Pump Education: Setup, frequency, and cleaning; Milk Storage  Plan: Consult Status: NICU follow-up NICU Follow-up type: Verify onset of copious milk; Verify absence of engorgement; Maternal D/C visit   Claudene Aleck BRAVO 11/19/2023, 10:06 AM

## 2023-11-19 NOTE — Inpatient Diabetes Management (Signed)
 Inpatient Diabetes Program Recommendations  AACE/ADA: New Consensus Statement on Inpatient Glycemic Control (2015)  Target Ranges:  Prepandial:   less than 140 mg/dL      Peak postprandial:   less than 180 mg/dL (1-2 hours)      Critically ill patients:  140 - 180 mg/dL   Lab Results  Component Value Date   GLUCAP 80 11/19/2023   HGBA1C 6.7 (H) 09/15/2023    Review of Glycemic Control  Latest Reference Range & Units 11/18/23 17:57 11/18/23 19:29 11/18/23 19:47 11/18/23 23:37 11/19/23 05:02 11/19/23 08:56  Glucose-Capillary 70 - 99 mg/dL 854 (H) 72 93 840 (H) 857 (H) 80   Diabetes history: DM 1 Outpatient Diabetes medications:  T-Slim insulin  pump- Current Basal settings 0000--1.75 0300--1.6 0600--1.65 0800--1.7 1500--1.75 2100--1.9 Total basal--41.45/day Current orders for Inpatient glycemic control:  Insulin  pump-  Inpatient Diabetes Program Recommendations:    Blood sugars much improved this morning.    Thanks,  Randall Bullocks, RN, BC-ADM Inpatient Diabetes Coordinator Pager [phone]  (8a-5p)

## 2023-11-20 ENCOUNTER — Other Ambulatory Visit (HOSPITAL_COMMUNITY): Payer: Self-pay

## 2023-11-20 ENCOUNTER — Inpatient Hospital Stay (HOSPITAL_COMMUNITY): Admission: RE | Admit: 2023-11-20 | Source: Home / Self Care | Admitting: Family Medicine

## 2023-11-20 ENCOUNTER — Inpatient Hospital Stay (HOSPITAL_COMMUNITY)

## 2023-11-20 LAB — RH IG WORKUP (INCLUDES ABO/RH)
ABO/RH(D): A NEG
Antibody Screen: POSITIVE
Fetal Screen: NEGATIVE
Gestational Age(Wks): 35.5
Unit division: 0

## 2023-11-20 MED ORDER — INFLUENZA VIRUS VACC SPLIT PF (FLUZONE) 0.5 ML IM SUSY
0.5000 mL | PREFILLED_SYRINGE | Freq: Once | INTRAMUSCULAR | Status: AC
  Administered 2023-11-20: 0.5 mL via INTRAMUSCULAR
  Filled 2023-11-20: qty 0.5

## 2023-11-20 MED ORDER — FUROSEMIDE 40 MG PO TABS
40.0000 mg | ORAL_TABLET | Freq: Every day | ORAL | 0 refills | Status: DC
  Filled 2023-11-20: qty 5, 5d supply, fill #0

## 2023-11-20 MED ORDER — INFLUENZA VIRUS VACC SPLIT PF (FLUZONE) 0.5 ML IM SUSY: 0.5000 mL | PREFILLED_SYRINGE | INTRAMUSCULAR | Status: DC

## 2023-11-20 MED ORDER — ACETAMINOPHEN 500 MG PO TABS
1000.0000 mg | ORAL_TABLET | Freq: Four times a day (QID) | ORAL | 0 refills | Status: AC | PRN
  Filled 2023-11-20: qty 60, 8d supply, fill #0

## 2023-11-20 MED ORDER — POTASSIUM CHLORIDE CRYS ER 20 MEQ PO TBCR
40.0000 meq | EXTENDED_RELEASE_TABLET | Freq: Every day | ORAL | 1 refills | Status: DC
  Filled 2023-11-20: qty 30, 15d supply, fill #0

## 2023-11-20 MED ORDER — ACETAMINOPHEN-CAFFEINE 500-65 MG PO TABS
2.0000 | ORAL_TABLET | Freq: Three times a day (TID) | ORAL | Status: DC | PRN
  Administered 2023-11-20: 2 via ORAL
  Filled 2023-11-20 (×2): qty 2

## 2023-11-21 NOTE — Lactation Note (Signed)
 This note was copied from a baby's chart.  NICU Lactation Consultation Note  Patient Name: Cassandra [name] Date: 11/21/2023 Age:23 days  Reason for consult: Follow-up assessment; Primapara; 1st time breastfeeding; MD order; Late-preterm 34-36.6wks; Maternal endocrine disorder; Infant < 6lbs Type of Endocrine Disorder?: Diabetes; Thyroid  Poorly controlled T1DM and hypothyroidism SUBJECTIVE  LC in to visit with P1 Mom and FOB of baby Cassandra [name] in the NICU.  Baby being held clothed and over Mom's shirt.  Mom in recliner.  LC encouraged STS with baby to stimulate and support her milk supply.  Mom said she did STS with him yesterday.  Talked about the benefits to her milk supply.  LC provided a LER handout on increasing milk supply with a baby in the NICU.  Mom also said she was taking lactation pills.  They are fenugreek and milk thistle.  Mom was [facility] yesterday and spent the night at home where she slept all night without pumping.  She feels more refreshed today.  Mom has a Spectra  pump and FOB was online ordering smaller flanges.  Mom used her heated hands free pump last night and she didn't like it.  Encouraged Mom to use the Symphony with the 18 mm flanges as much as possible. Mom can pump every 2-3 hrs during the day.  LC provided another pumping band in a smaller size to try. LC encouraged Mom to increase her frequency of pumping as her breasts are changing today.  She expressed a couple ml to save this am.    Engorgement prevention and treatment reviewed. Mom encouraged to ask for lactation prn.  OBJECTIVE Infant data: No data recorded O2 Device: Room Air  Infant feeding assessment IDFTS - Readiness: 2 IDFTS - Quality: 3   Maternal data: [id number] Vaginal, Vacuum (Extractor) Pumping frequency: 6 times per 24 hrs Pumped volume: 3 mL Flange Size: 18 Hands-free pumping top sizes: Small/Medium (Blue)  Pump: Hands Free, Personal, DEBP (Spectra  from  insurance and hands free pump)  ASSESSMENT Infant:  Feeding Status: Scheduled 9-12-3-6 Feeding method: Bottle; Tube/Gavage (Bolus) Nipple Type: Nfant Extra Slow Flow (gold)  Maternal: Milk volume: Normal  INTERVENTIONS/PLAN Interventions: Interventions: Breast feeding basics reviewed; Skin to skin; Breast massage; Hand express; DEBP; Education Discharge Education: Engorgement and breast care Tools: Pump; Flanges; Hands-free pumping top; Bottle Pump Education: Setup, frequency, and cleaning; Milk Storage  Plan: Consult Status: NICU follow-up NICU Follow-up type: Verify onset of copious milk; Verify absence of engorgement   Claudene Aleck BRAVO 11/21/2023, 1:18 PM

## 2023-11-25 ENCOUNTER — Ambulatory Visit

## 2023-11-25 DIAGNOSIS — Z0130 Encounter for examination of blood pressure without abnormal findings: Secondary | ICD-10-CM | POA: Diagnosis not present

## 2023-11-25 NOTE — Progress Notes (Signed)
 Subjective:  Cassandra [name] is a H7E9888 here for a Postpartum BP check.  She is 1 weeks postpartum following a normal spontaneous vaginal delivery at 35 weeks 5 days. Pt reports scant vaginal bleeding with no concerns. Pt reports infant in NICU, doing well.   Hypertension ROS: Patient denies headache and visual changes   Objective:  LMP 03/15/2023 (Approximate)  VS WNL. BP 117/80, Pulse 92. Appearance alert, well appearing, and in no distress.   Assessment:   Blood Pressure well controlled and stable.   Plan:  Current treatment plan is effective, no change in therapy. RN reviewed when to notify provider and or go to MAU during the postpartum period.   Silvano LELON Piano, RN

## 2023-11-26 ENCOUNTER — Other Ambulatory Visit

## 2023-12-03 ENCOUNTER — Other Ambulatory Visit

## 2023-12-17 ENCOUNTER — Encounter (INDEPENDENT_AMBULATORY_CARE_PROVIDER_SITE_OTHER): Payer: Self-pay

## 2023-12-29 ENCOUNTER — Ambulatory Visit: Admitting: Obstetrics and Gynecology

## 2023-12-29 DIAGNOSIS — E892 Postprocedural hypoparathyroidism: Secondary | ICD-10-CM

## 2023-12-29 DIAGNOSIS — F3342 Major depressive disorder, recurrent, in full remission: Secondary | ICD-10-CM

## 2023-12-29 DIAGNOSIS — I10 Essential (primary) hypertension: Secondary | ICD-10-CM

## 2023-12-29 DIAGNOSIS — O24013 Pre-existing diabetes mellitus, type 1, in pregnancy, third trimester: Secondary | ICD-10-CM

## 2023-12-29 DIAGNOSIS — E890 Postprocedural hypothyroidism: Secondary | ICD-10-CM

## 2023-12-29 DIAGNOSIS — F411 Generalized anxiety disorder: Secondary | ICD-10-CM

## 2023-12-29 NOTE — Progress Notes (Signed)
 Post Partum Visit Note  Cassandra [name] is a 23 y.o. [phone] female who presents for a postpartum visit. She is 5 weeks postpartum following a normal spontaneous vaginal delivery.  I have fully reviewed the prenatal and intrapartum course. The delivery was at 35 gestational weeks.  Anesthesia: epidural. Postpartum course has been weeks. Baby is doing well. Baby is feeding by both breast and bottle - Enfamil Gentle ease. Bleeding no bleeding. Bowel function is normal. Bladder function is normal. Patient is not sexually active. Contraception method is none. Postpartum depression screening: negative.  Notes supply issues with breastfeeding.    Upstream - 12/29/23 1451       Pregnancy Intention Screening   Does the patient want to become pregnant in the next year? No    Does the patient's partner want to become pregnant in the next year? No    Would the patient like to discuss contraceptive options today? No      Contraception Wrap Up   Current Method No Contraceptive Precautions    End Method Female Condom;FAM or LAM    Contraception Counseling Provided No    How was the end contraceptive method provided? N/A         The pregnancy intention screening data noted above was reviewed. Potential methods of contraception were discussed. The patient elected to proceed with Female Condom; FAM or LAM.   Edinburgh Postnatal Depression Scale - 12/29/23 1445       Edinburgh Postnatal Depression Scale:  In the Past 7 Days   I have been able to laugh and see the funny side of things. 0    I have looked forward with enjoyment to things. 0    I have blamed myself unnecessarily when things went wrong. 0    I have been anxious or worried for no good reason. 2    I have felt scared or panicky for no good reason. 1    Things have been getting on top of me. 0    I have been so unhappy that I have had difficulty sleeping. 0    I have felt sad or miserable. 0    I have been so unhappy that I  have been crying. 0    The thought of harming myself has occurred to me. 0    Edinburgh Postnatal Depression Scale Total 3          Health Maintenance Due  Topic Date Due   OPHTHALMOLOGY EXAM  Never done   Meningococcal B Vaccine (1 of 2 - Standard) Never done   Pneumococcal Vaccine (2 of 2 - PCV) 03/17/2018   Diabetic kidney evaluation - Urine ACR  12/12/2022   FOOT EXAM  12/12/2022   COVID-19 Vaccine (1 - 2025-26 season) 10/04/2023    The following portions of the patient's history were reviewed and updated as appropriate: allergies, current medications, past family history, past medical history, past social history, past surgical history, and problem list.  Review of Systems Pertinent items are noted in HPI.  Objective:  BP 124/86   Pulse (!) 101   Ht 5' 1 (1.549 m)   Wt 194 lb (88 kg)   LMP 03/15/2023 (Approximate)   Breastfeeding Yes Comment: breastfeeding and bottle  BMI 36.66 kg/m    General:  alert, cooperative, and no distress   Breasts:  not indicated  Lungs: Normal effort  Heart:  Mild tachycardia   Abdomen: Soft, nont ender   GU exam:  Laceration well  healed and well approximated. 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  Mood and well being: Patient with negative depression screening today. Reviewed  local resources for support.  - Patient tobacco use? No.   - hx of drug use? No.    2. Infant care and feeding:  -Patient currently breastmilk feeding? Yes. Reviewed importance of draining breast regularly to support lactation. Will schedule with lactation consultant team in [place] -Social determinants of health (SDOH) reviewed in EPIC. No concerns  3. Sexuality, contraception and birth spacing - Patient does not want a pregnancy in the next year.  Desired family size is 2 children.  - Reviewed reproductive life planning. Reviewed contraceptive methods based on pt preferences and effectiveness.  Patient desired Abstinence, FAM or LAM, and Female Condom today.   - Discussed birth spacing of 18 months  4. Sleep and fatigue -Encouraged family/partner/community support of 4 hrs of uninterrupted sleep to help with mood and fatigue  5. Physical Recovery  - Discussed patients delivery and complications. She describes her labor as mixed.Overall great experience ended up needing a vacuum. Doesn't remember pushing.  - Patient had a VAVD. Patient had a vaginal laceration. Perineal healing reviewed. Patient expressed understanding - Patient has urinary incontinence? No. - Patient is safe to resume physical and sexual activity  6.  Health Maintenance - HM due items addressed Yes - Last pap smear  Diagnosis  Date Value Ref Range Status  09/30/2022   Final   - Negative for intraepithelial lesion or malignancy (NILM)   Pap smear not done at today's visit.  -Breast Cancer screening indicated? No.   7. Chronic Disease/Pregnancy Condition follow up: as above  Kieth JAYSON Carolin, MD Center for [employer], [facility]
